# Patient Record
Sex: Male | Born: 1950 | Race: White | Hispanic: No | Marital: Single | Smoking: Former smoker
Health system: Southern US, Community
[De-identification: ages and names within clinical notes are randomized; demographics above are authoritative.]

## PROBLEM LIST (undated history)

## (undated) DIAGNOSIS — I4891 Unspecified atrial fibrillation: Secondary | ICD-10-CM

## (undated) DIAGNOSIS — I745 Embolism and thrombosis of iliac artery: Secondary | ICD-10-CM

## (undated) DIAGNOSIS — IMO0001 Reserved for inherently not codable concepts without codable children: Secondary | ICD-10-CM

## (undated) DIAGNOSIS — I10 Essential (primary) hypertension: Secondary | ICD-10-CM

## (undated) DIAGNOSIS — E785 Hyperlipidemia, unspecified: Secondary | ICD-10-CM

## (undated) DIAGNOSIS — I456 Pre-excitation syndrome: Secondary | ICD-10-CM

## (undated) DIAGNOSIS — Z720 Tobacco use: Secondary | ICD-10-CM

## (undated) DIAGNOSIS — C3490 Malignant neoplasm of unspecified part of unspecified bronchus or lung: Secondary | ICD-10-CM

## (undated) DIAGNOSIS — M3390 Dermatopolymyositis, unspecified, organ involvement unspecified: Secondary | ICD-10-CM

## (undated) DIAGNOSIS — E119 Type 2 diabetes mellitus without complications: Secondary | ICD-10-CM

## (undated) DIAGNOSIS — M3313 Other dermatomyositis without myopathy: Secondary | ICD-10-CM

## (undated) DIAGNOSIS — I209 Angina pectoris, unspecified: Secondary | ICD-10-CM

## (undated) DIAGNOSIS — I2510 Atherosclerotic heart disease of native coronary artery without angina pectoris: Secondary | ICD-10-CM

## (undated) DIAGNOSIS — B159 Hepatitis A without hepatic coma: Secondary | ICD-10-CM

## (undated) HISTORY — DX: Dermatopolymyositis, unspecified, organ involvement unspecified: M33.90

## (undated) HISTORY — DX: Pre-excitation syndrome: I45.6

## (undated) HISTORY — PX: FRACTURE SURGERY: SHX138

## (undated) HISTORY — DX: Hyperlipidemia, unspecified: E78.5

## (undated) HISTORY — PX: CATARACT EXTRACTION W/ INTRAOCULAR LENS  IMPLANT, BILATERAL: SHX1307

## (undated) HISTORY — DX: Malignant neoplasm of unspecified part of unspecified bronchus or lung: C34.90

## (undated) HISTORY — DX: Other dermatomyositis without myopathy: M33.13

## (undated) HISTORY — PX: SMALL INTESTINE SURGERY: SHX150

## (undated) HISTORY — DX: Essential (primary) hypertension: I10

## (undated) HISTORY — PX: PERCUTANEOUS CORONARY ROTOBLATOR INTERVENTION (PCI-R): SHX6015

## (undated) HISTORY — PX: RADIOFREQUENCY ABLATION: SHX2290

## (undated) HISTORY — PX: OTHER SURGICAL HISTORY: SHX169

## (undated) HISTORY — DX: Atherosclerotic heart disease of native coronary artery without angina pectoris: I25.10

## (undated) HISTORY — DX: Tobacco use: Z72.0

## (undated) HISTORY — DX: Embolism and thrombosis of iliac artery: I74.5

## (undated) HISTORY — PX: COLON SURGERY: SHX602

## (undated) HISTORY — PX: HIP FRACTURE SURGERY: SHX118

## (undated) HISTORY — PX: CORONARY ANGIOPLASTY: SHX604

---

## 1970-10-01 DIAGNOSIS — B159 Hepatitis A without hepatic coma: Secondary | ICD-10-CM

## 1970-10-01 HISTORY — DX: Hepatitis a without hepatic coma: B15.9

## 2009-03-30 DIAGNOSIS — E785 Hyperlipidemia, unspecified: Secondary | ICD-10-CM

## 2009-03-30 DIAGNOSIS — I456 Pre-excitation syndrome: Secondary | ICD-10-CM | POA: Insufficient documentation

## 2009-03-30 DIAGNOSIS — E119 Type 2 diabetes mellitus without complications: Secondary | ICD-10-CM

## 2009-03-30 DIAGNOSIS — I2510 Atherosclerotic heart disease of native coronary artery without angina pectoris: Secondary | ICD-10-CM | POA: Insufficient documentation

## 2009-03-31 ENCOUNTER — Ambulatory Visit: Payer: Self-pay | Admitting: Cardiology

## 2009-04-01 ENCOUNTER — Encounter: Payer: Self-pay | Admitting: Cardiology

## 2009-04-08 ENCOUNTER — Telehealth: Payer: Self-pay | Admitting: Cardiology

## 2009-04-12 ENCOUNTER — Telehealth (INDEPENDENT_AMBULATORY_CARE_PROVIDER_SITE_OTHER): Payer: Self-pay | Admitting: Radiology

## 2009-04-13 ENCOUNTER — Encounter: Payer: Self-pay | Admitting: Cardiology

## 2009-04-13 ENCOUNTER — Ambulatory Visit: Payer: Self-pay | Admitting: Cardiology

## 2009-04-13 ENCOUNTER — Ambulatory Visit: Payer: Self-pay

## 2009-04-13 ENCOUNTER — Telehealth: Payer: Self-pay | Admitting: Cardiology

## 2009-04-13 LAB — CONVERTED CEMR LAB
AST: 52 units/L — ABNORMAL HIGH (ref 0–37)
Alkaline Phosphatase: 75 units/L (ref 39–117)
Bilirubin, Direct: 0 mg/dL (ref 0.0–0.3)
CO2: 27 meq/L (ref 19–32)
Calcium: 9.5 mg/dL (ref 8.4–10.5)
Creatinine, Ser: 1 mg/dL (ref 0.4–1.5)
Eosinophils Relative: 1.5 % (ref 0.0–5.0)
Glucose, Bld: 124 mg/dL — ABNORMAL HIGH (ref 70–99)
HCT: 40.5 % (ref 39.0–52.0)
HDL: 33.8 mg/dL — ABNORMAL LOW (ref >39.00)
Hemoglobin: 13.8 g/dL (ref 13.0–17.0)
Hgb A1c MFr Bld: 7.6 % — ABNORMAL HIGH (ref 4.6–6.5)
Lymphs Abs: 3.3 10*3/uL (ref 0.7–4.0)
Monocytes Relative: 6.7 % (ref 3.0–12.0)
Platelets: 241 10*3/uL (ref 150.0–400.0)
Total Bilirubin: 0.8 mg/dL (ref 0.3–1.2)
Total CHOL/HDL Ratio: 4
WBC: 8.7 10*3/uL (ref 4.5–10.5)

## 2009-04-14 ENCOUNTER — Ambulatory Visit: Payer: Self-pay | Admitting: Cardiology

## 2009-04-20 LAB — CONVERTED CEMR LAB
CO2: 28 meq/L (ref 19–32)
Chloride: 96 meq/L (ref 96–112)
Creatinine, Ser: 0.8 mg/dL (ref 0.4–1.5)
Potassium: 5.4 meq/L — ABNORMAL HIGH (ref 3.5–5.1)

## 2009-04-25 ENCOUNTER — Ambulatory Visit: Payer: Self-pay | Admitting: Cardiology

## 2009-04-26 LAB — CONVERTED CEMR LAB
BUN: 14 mg/dL (ref 6–23)
CO2: 28 meq/L (ref 19–32)
Chloride: 100 meq/L (ref 96–112)
GFR calc non Af Amer: 81.37 mL/min (ref >60)
Glucose, Bld: 133 mg/dL — ABNORMAL HIGH (ref 70–99)
Potassium: 4.6 meq/L (ref 3.5–5.1)

## 2009-04-27 ENCOUNTER — Encounter: Payer: Self-pay | Admitting: Internal Medicine

## 2009-06-02 ENCOUNTER — Ambulatory Visit: Payer: Self-pay | Admitting: Internal Medicine

## 2009-06-02 DIAGNOSIS — I10 Essential (primary) hypertension: Secondary | ICD-10-CM

## 2009-06-07 ENCOUNTER — Telehealth: Payer: Self-pay | Admitting: Cardiology

## 2009-07-28 ENCOUNTER — Encounter (INDEPENDENT_AMBULATORY_CARE_PROVIDER_SITE_OTHER): Payer: Self-pay | Admitting: *Deleted

## 2009-08-12 ENCOUNTER — Telehealth: Payer: Self-pay | Admitting: Cardiology

## 2009-10-26 ENCOUNTER — Telehealth: Payer: Self-pay | Admitting: Internal Medicine

## 2009-12-07 ENCOUNTER — Telehealth (INDEPENDENT_AMBULATORY_CARE_PROVIDER_SITE_OTHER): Payer: Self-pay | Admitting: *Deleted

## 2010-05-19 ENCOUNTER — Telehealth: Payer: Self-pay | Admitting: Cardiology

## 2010-10-31 NOTE — Progress Notes (Signed)
  Recieved ROI back through Mail from Pt. Mark Hurst  December 07, 2009 3:55 PM    Appended Document:  forwarded ROI over to [facility] for records to b copied and Mailed

## 2010-10-31 NOTE — Progress Notes (Signed)
Summary: Pt request call   lm to cb  Phone Note Call from Patient Call back at [phone]   Caller: Patient Summary of Call: Pt request call Initial call taken by: Judie Grieve,  May 19, 2010 12:56 PM  Follow-up for Phone Call        lm for pt to call back.  Sander Nephew, RN  Pt returning call Judie Grieve  May 19, 2010 3:39 PM  Additional Follow-up for Phone Call Additional follow up Details #1::        pt had ran out of lisinopril 40mg - needs new RX - send to [facility] on [address]. Additional Follow-up by: Charolotte Capuchin, RN,  May 24, 2010 1:48 PM    Prescriptions: LISINOPRIL 40 MG TABS (LISINOPRIL) daily  #30 Tablet x 11   Entered by:   Charolotte Capuchin, RN   Authorized by:   Rollene Rotunda, MD, [facility]   Signed by:   Charolotte Capuchin, RN on 05/24/2010   Method used:   Electronically to        [facility] [phone]* (retail)       [address]       [place], [place]  [address]       Ph: [phone]       Fax: [phone]   RxID:   [id number]

## 2010-10-31 NOTE — Progress Notes (Signed)
Summary: refill meds  Phone Note Refill Request Call back at Home Phone [phone] Message from:  Patient on October 26, 2009 12:04 PM  Refills Requested: Medication #1:  METFORMIN HCL 500 MG TABS 1 by mouth two times a day [facility] [address] [phone]   Method Requested: Fax to Local Pharmacy Initial call taken by: Lorne Skeens,  October 26, 2009 12:04 PM Caller: Patient Reason for Call: Talk to Nurse  Follow-up for Phone Call        The number listed and only other cantact for pt is [facility]. The pt has not  lived there in over 6 months.  Marrion Coy, CNA  October 27, 2009 9:40 AM  Follow-up by: Marrion Coy, CNA,  October 27, 2009 9:40 AM  Additional Follow-up for Phone Call Additional follow up Details #1::        Pt needs refills on metformin. Can you please refill it?? Marrion Coy, CNA  October 27, 2009 10:08 AM  Additional Follow-up by: Marrion Coy, CNA,  October 27, 2009 10:08 AM    Additional Follow-up for Phone Call Additional follow up Details #2::    ok for [place] (PCP) to fill - thanks Greta Doom MD  October 27, 2009 10:50 AM   Additional Follow-up for Phone Call Additional follow up Details #3:: Details for Additional Follow-up Action Taken: [facility] Additional Follow-up by: Orlan Leavens,  October 27, 2009 11:46 AM  Prescriptions: METFORMIN HCL 500 MG TABS (METFORMIN HCL) 1 by mouth two times a day  #60 x 5   Entered by:   Orlan Leavens   Authorized by:   Newt Lukes MD   Signed by:   Orlan Leavens on 10/27/2009   Method used:   Electronically to        [facility] [phone]* (retail)       [address] [place], [place]  [address]       Ph: [phone] or [phone]       Fax: [phone]   RxID:   [id number]

## 2011-01-23 ENCOUNTER — Other Ambulatory Visit: Payer: Self-pay | Admitting: Cardiology

## 2011-01-23 DIAGNOSIS — I10 Essential (primary) hypertension: Secondary | ICD-10-CM

## 2011-01-23 MED ORDER — ATENOLOL 50 MG PO TABS: 50.0000 mg | ORAL_TABLET | Freq: Every day | ORAL | Status: DC

## 2011-01-23 NOTE — Telephone Encounter (Signed)
Pt. Last saw Dr. Antoine Poche in July 2010 and has not scheduled follow up appts. Pt will need follow up appt prior to refilling meds.  I attempted to call pt at number listed but was unable to reach him.  Number is for [facility] and there was no answer on phone for room listed for him.  Will continue to try and reach pt.

## 2011-01-29 ENCOUNTER — Telehealth: Payer: Self-pay | Admitting: *Deleted

## 2011-01-29 NOTE — Telephone Encounter (Signed)
Left message for pt to call back to schedule for labs

## 2011-01-29 NOTE — Telephone Encounter (Signed)
Pt needs a fasting lipid profile BMP and Ha1C per Dr Antoine Poche

## 2011-02-01 ENCOUNTER — Other Ambulatory Visit (INDEPENDENT_AMBULATORY_CARE_PROVIDER_SITE_OTHER): Payer: Medicare Other | Admitting: *Deleted

## 2011-02-01 DIAGNOSIS — E785 Hyperlipidemia, unspecified: Secondary | ICD-10-CM

## 2011-02-01 DIAGNOSIS — E119 Type 2 diabetes mellitus without complications: Secondary | ICD-10-CM

## 2011-02-01 DIAGNOSIS — I2510 Atherosclerotic heart disease of native coronary artery without angina pectoris: Secondary | ICD-10-CM

## 2011-02-01 LAB — LIPID PANEL
LDL Cholesterol: 70 mg/dL (ref 0–99)
Total CHOL/HDL Ratio: 3
VLDL: 16.4 mg/dL (ref 0.0–40.0)

## 2011-02-01 LAB — BASIC METABOLIC PANEL
CO2: 25 mEq/L (ref 19–32)
Glucose, Bld: 116 mg/dL — ABNORMAL HIGH (ref 70–99)
Potassium: 4.6 mEq/L (ref 3.5–5.1)
Sodium: 132 mEq/L — ABNORMAL LOW (ref 135–145)

## 2011-02-02 ENCOUNTER — Other Ambulatory Visit: Payer: Self-pay | Admitting: *Deleted

## 2011-02-28 ENCOUNTER — Encounter: Payer: Self-pay | Admitting: Cardiology

## 2011-03-15 ENCOUNTER — Ambulatory Visit: Payer: Self-pay | Admitting: Cardiology

## 2011-03-31 ENCOUNTER — Other Ambulatory Visit: Payer: Self-pay | Admitting: Cardiology

## 2011-06-06 ENCOUNTER — Other Ambulatory Visit: Payer: Self-pay | Admitting: Cardiology

## 2011-07-28 ENCOUNTER — Other Ambulatory Visit: Payer: Self-pay | Admitting: Cardiology

## 2014-10-26 ENCOUNTER — Encounter: Payer: Self-pay | Admitting: Cardiology

## 2015-03-03 ENCOUNTER — Emergency Department (HOSPITAL_COMMUNITY): Payer: Medicare Other

## 2015-03-03 ENCOUNTER — Encounter (HOSPITAL_COMMUNITY): Payer: Self-pay | Admitting: *Deleted

## 2015-03-03 ENCOUNTER — Inpatient Hospital Stay (HOSPITAL_COMMUNITY)
Admission: EM | Admit: 2015-03-03 | Discharge: 2015-03-04 | DRG: 313 | Disposition: A | Discharge status: Home / Self Care | Payer: Medicare Other | Attending: Internal Medicine | Admitting: Internal Medicine

## 2015-03-03 DIAGNOSIS — E785 Hyperlipidemia, unspecified: Secondary | ICD-10-CM | POA: Diagnosis present

## 2015-03-03 DIAGNOSIS — R0789 Other chest pain: Secondary | ICD-10-CM | POA: Diagnosis present

## 2015-03-03 DIAGNOSIS — R911 Solitary pulmonary nodule: Secondary | ICD-10-CM | POA: Insufficient documentation

## 2015-03-03 DIAGNOSIS — J438 Other emphysema: Secondary | ICD-10-CM | POA: Diagnosis not present

## 2015-03-03 DIAGNOSIS — Z720 Tobacco use: Secondary | ICD-10-CM | POA: Insufficient documentation

## 2015-03-03 DIAGNOSIS — Z9842 Cataract extraction status, left eye: Secondary | ICD-10-CM | POA: Diagnosis not present

## 2015-03-03 DIAGNOSIS — Z7982 Long term (current) use of aspirin: Secondary | ICD-10-CM

## 2015-03-03 DIAGNOSIS — R918 Other nonspecific abnormal finding of lung field: Secondary | ICD-10-CM | POA: Diagnosis not present

## 2015-03-03 DIAGNOSIS — I2510 Atherosclerotic heart disease of native coronary artery without angina pectoris: Secondary | ICD-10-CM | POA: Diagnosis present

## 2015-03-03 DIAGNOSIS — Z961 Presence of intraocular lens: Secondary | ICD-10-CM | POA: Diagnosis present

## 2015-03-03 DIAGNOSIS — R079 Chest pain, unspecified: Secondary | ICD-10-CM | POA: Diagnosis present

## 2015-03-03 DIAGNOSIS — E119 Type 2 diabetes mellitus without complications: Secondary | ICD-10-CM

## 2015-03-03 DIAGNOSIS — I10 Essential (primary) hypertension: Secondary | ICD-10-CM | POA: Diagnosis not present

## 2015-03-03 DIAGNOSIS — R0902 Hypoxemia: Secondary | ICD-10-CM | POA: Diagnosis not present

## 2015-03-03 DIAGNOSIS — E871 Hypo-osmolality and hyponatremia: Secondary | ICD-10-CM | POA: Diagnosis not present

## 2015-03-03 DIAGNOSIS — I745 Embolism and thrombosis of iliac artery: Secondary | ICD-10-CM | POA: Diagnosis present

## 2015-03-03 DIAGNOSIS — Z9841 Cataract extraction status, right eye: Secondary | ICD-10-CM

## 2015-03-03 DIAGNOSIS — F17210 Nicotine dependence, cigarettes, uncomplicated: Secondary | ICD-10-CM | POA: Diagnosis present

## 2015-03-03 DIAGNOSIS — Z955 Presence of coronary angioplasty implant and graft: Secondary | ICD-10-CM

## 2015-03-03 DIAGNOSIS — E875 Hyperkalemia: Secondary | ICD-10-CM | POA: Diagnosis present

## 2015-03-03 DIAGNOSIS — Z79899 Other long term (current) drug therapy: Secondary | ICD-10-CM

## 2015-03-03 DIAGNOSIS — J449 Chronic obstructive pulmonary disease, unspecified: Secondary | ICD-10-CM | POA: Diagnosis present

## 2015-03-03 DIAGNOSIS — I252 Old myocardial infarction: Secondary | ICD-10-CM

## 2015-03-03 DIAGNOSIS — I25118 Atherosclerotic heart disease of native coronary artery with other forms of angina pectoris: Secondary | ICD-10-CM | POA: Diagnosis not present

## 2015-03-03 DIAGNOSIS — I456 Pre-excitation syndrome: Secondary | ICD-10-CM | POA: Diagnosis present

## 2015-03-03 DIAGNOSIS — I739 Peripheral vascular disease, unspecified: Secondary | ICD-10-CM

## 2015-03-03 HISTORY — DX: Angina pectoris, unspecified: I20.9

## 2015-03-03 HISTORY — DX: Hepatitis a without hepatic coma: B15.9

## 2015-03-03 HISTORY — DX: Type 2 diabetes mellitus without complications: E11.9

## 2015-03-03 LAB — TROPONIN I: Troponin I: <0.03 ng/mL (ref <0.031)

## 2015-03-03 LAB — CBC
HEMATOCRIT: 37.6 % — AB (ref 39.0–52.0)
Hemoglobin: 13.2 g/dL (ref 13.0–17.0)
MCH: 31.1 pg (ref 26.0–34.0)
MCHC: 35.1 g/dL (ref 30.0–36.0)
MCV: 88.7 fL (ref 78.0–100.0)
PLATELETS: 250 10*3/uL (ref 150–400)
RBC: 4.24 MIL/uL (ref 4.22–5.81)
RDW: 12.6 % (ref 11.5–15.5)
WBC: 7.6 10*3/uL (ref 4.0–10.5)

## 2015-03-03 LAB — GLUCOSE, CAPILLARY
Glucose-Capillary: 279 mg/dL — ABNORMAL HIGH (ref 65–99)
Glucose-Capillary: 279 mg/dL — ABNORMAL HIGH (ref 65–99)

## 2015-03-03 LAB — COMPREHENSIVE METABOLIC PANEL
ALK PHOS: 80 U/L (ref 38–126)
ALT: 19 U/L (ref 17–63)
ANION GAP: 8 (ref 5–15)
AST: 21 U/L (ref 15–41)
Albumin: 3.5 g/dL (ref 3.5–5.0)
BILIRUBIN TOTAL: 0.4 mg/dL (ref 0.3–1.2)
BUN: 11 mg/dL (ref 6–20)
CALCIUM: 9.3 mg/dL (ref 8.9–10.3)
CHLORIDE: 92 mmol/L — AB (ref 101–111)
CO2: 26 mmol/L (ref 22–32)
Creatinine, Ser: 0.88 mg/dL (ref 0.61–1.24)
GFR calc Af Amer: >60 mL/min (ref >60)
GLUCOSE: 193 mg/dL — AB (ref 65–99)
Potassium: 5.3 mmol/L — ABNORMAL HIGH (ref 3.5–5.1)
Sodium: 126 mmol/L — ABNORMAL LOW (ref 135–145)
Total Protein: 6.7 g/dL (ref 6.5–8.1)

## 2015-03-03 LAB — PROTIME-INR
INR: 1.03 (ref 0.00–1.49)
PROTHROMBIN TIME: 13.7 s (ref 11.6–15.2)

## 2015-03-03 LAB — CBG MONITORING, ED: GLUCOSE-CAPILLARY: 103 mg/dL — AB (ref 65–99)

## 2015-03-03 LAB — MAGNESIUM: Magnesium: 1.8 mg/dL (ref 1.7–2.4)

## 2015-03-03 MED ORDER — SODIUM CHLORIDE 0.9 % IV BOLUS (SEPSIS)
1000.0000 mL | Freq: Once | INTRAVENOUS | Status: AC
  Administered 2015-03-03: 1000 mL via INTRAVENOUS

## 2015-03-03 MED ORDER — OMEGA-3-ACID ETHYL ESTERS 1 G PO CAPS
1000.0000 mg | ORAL_CAPSULE | Freq: Two times a day (BID) | ORAL | Status: DC
  Administered 2015-03-03 – 2015-03-04 (×2): 1000 mg via ORAL
  Filled 2015-03-03 (×3): qty 1

## 2015-03-03 MED ORDER — SODIUM CHLORIDE 0.9 % IV SOLN: INTRAVENOUS | Status: DC

## 2015-03-03 MED ORDER — ACETAMINOPHEN 650 MG RE SUPP: 650.0000 mg | Freq: Four times a day (QID) | RECTAL | Status: DC | PRN

## 2015-03-03 MED ORDER — SODIUM CHLORIDE 0.9 % IJ SOLN
3.0000 mL | Freq: Two times a day (BID) | INTRAMUSCULAR | Status: DC
  Administered 2015-03-03: 3 mL via INTRAVENOUS

## 2015-03-03 MED ORDER — NYSTATIN 100000 UNIT/GM EX CREA
TOPICAL_CREAM | Freq: Two times a day (BID) | CUTANEOUS | Status: DC
  Administered 2015-03-03 – 2015-03-04 (×2): via TOPICAL
  Filled 2015-03-03: qty 15

## 2015-03-03 MED ORDER — NICOTINE 21 MG/24HR TD PT24
21.0000 mg | MEDICATED_PATCH | Freq: Every day | TRANSDERMAL | Status: DC
  Administered 2015-03-03 – 2015-03-04 (×2): 21 mg via TRANSDERMAL
  Filled 2015-03-03 (×2): qty 1

## 2015-03-03 MED ORDER — INSULIN GLARGINE 100 UNIT/ML ~~LOC~~ SOLN
10.0000 [IU] | Freq: Every day | SUBCUTANEOUS | Status: DC
  Administered 2015-03-03: 10 [IU] via SUBCUTANEOUS
  Filled 2015-03-03 (×2): qty 0.1

## 2015-03-03 MED ORDER — ASPIRIN 81 MG PO CHEW
81.0000 mg | CHEWABLE_TABLET | Freq: Every day | ORAL | Status: DC
  Administered 2015-03-03 – 2015-03-04 (×2): 81 mg via ORAL
  Filled 2015-03-03 (×3): qty 1

## 2015-03-03 MED ORDER — INSULIN ASPART 100 UNIT/ML ~~LOC~~ SOLN
3.0000 [IU] | Freq: Three times a day (TID) | SUBCUTANEOUS | Status: DC
  Administered 2015-03-04 (×3): 3 [IU] via SUBCUTANEOUS

## 2015-03-03 MED ORDER — ACETAMINOPHEN 325 MG PO TABS: 650.0000 mg | ORAL_TABLET | Freq: Four times a day (QID) | ORAL | Status: DC | PRN

## 2015-03-03 MED ORDER — BISACODYL 10 MG RE SUPP: 10.0000 mg | Freq: Every day | RECTAL | Status: DC | PRN

## 2015-03-03 MED ORDER — ASPIRIN 81 MG PO CHEW: 324.0000 mg | CHEWABLE_TABLET | Freq: Once | ORAL | Status: DC

## 2015-03-03 MED ORDER — SENNA 8.6 MG PO TABS
1.0000 | ORAL_TABLET | Freq: Two times a day (BID) | ORAL | Status: DC
  Administered 2015-03-03 – 2015-03-04 (×2): 8.6 mg via ORAL
  Filled 2015-03-03 (×4): qty 1

## 2015-03-03 MED ORDER — OXYCODONE HCL 5 MG PO TABS: 5.0000 mg | ORAL_TABLET | ORAL | Status: DC | PRN

## 2015-03-03 MED ORDER — FLEET ENEMA 7-19 GM/118ML RE ENEM
1.0000 | ENEMA | Freq: Once | RECTAL | Status: AC | PRN
  Filled 2015-03-03: qty 1

## 2015-03-03 MED ORDER — POLYETHYLENE GLYCOL 3350 17 G PO PACK
17.0000 g | PACK | Freq: Every day | ORAL | Status: DC | PRN
  Filled 2015-03-03: qty 1

## 2015-03-03 MED ORDER — PREDNISONE 20 MG PO TABS
60.0000 mg | ORAL_TABLET | ORAL | Status: AC
  Administered 2015-03-03: 60 mg via ORAL
  Filled 2015-03-03: qty 3

## 2015-03-03 MED ORDER — LISINOPRIL 20 MG PO TABS
20.0000 mg | ORAL_TABLET | Freq: Every day | ORAL | Status: DC
  Administered 2015-03-03 – 2015-03-04 (×2): 20 mg via ORAL
  Filled 2015-03-03 (×2): qty 1

## 2015-03-03 MED ORDER — IOHEXOL 300 MG/ML  SOLN
80.0000 mL | Freq: Once | INTRAMUSCULAR | Status: AC | PRN
  Administered 2015-03-03: 80 mL via INTRAVENOUS

## 2015-03-03 MED ORDER — HEPARIN SODIUM (PORCINE) 5000 UNIT/ML IJ SOLN
5000.0000 [IU] | Freq: Three times a day (TID) | INTRAMUSCULAR | Status: DC
  Administered 2015-03-03 – 2015-03-04 (×4): 5000 [IU] via SUBCUTANEOUS
  Filled 2015-03-03 (×6): qty 1

## 2015-03-03 MED ORDER — DOCUSATE SODIUM 100 MG PO CAPS
100.0000 mg | ORAL_CAPSULE | Freq: Two times a day (BID) | ORAL | Status: DC
  Administered 2015-03-03 – 2015-03-04 (×2): 100 mg via ORAL
  Filled 2015-03-03 (×4): qty 1

## 2015-03-03 MED ORDER — ONDANSETRON HCL 4 MG PO TABS: 4.0000 mg | ORAL_TABLET | Freq: Four times a day (QID) | ORAL | Status: DC | PRN

## 2015-03-03 MED ORDER — GI COCKTAIL ~~LOC~~
30.0000 mL | Freq: Two times a day (BID) | ORAL | Status: DC | PRN
  Filled 2015-03-03: qty 30

## 2015-03-03 MED ORDER — PANTOPRAZOLE SODIUM 40 MG PO TBEC
40.0000 mg | DELAYED_RELEASE_TABLET | Freq: Every day | ORAL | Status: DC
  Administered 2015-03-03 – 2015-03-04 (×2): 40 mg via ORAL
  Filled 2015-03-03: qty 1

## 2015-03-03 MED ORDER — LORAZEPAM 0.5 MG PO TABS
0.5000 mg | ORAL_TABLET | Freq: Two times a day (BID) | ORAL | Status: DC | PRN
  Administered 2015-03-03: 0.5 mg via ORAL
  Filled 2015-03-03: qty 1

## 2015-03-03 MED ORDER — METOPROLOL TARTRATE 25 MG PO TABS
25.0000 mg | ORAL_TABLET | Freq: Every day | ORAL | Status: DC
  Administered 2015-03-03 – 2015-03-04 (×2): 25 mg via ORAL
  Filled 2015-03-03 (×2): qty 1

## 2015-03-03 MED ORDER — ONDANSETRON HCL 4 MG/2ML IJ SOLN: 4.0000 mg | Freq: Four times a day (QID) | INTRAMUSCULAR | Status: DC | PRN

## 2015-03-03 MED ORDER — NITROGLYCERIN 0.4 MG SL SUBL: 0.4000 mg | SUBLINGUAL_TABLET | SUBLINGUAL | Status: DC | PRN

## 2015-03-03 MED ORDER — MORPHINE SULFATE 2 MG/ML IJ SOLN: 2.0000 mg | INTRAMUSCULAR | Status: DC | PRN

## 2015-03-03 MED ORDER — ATORVASTATIN CALCIUM 40 MG PO TABS
40.0000 mg | ORAL_TABLET | Freq: Every day | ORAL | Status: DC
  Administered 2015-03-03 – 2015-03-04 (×2): 40 mg via ORAL
  Filled 2015-03-03 (×2): qty 1

## 2015-03-03 MED ORDER — ALUM & MAG HYDROXIDE-SIMETH 200-200-20 MG/5ML PO SUSP: 30.0000 mL | Freq: Four times a day (QID) | ORAL | Status: DC | PRN

## 2015-03-03 MED ORDER — SODIUM CHLORIDE 0.9 % IV SOLN
INTRAVENOUS | Status: AC
  Administered 2015-03-03: 15:00:00 via INTRAVENOUS

## 2015-03-03 MED ORDER — INSULIN ASPART 100 UNIT/ML ~~LOC~~ SOLN
0.0000 [IU] | Freq: Three times a day (TID) | SUBCUTANEOUS | Status: DC
  Administered 2015-03-04 (×3): 2 [IU] via SUBCUTANEOUS

## 2015-03-03 NOTE — H&P (Signed)
[facility] History and Physical  SOLLY DERASMO JXB:[id number] DOB: 1951-07-02 DOA: 03/03/2015  Referring physician: Dr. Vanita Panda PCP: [facility]  Chief Complaint: Chest pain  HPI: Mark Hurst is a 64 y.o. male with a history of HTN, DM2, HLD, CAD, tobacco abuse, WPW with ablation in 1997, and a previous AMI presents today with chest pain. He has had waxing and waning chest pain the last 1.5 weeks. Each episode previous to this morning responded to sublingual nitroglycerin. The current episode of chest pain began at 5am and was described as squeezing and pressure with palpitations. These symptoms occur every morning after he drinks his coffee, two cokes, cereal, and attempts defecation while constipated. The pressure and pain was greater on his left side. He took 8 sublingual nitroglycerin tablets this am without relief. The pain subsided once in the ambulance. Patients states he has bad circulation, blocked iliac artery, and occasional productive cough with brown sputum. He denies dizziness, DOE, dysphagia, fevers, abdominal pain, dysuria, and PND.   In the ER CT chest showed multiple masses up to 2 cm in size.  Mild hyponatremia (126), Mild hyperkalemia (5.3), and an elevated glucose.  TRH will admit to a telemetry bed for chest pain rule out and pulmonary consultation for lung masses.   Review of Systems:  Constitutional: No Fevers  HEENT: No headaches, Difficulty swallowing,Tooth/dental problems,Sore throat,  No sneezing, itching, ear ache, nasal congestion, post nasal drip,  Cardio-vascular: ++chest pain and Palpitations.  He denies DOE and PND GI: No heartburn, indigestion, abdominal pain, nausea, vomiting, diarrhea, change in bowel habits, loss of appetite.  ++Constipation Resp:  No shortness of breath with exertion or at rest. No excess mucus, ++ productive cough with brown sputum,  No coughing up of blood.No change in color of mucus.No wheezing.No chest wall deformity    Skin: no rash or lesions.  GU: no dysuria, change in color of urine, no urgency or frequency. No flank pain.  Musculoskeletal: No joint pain or swelling. No decreased range of motion. No back pain.  Psych: No change in mood or affect. No depression or anxiety. No memory loss.   Past Medical History  Diagnosis Date  . WPW (Wolff-Parkinson-White syndrome)     ablated  . DM (dermatomyositis)     x 10 years  . Hyperlipidemia     x 13 years  . Tobacco abuse   . CAD (coronary artery disease)     1997 LAD 95% stenosis. He had Rotablator of small  couple lesions in this astery. His last stress perfusion study was in 2001 with no evidence of ischemia.  . Iliac artery occlusion, right   . Hypertension     x 13 years  . Anginal pain   . Type II diabetes mellitus   . Hepatitis A 1972    "in [employer]"   Past Surgical History  Procedure Laterality Date  . Small intestine surgery  ~ 1976    following ingestion of a fish bone  . Fracture surgery    . Colon surgery    . Hip fracture surgery Right 1976?    "have steel plate put in"  . Percutaneous coronary rotoblator intervention (pci-r)  ~ 1998  . Coronary angioplasty  ~ 1998  . Cataract extraction w/ intraocular lens  implant, bilateral Bilateral ~ 2002  . Atrial ablation surgery      for WPW   Social History:  reports that he has been smoking Cigarettes.  He has a 94 pack-year  smoking history. He has never used smokeless tobacco. He reports that he drinks about 2.4 oz of alcohol per week. He reports that he does not use illicit drugs. Has been smoking since 64 y/o and smokes 2ppd. He states he only consumes 2 alcoholic beverages per week. He lives with a male friend who shops for him and helps him.   Allergies  Allergen Reactions  . Codeine Itching    Family History  Problem Relation Age of Onset  . Diabetes Mother   . Depression Mother   . Coronary artery disease Father 58  . Lung cancer Father   . Cancer Father   Father had lung  CA, surgically ressected, and is still living at 65 y/o.  Prior to Admission medications   Medication Sig Start Date End Date Taking? Authorizing Provider  aspirin 81 MG tablet Take 81 mg by mouth daily.      Historical Provider, MD  atenolol (TENORMIN) 50 MG tablet Take 50 mg by mouth daily.      Historical Provider, MD  atenolol (TENORMIN) 50 MG tablet take 1 tablet by mouth once daily 03/31/11   Minus Breeding, MD  B Complex-C (B-COMPLEX WITH VITAMIN C) tablet Take 1 tablet by mouth daily.      Historical Provider, MD  Fish Oil OIL as directed.      Historical Provider, MD  lisinopril (PRINIVIL,ZESTRIL) 40 MG tablet take 1 tablet by mouth once daily 07/28/11   Minus Breeding, MD  loratadine (CLARITIN) 10 MG tablet Take 10 mg by mouth daily.      Historical Provider, MD  metFORMIN (GLUCOPHAGE) 500 MG tablet Take 500 mg by mouth 2 (two) times daily with a meal.      Historical Provider, MD  simvastatin (ZOCOR) 80 MG tablet Take 80 mg by mouth at bedtime.      Historical Provider, MD   Physical Exam: Filed Vitals:   03/03/15 1300 03/03/15 1330 03/03/15 1345 03/03/15 1503  BP: 173/78 171/66 156/83 116/94  Pulse: 94 96  95  Temp:    97.3 F (36.3 C)  TempSrc:    Oral  Resp:    18  SpO2: 97% 97%  96%    Wt Readings from Last 3 Encounters:  06/02/09 62.324 kg (137 lb 6.4 oz)  04/13/09 62.596 kg (138 lb)  03/31/09 62.596 kg (138 lb)    General:  Appears calm and comfortable Eyes: normal lids, irises & conjunctiva ENT: grossly normal hearing, lips & tongue. Possible slight cyanosis on lips. Moist mucous membranes. 15 teeth left. Neck: no LAD, masses or thyromegaly Cardiovascular: RRR, no m/r/g. No LE edema. Telemetry: SR, no arrhythmias  Respiratory: CTA bilaterally, no w/r/r. Normal respiratory effort. Abdomen: distended, non tender to palpation, bruise on left lower abdomen from insulin injection. Skin: no rash or induration seen on limited exam Musculoskeletal: grossly normal  tone BUE/BLE. Full sensation b/l in feet. Psychiatric: grossly normal mood and affect, speech fluent and appropriate Neurologic: grossly non-focal. Extremeties: thin legs and arms           Labs on Admission:  Basic Metabolic Panel:  Recent Labs Lab 03/03/15 0952  NA 126*  K 5.3*  CL 92*  CO2 26  GLUCOSE 193*  BUN 11  CREATININE 0.88  CALCIUM 9.3  MG 1.8   Liver Function Tests:  Recent Labs Lab 03/03/15 0952  AST 21  ALT 19  ALKPHOS 80  BILITOT 0.4  PROT 6.7  ALBUMIN 3.5   CBC:  Recent  Labs Lab 03/03/15 0952  WBC 7.6  HGB 13.2  HCT 37.6*  MCV 88.7  PLT 250   Cardiac Enzymes:  Recent Labs Lab 03/03/15 0952  TROPONINI <0.03    CBG:  Recent Labs Lab 03/03/15 1236  GLUCAP 103*    Radiological Exams on Admission: Dg Chest 2 View  03/03/2015   CLINICAL DATA:  Acute chest pain.  EXAM: CHEST  2 VIEW  COMPARISON:  None.  FINDINGS: The heart size and mediastinal contours are within normal limits. No pneumothorax or pleural effusion is noted. Mild interstitial densities are noted throughout both lungs concerning for possible pulmonary edema or scarring. Possible nodular density is seen in right upper lobe. The visualized skeletal structures are unremarkable.  IMPRESSION: Mild interstitial densities are noted throughout both lungs which may represent pulmonary edema or possibly scarring. Small nodular density is noted in right upper lobe ; CT scan of the chest is recommended to evaluate for possible pulmonary nodule.   Electronically Signed   By: Marijo Conception, M.D.   On: 03/03/2015 09:34   Ct Chest W Contrast  03/03/2015   CLINICAL DATA:  Mid chest pain earlier today, now resolved. Abnormal chest radiograph with question of small right upper lobe pulmonary nodule. History of smoking.  EXAM: CT CHEST WITH CONTRAST  TECHNIQUE: Multidetector CT imaging of the chest was performed during intravenous contrast administration.  CONTRAST:  47m OMNIPAQUE IOHEXOL 300  MG/ML  SOLN  COMPARISON:  Chest radiograph-earlier same day  FINDINGS: There is a spiculated approximately 2.6 x 2.2 x 2.6 cm nodule within the medial aspect of the right lung apex (representative axial image 6, series 203, coronal image 69, series 204) which is worrisome for a primary bronchogenic carcinoma.  There are multiple additional scattered bilateral punctate pulmonary nodules, the largest of which within the subpleural aspect the right lower lobe measures 1.6 x 1.2 cm (is 43, series 203) with additional dominant nodule within the right lower lobe measuring 0.7 cm in diameter (is 41), dominant nodule within the subpleural aspect of the right upper lobe measuring 0.6 cm in diameter and dominant nodule within the left lower lobe measuring approximately 0.8 cm (image 37, series 203).  Borderline enlarged right suprahilar lymph node measures approximately 1.1 cm in greatest short axis diameter (image 29, series 201) and borderline enlarged high right pretracheal lymph node measures approximately 1.1 cm in diameter (image 15, series 201). Additional scattered shotty mediastinal lymph nodes are individually not enlarged by size criteria with index pretracheal lymph node measuring 0.6 cm (image 18) an index precarinal lymph node measuring 0.9 cm). No axillary lymphadenopathy.  Advanced predominantly centrilobular emphysematous change. Minimal dependent subpleural ground-glass opacities, right greater than left, likely atelectasis or scar. No discrete focal airspace opacities. No air bronchograms. No pleural effusion or pneumothorax. The central pulmonary airways appear widely patent.  Normal heart size.  Coronary artery calcifications.  Although this examination was not tailored for the evaluation of the pulmonary arteries, there are no discrete filling defects within the central pulmonary arterial tree to suggest central pulmonary embolism.  Moderate to large amount of mixed calcified and noncalcified  atherosclerotic plaque within a mildly tortuous but normal caliber thoracic aorta. No definite thoracic aortic dissection on this nongated examination. Conventional configuration of the aortic arch. There is a suspected hemodynamically significant narrowing involving the origin of the left common carotid artery (image 18, series 201).  Limited early arterial phase evaluation of the upper abdomen demonstrates a moderate to large amount  of eccentric mixed calcified and noncalcified atherosclerotic plaque within in aneurysmal infrarenal abdominal aorta measuring at least 3.3 cm in maximal diameter (image 72, series 201).  No acute or aggressive osseous abnormalities.  Regional soft tissues appear normal. Normal appearance of the thyroid gland.  IMPRESSION: 1. Advanced centrilobular emphysematous change with an approximately 2.6 cm spiculated nodule within the medial aspect of the right lung apex worrisome for bronchogenic carcinoma. Borderline enlarged right suprahilar and high pretracheal lymph nodes, nonspecific though worrisome for metastatic disease. Further evaluation with PET imaging is recommended. 2. Additional scattered bilateral indeterminate punctate pulmonary nodules, the largest of which within the right lower lobe measures 1.6 cm in diameter, nonspecific though several of which may be further evaluated at the time of PET imaging. 3. Coronary artery calcifications. 4. Moderate to large amount of mixed calcified and noncalcified atherosclerotic plaque within a normal caliber thoracic aorta. Suspected hemodynamically significant narrowing involving the origin of the left common carotid artery. 5. Moderate to large amount of mixed calcified and noncalcified atherosclerotic plaque within a mildly aneurysmal abdominal aorta measuring approximately 3.3 cm in diameter, incompletely imaged. Further evaluation with nonemergent CTA of the abdomen and pelvis is recommended.   Electronically Signed   By: Sandi Mariscal  M.D.   On: 03/03/2015 12:13    EKG: Independently reviewed. Discussed EKG with cardiology PA who agreed that there were no delta waves and a cardiology consult was not necessary based on this ECG.  Assessment/Plan Principal Problem:   Chest pain Active Problems:   Diabetes   TOBACCO ABUSE   Essential hypertension   WOLFF (WOLFE)-PARKINSON-WHITE (WPW) SYNDROME   Hyponatremia   CAD (coronary artery disease)   Lung mass   Atypical Chest pain with palpitations Uncertain etiology. May be related to anxiety, caffeine, constipation, possibly heart disease. Will cycle troponin to rule out ACS. First troponin negative. EKG does not show ST elevation or depression. Chest pain currently resolved.  Will order when necessary sublingual nitroglycerin, GI cocktail. Start on PPI.  Continue metoprolol, ACE-I, and ASA 81 mg.  Lung Masses Estimated 75-pack-year smoking history   pulmonary consulted For evaluation and potential biopsy  DM On metformin at home.    Carb mod diet.  Placed SSI novolog with meal coverage in house. Check Hgb A1C  Hyponatremia Potentially from lung masses.  Will give gentle IVF for 12 hours to see if it improves.  Tobacco abuse Nicotine patch.  Cessation counseling.  Consultants:  Pulmonary Critical Care.   Code Status: full code  DVT Prophylaxis: Heparin Family Communication: no family spoken to patient is alert and orientated and understands his plan of care. Disposition Plan:  To home in the next 48 - 72 hours.  Time spent: 60 minutes.  Eula Flax, PA-S Imogene Burn, [place] [facility] Pager [phone]

## 2015-03-03 NOTE — Consult Note (Signed)
Name: Mark Hurst MRN: [id number] DOB: 1951-01-10    ADMISSION DATE:  03/03/2015 CONSULTATION DATE:  03/03/2015  REFERRING MD :  Dyann Kief  CHIEF COMPLAINT:  Lung lesions  BRIEF PATIENT DESCRIPTION: 64 year old male who presented to [facility] 6/2 complaining of chest pain without relief from nitroglycerine. CT chest was ordered and showed several pulmonary nodules. PCCM consulted for tissue sampling.   SIGNIFICANT EVENTS    STUDIES:  CT chest 6/2 > Advanced centrilobular emphysematous change with an approximately 2.6 cm spiculated nodule within the medial aspect of the right lung apex worrisome for bronchogenic carcinoma. Borderline enlarged right suprahilar and high pretracheal lymph nodes.Additional scattered bilateral indeterminate punctate pulmonary nodules, the largest of which within the right lower lobe measures 1.6 cm in diameter.  Moderate to large amount of mixed calcified and noncalcified atherosclerotic plaque within a normal caliber thoracic aorta. Suspected hemodynamically significant narrowing involving the origin of the left common carotid artery. Moderate to large amount of mixed calcified and noncalcified atherosclerotic plaque within a mildly aneurysmal abdominal aorta measuring approximately 3.3 cm in diameter, incompletely imaged.   HISTORY OF PRESENT ILLNESS:  64 year old male, smoker, with PMH of WPW syndrome with ablation in 1997, dermatomyositis, hyperlipidemia, CAD, Iliac areter occlusion, HTN, and DM. He has had intermittent chest pain for the past 1-2 weeks. Over that time period, CP has been resolved with sublingual nitroglycerine. Pain was described as pressure with palpitations which occurr routinely during morning routine and straining with BM. 6/2 pain happened again during same routine, however, it was not resolved with nitroglycerine. He presented to ED with these complaints. CT scan was obtained in ED which showed several lesions, pulmonary nodules. Admitted to [facility], PCCM  to consult for further eval. 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 It was not uncommon to find insects, rodents, and bird/chicken droppings in these areas. He also used pesticides while down there and did not always wear mask.   PAST MEDICAL HISTORY :   has a past medical history of WPW (Wolff-Parkinson-White syndrome); DM (dermatomyositis); Hyperlipidemia; Tobacco abuse; CAD (coronary artery disease); Iliac artery occlusion, right; Hypertension; and Diabetes mellitus without complication.  has past surgical history that includes Small intestine surgery; Fracture surgery; Colon surgery; and Hip fracture surgery (Right, 1976?). Prior to Admission medications   Medication Sig Start Date End Date Taking? Authorizing Provider  aspirin 81 MG tablet Take 81 mg by mouth daily.     Yes Historical Provider, MD  atorvastatin (LIPITOR) 80 MG tablet Take 40 mg by mouth daily at 6 PM.   Yes Historical Provider, MD  bisacodyl (DULCOLAX) 5 MG EC tablet Take 5 mg by mouth daily as needed for mild constipation or moderate constipation.   Yes Historical Provider, MD  Cholecalciferol 2000 UNITS TABS Take 2,000 Units by mouth daily.   Yes Historical Provider, MD  Fish Oil OIL Take 1,000 mg by mouth 2 (two) times daily.    Yes Historical Provider, MD  lisinopril (PRINIVIL,ZESTRIL) 40 MG tablet take 1 tablet by mouth once daily Patient taking differently: Take 20 mg daily. 07/28/11  Yes Minus Breeding, MD  loratadine (CLARITIN) 10 MG tablet Take 10 mg by mouth daily.     Yes Historical Provider, MD  metFORMIN (GLUCOPHAGE) 500 MG tablet Take 1,000 mg by mouth 2 (two) times daily with a meal.  Yes Historical Provider, MD  metoprolol (LOPRESSOR) 50 MG tablet Take 25 mg by mouth daily.   Yes Historical Provider, MD  Multiple Vitamin (MULTIVITAMIN) tablet Take 1 tablet by mouth daily.   Yes  Historical Provider, MD  atenolol (TENORMIN) 50 MG tablet take 1 tablet by mouth once daily Patient not taking: Reported on 03/03/2015 03/31/11   Minus Breeding, MD   Allergies  Allergen Reactions  . Codeine Itching    FAMILY HISTORY:  family history includes Cancer in his father; Coronary artery disease (age of onset: 76) in his father; Depression in his mother; Diabetes in his mother; Lung cancer in his father. SOCIAL HISTORY:  reports that he has been smoking Cigarettes.  He has been smoking about 2.00 packs per day. He does not have any smokeless tobacco history on file. He reports that he drinks alcohol.  REVIEW OF SYSTEMS:   Bolds are positive  Constitutional: weight loss, gain, night sweats, Fevers, chills, fatigue .  HEENT: headaches, Sore throat, sneezing, nasal congestion, post nasal drip, Difficulty swallowing, Tooth/dental problems, visual complaints visual changes, ear ache CV:  chest pain, radiates: ,Orthopnea, PND, swelling in lower extremities, dizziness, palpitations, syncope.  GI  heartburn, indigestion, abdominal pain, nausea, vomiting, diarrhea, change in bowel habits, loss of appetite, bloody stools constipation Resp: cough, productive: , hemoptysis, dyspnea, chest pain, pleuritic.  Skin: rash or itching or icterus GU: dysuria, change in color of urine, urgency or frequency. flank pain, hematuria  MS: joint pain or swelling. decreased range of motion  Psych: change in mood or affect. depression or anxiety.  Neuro: difficulty with speech, weakness, numbness, ataxia   SUBJECTIVE:   VITAL SIGNS: Temp:  [97.3 F (36.3 C)-98.3 F (36.8 C)] 97.3 F (36.3 C) (06/02 1503) Pulse Rate:  [53-105] 95 (06/02 1503) Resp:  [13-26] 18 (06/02 1503) BP: (116-173)/(48-100) 116/94 mmHg (06/02 1503) SpO2:  [92 %-98 %] 96 % (06/02 1503)  PHYSICAL EXAMINATION: General:  Male of normal body habitus in NAD Neuro:  Alert,oriented, non-focal HEENT:  [place]/AT, no JVD noted,  PERRL Cardiovascular:  RRR, no MRG Lungs:  Clear bilateral breath sounds, no SOB Abdomen:  Soft, non-tender, non-distended Musculoskeletal:  No acute deformity or ROM limitation Skin:  Grossly intact   Recent Labs Lab 03/03/15 0952  NA 126*  K 5.3*  CL 92*  CO2 26  BUN 11  CREATININE 0.88  GLUCOSE 193*    Recent Labs Lab 03/03/15 0952  HGB 13.2  HCT 37.6*  WBC 7.6  PLT 250   Dg Chest 2 View  03/03/2015   CLINICAL DATA:  Acute chest pain.  EXAM: CHEST  2 VIEW  COMPARISON:  None.  FINDINGS: The heart size and mediastinal contours are within normal limits. No pneumothorax or pleural effusion is noted. Mild interstitial densities are noted throughout both lungs concerning for possible pulmonary edema or scarring. Possible nodular density is seen in right upper lobe. The visualized skeletal structures are unremarkable.  IMPRESSION: Mild interstitial densities are noted throughout both lungs which may represent pulmonary edema or possibly scarring. Small nodular density is noted in right upper lobe ; CT scan of the chest is recommended to evaluate for possible pulmonary nodule.   Electronically Signed   By: Marijo Conception, M.D.   On: 03/03/2015 09:34   Ct Chest W Contrast  03/03/2015   CLINICAL DATA:  Mid chest pain earlier today, now resolved. Abnormal chest radiograph with question of small right upper lobe pulmonary nodule. History of smoking.  EXAM: CT CHEST WITH CONTRAST  TECHNIQUE: Multidetector CT imaging of the chest was performed during intravenous contrast administration.  CONTRAST:  35m OMNIPAQUE IOHEXOL 300 MG/ML  SOLN  COMPARISON:  Chest radiograph-earlier same day  FINDINGS: There is a spiculated approximately 2.6 x 2.2 x 2.6 cm nodule within the medial aspect of the right lung apex (representative axial image 6, series 203, coronal image 69, series 204) which is worrisome for a primary bronchogenic carcinoma.  There are multiple additional scattered bilateral punctate  pulmonary nodules, the largest of which within the subpleural aspect the right lower lobe measures 1.6 x 1.2 cm (is 43, series 203) with additional dominant nodule within the right lower lobe measuring 0.7 cm in diameter (is 41), dominant nodule within the subpleural aspect of the right upper lobe measuring 0.6 cm in diameter and dominant nodule within the left lower lobe measuring approximately 0.8 cm (image 37, series 203).  Borderline enlarged right suprahilar lymph node measures approximately 1.1 cm in greatest short axis diameter (image 29, series 201) and borderline enlarged high right pretracheal lymph node measures approximately 1.1 cm in diameter (image 15, series 201). Additional scattered shotty mediastinal lymph nodes are individually not enlarged by size criteria with index pretracheal lymph node measuring 0.6 cm (image 18) an index precarinal lymph node measuring 0.9 cm). No axillary lymphadenopathy.  Advanced predominantly centrilobular emphysematous change. Minimal dependent subpleural ground-glass opacities, right greater than left, likely atelectasis or scar. No discrete focal airspace opacities. No air bronchograms. No pleural effusion or pneumothorax. The central pulmonary airways appear widely patent.  Normal heart size.  Coronary artery calcifications.  Although this examination was not tailored for the evaluation of the pulmonary arteries, there are no discrete filling defects within the central pulmonary arterial tree to suggest central pulmonary embolism.  Moderate to large amount of mixed calcified and noncalcified atherosclerotic plaque within a mildly tortuous but normal caliber thoracic aorta. No definite thoracic aortic dissection on this nongated examination. Conventional configuration of the aortic arch. There is a suspected hemodynamically significant narrowing involving the origin of the left common carotid artery (image 18, series 201).  Limited early arterial phase evaluation of  the upper abdomen demonstrates a moderate to large amount of eccentric mixed calcified and noncalcified atherosclerotic plaque within in aneurysmal infrarenal abdominal aorta measuring at least 3.3 cm in maximal diameter (image 72, series 201).  No acute or aggressive osseous abnormalities.  Regional soft tissues appear normal. Normal appearance of the thyroid gland.  IMPRESSION: 1. Advanced centrilobular emphysematous change with an approximately 2.6 cm spiculated nodule within the medial aspect of the right lung apex worrisome for bronchogenic carcinoma. Borderline enlarged right suprahilar and high pretracheal lymph nodes, nonspecific though worrisome for metastatic disease. Further evaluation with PET imaging is recommended. 2. Additional scattered bilateral indeterminate punctate pulmonary nodules, the largest of which within the right lower lobe measures 1.6 cm in diameter, nonspecific though several of which may be further evaluated at the time of PET imaging. 3. Coronary artery calcifications. 4. Moderate to large amount of mixed calcified and noncalcified atherosclerotic plaque within a normal caliber thoracic aorta. Suspected hemodynamically significant narrowing involving the origin of the left common carotid artery. 5. Moderate to large amount of mixed calcified and noncalcified atherosclerotic plaque within a mildly aneurysmal abdominal aorta measuring approximately 3.3 cm in diameter, incompletely imaged. Further evaluation with nonemergent CTA of the abdomen and pelvis is recommended.   Electronically Signed   By: JSandi MariscalM.D.   On: 03/03/2015  12:13    ASSESSMENT / PLAN:  RUL mass Multiple pulmonary nodules Tobacco abuse disorder ? New diagnosis COPD without evidence for acute exacerbation - No identifiable lesions/nodules/LAN that are easily amenable to bronchoscopic biopsy.  - Consider IR evaluation for lesion in posterior R lung.  - Will need pulmonary follow up with Dr. Lamonte Sakai as  outpatient after chest pain workup completed.  - Will need PFT as outpatient  Georgann Housekeeper, AGACNP-BC [place] [facility] Pager [phone] or [phone]  Attending Note:   64 year old male with 75 pack year history of smoking presenting with chest pain.  CTA done to r/o PE and multiple pulmonary nodules noted.  I reviewed the chest CT myself the only large one is at the apex of the right lung then a few smaller ones R>L and 3 small mediastinal lymph nodes.  Discussed with PCCM-NP and TRH.  Pulmonary nodule: Concern for metastatic lung cancer.  - No bronch with active chest pain.  - Cardiology to clear.  - Arrange for PET scan as outpatient.  - Has no pulmonologist so arrange for f/u as outpatient [place] [facility].  Tobacco abuse:  - Smoking cessation counseling.  - Nicotine patch.  Emphysema: due to smoking.  - Smoking cessation counseling.  - On no inhalers at home, will not start.  Hypoxemia: Suspect a COPD component.  - Supplemental O2 as needed.  - Titrate O2 for sat of 88-92%.  - Will need an ambulatory desaturation study prior to discharge to see if home O2 is needed.  Patient seen and examined, agree with above note.  I dictated the care and orders written for this patient under my direction.  Rush Farmer, MD [phone]  03/03/2015 4:36 PM

## 2015-03-03 NOTE — ED Provider Notes (Signed)
CSN: [id number]     Arrival date & time 03/03/15  0815 History   First MD Initiated Contact with Patient 03/03/15 [phone]     Chief Complaint  Patient presents with  . Chest Pain     (Consider location/radiation/quality/duration/timing/severity/associated sxs/prior Treatment) HPI Patient presents with concern of chest pain. He states that over the past week or so, he has had episodes, with regular frequency. Today's episode is both more severe, and required additional nitroglycerin for resolution. Each morning, typically at the end of defecation the patient has developed chest tightness, anteriorly, nonradiating, with associated dyspnea. Symptoms improve with nitroglycerin, and do not occur during the day. Today the patient had similar onset, but the pain persisted for almost 1 hour, and only improved after rest, and 8 tablets of nitroglycerin, which is unusual for him. Currently the patient has no chest pain, no dyspnea, no lightheadedness, no sig, no fever, no chills. Patient states that he takes on occasion as directed. He acknowledges a history of multiple cardiac events.  He smokes  Smoking cessation provided, particularly in light of this patient's evaluation in the ED.  Past Medical History  Diagnosis Date  . WPW (Wolff-Parkinson-White syndrome)     ablated  . DM (dermatomyositis)     x 10 years  . Hyperlipidemia     x 13 years  . Tobacco abuse   . CAD (coronary artery disease)     1997 LAD 95% stenosis. He had Rotablator of small  couple lesions in this astery. His last stress perfusion study was in 2001 with no evidence of ischemia.  . Iliac artery occlusion, right   . Hypertension     x 13 years  . Diabetes mellitus without complication    Past Surgical History  Procedure Laterality Date  . Small intestine surgery      following ingestion of a fish bone  . Repair right leg fracture     Family History  Problem Relation Age of Onset  . Diabetes Mother   .  Depression Mother   . Coronary artery disease Father 61  . Other      He does not have a family hx. of frist-degree relatives with early onset of heart disease, though his father has heart disease at a later age.   History  Substance Use Topics  . Smoking status: Current Every Day Smoker  . Smokeless tobacco: Not on file     Comment: He has been smoking 1 1/2 to 2 packs per day for 37+ yrs.  . Alcohol Use: Yes     Comment: occ.beer use.    Review of Systems  Constitutional:       Per HPI, otherwise negative  HENT:       Per HPI, otherwise negative  Respiratory:       Per HPI, otherwise negative  Cardiovascular:       Per HPI, otherwise negative  Gastrointestinal: Negative for vomiting.  Endocrine:       Negative aside from HPI  Genitourinary:       Neg aside from HPI   Musculoskeletal:       Per HPI, otherwise negative  Skin: Negative.   Neurological: Negative for syncope.      Allergies  Codeine  Home Medications   Prior to Admission medications   Medication Sig Start Date End Date Taking? Authorizing Provider  aspirin 81 MG tablet Take 81 mg by mouth daily.      Historical Provider, MD  atenolol (TENORMIN)  50 MG tablet Take 50 mg by mouth daily.      Historical Provider, MD  atenolol (TENORMIN) 50 MG tablet take 1 tablet by mouth once daily 03/31/11   Minus Breeding, MD  B Complex-C (B-COMPLEX WITH VITAMIN C) tablet Take 1 tablet by mouth daily.      Historical Provider, MD  Fish Oil OIL as directed.      Historical Provider, MD  lisinopril (PRINIVIL,ZESTRIL) 40 MG tablet take 1 tablet by mouth once daily 07/28/11   Minus Breeding, MD  loratadine (CLARITIN) 10 MG tablet Take 10 mg by mouth daily.      Historical Provider, MD  metFORMIN (GLUCOPHAGE) 500 MG tablet Take 500 mg by mouth 2 (two) times daily with a meal.      Historical Provider, MD  simvastatin (ZOCOR) 80 MG tablet Take 80 mg by mouth at bedtime.      Historical Provider, MD   BP 140/71 mmHg  Pulse  81  Temp(Src) 98.3 F (36.8 C) (Oral)  Resp 21  SpO2 94% Physical Exam  Constitutional: He is oriented to person, place, and time. He appears well-developed. No distress.  HENT:  Head: Normocephalic and atraumatic.  Eyes: Conjunctivae and EOM are normal.  Cardiovascular: Normal rate and regular rhythm.   Pulmonary/Chest: Effort normal. No stridor. No respiratory distress.  Abdominal: He exhibits no distension.  Musculoskeletal: He exhibits no edema.  Neurological: He is alert and oriented to person, place, and time.  Skin: Skin is warm and dry.  Psychiatric: His mood appears anxious.  Nursing note and vitals reviewed.   ED Course  Procedures (including critical care time) Labs Review Labs Reviewed  CBC - Abnormal; Notable for the following:    HCT 37.6 (*)    All other components within normal limits  PROTIME-INR  COMPREHENSIVE METABOLIC PANEL  MAGNESIUM  TROPONIN I    Imaging Review Dg Chest 2 View  03/03/2015   CLINICAL DATA:  Acute chest pain.  EXAM: CHEST  2 VIEW  COMPARISON:  None.  FINDINGS: The heart size and mediastinal contours are within normal limits. No pneumothorax or pleural effusion is noted. Mild interstitial densities are noted throughout both lungs concerning for possible pulmonary edema or scarring. Possible nodular density is seen in right upper lobe. The visualized skeletal structures are unremarkable.  IMPRESSION: Mild interstitial densities are noted throughout both lungs which may represent pulmonary edema or possibly scarring. Small nodular density is noted in right upper lobe ; CT scan of the chest is recommended to evaluate for possible pulmonary nodule.   Electronically Signed   By: Marijo Conception, M.D.   On: 03/03/2015 09:34   given the abnormal x-ray, the patient's description of new, unusual chest pain, CT scan will be performed.   EKG Interpretation   Date/Time:  Thursday March 03 2015 08:19:40 EDT Ventricular Rate:  106 PR Interval:  158 QRS  Duration: 88 QT Interval:  337 QTC Calculation: 447 R Axis:   64 Text Interpretation:  Sinus tachycardia Abnormal R-wave progression, late  transition Borderline repolarization abnormality Sinus tachycardia  Artifact hypertrophic changes Early repolarization pattern Abnormal ekg  Confirmed by Carmin Muskrat  MD ([id number]) on 03/03/2015 8:32:03 AM     Cardiac monitor 95, sinus, normal Pulse ox 99% room air normal  I reviewed the results (including imaging as performed), agree with the interpretation  On repeat exam the patient appears better.  We reviewed all findings.  Specifically we discussed the new lesions on CT scan  concerning for malignancy. We also discussed the likelihood of emphysema given the patient's smoking history, CT and x-ray findings.    MDM   Final diagnoses:  Atypical chest pain   Patient presents with new episodic atypical chest pain. Patient's description of pain that occurs after defecation is unusual, but the increased amounts of nitroglycerin required to suppress symptoms each day is concerning. Patient's initial cardiac evaluation is reassuring, but patient's history of long-term cigarette use, concern for undiagnosed pulmonary disease, lack of recent cardiac evaluation are all revealed additional evaluation. In addition, the patient has new lung masses, requiring additional evaluation. Patient was admitted for further evaluation and management.  Carmin Muskrat, MD 03/03/15 1616

## 2015-03-03 NOTE — Progress Notes (Signed)
Chaplain responded to request from pt's stepfather for a "protestant chaplain" visit pt in ED.  Pt awake and alert, reports "feeling better," and that the "chest pains have stopped."  Pt still has concern for what has caused these "chest pains."  Pt also shared that he might need help to stop smoking that he suspects his smoking might have something to do with current "chest pains."  Chaplain provided emotional and spiritual support and is available to follow up as needed.    03/03/15 0900  Clinical Encounter Type  Visited With Patient;Health care provider  Visit Type Initial;Spiritual support;ED  Referral From [facility]  Spiritual Encounters  Spiritual Needs Emotional  Stress Factors  Patient Stress Factors Health changes   Geralyn Flash 03/03/2015 9:20 AM

## 2015-03-03 NOTE — ED Notes (Signed)
Mother: Mark Hurst  [phone] [facility])  [place] ([facility]) [phone]

## 2015-03-03 NOTE — ED Notes (Signed)
Pt arrived by [facility] for chest pain x 1 hour. Took nitro and asa pta, pain relieved pta.

## 2015-03-04 ENCOUNTER — Inpatient Hospital Stay (HOSPITAL_COMMUNITY): Payer: Medicare Other

## 2015-03-04 DIAGNOSIS — I2510 Atherosclerotic heart disease of native coronary artery without angina pectoris: Secondary | ICD-10-CM

## 2015-03-04 DIAGNOSIS — I739 Peripheral vascular disease, unspecified: Secondary | ICD-10-CM

## 2015-03-04 DIAGNOSIS — R079 Chest pain, unspecified: Secondary | ICD-10-CM

## 2015-03-04 DIAGNOSIS — I25118 Atherosclerotic heart disease of native coronary artery with other forms of angina pectoris: Secondary | ICD-10-CM

## 2015-03-04 DIAGNOSIS — R918 Other nonspecific abnormal finding of lung field: Secondary | ICD-10-CM

## 2015-03-04 LAB — BASIC METABOLIC PANEL
ANION GAP: 10 (ref 5–15)
BUN: 10 mg/dL (ref 6–20)
CALCIUM: 8.6 mg/dL — AB (ref 8.9–10.3)
CHLORIDE: 96 mmol/L — AB (ref 101–111)
CO2: 21 mmol/L — AB (ref 22–32)
Creatinine, Ser: 0.82 mg/dL (ref 0.61–1.24)
GLUCOSE: 191 mg/dL — AB (ref 65–99)
Potassium: 4.7 mmol/L (ref 3.5–5.1)
Sodium: 127 mmol/L — ABNORMAL LOW (ref 135–145)

## 2015-03-04 LAB — CBC
HCT: 34.6 % — ABNORMAL LOW (ref 39.0–52.0)
Hemoglobin: 12.1 g/dL — ABNORMAL LOW (ref 13.0–17.0)
MCH: 30.9 pg (ref 26.0–34.0)
MCHC: 35 g/dL (ref 30.0–36.0)
MCV: 88.5 fL (ref 78.0–100.0)
Platelets: 248 10*3/uL (ref 150–400)
RBC: 3.91 MIL/uL — AB (ref 4.22–5.81)
RDW: 12.6 % (ref 11.5–15.5)
WBC: 7.6 10*3/uL (ref 4.0–10.5)

## 2015-03-04 LAB — NM MYOCAR MULTI W/SPECT W/WALL MOTION / EF
CHL CUP MPHR: 0 {beats}/min
CHL CUP RESTING HR STRESS: 69 {beats}/min
CSEPEW: 1 METS
Exercise duration (min): 0 min
Exercise duration (sec): 0 s
LV dias vol: 108 mL
LVSYSVOL: 46 mL
NUC STRESS EF: 57 %
Peak HR: 101 {beats}/min
Percent HR: 0 %
RPE: 0
SDS: 4

## 2015-03-04 LAB — GLUCOSE, CAPILLARY
Glucose-Capillary: 159 mg/dL — ABNORMAL HIGH (ref 65–99)
Glucose-Capillary: 177 mg/dL — ABNORMAL HIGH (ref 65–99)
Glucose-Capillary: 192 mg/dL — ABNORMAL HIGH (ref 65–99)

## 2015-03-04 LAB — HEMOGLOBIN A1C
HEMOGLOBIN A1C: 7.4 % — AB (ref 4.8–5.6)
Mean Plasma Glucose: 166 mg/dL

## 2015-03-04 LAB — TROPONIN I: Troponin I: <0.03 ng/mL (ref <0.031)

## 2015-03-04 MED ORDER — REGADENOSON 0.4 MG/5ML IV SOLN
0.4000 mg | Freq: Once | INTRAVENOUS | Status: AC
  Administered 2015-03-04: 0.4 mg via INTRAVENOUS
  Filled 2015-03-04: qty 5

## 2015-03-04 MED ORDER — TECHNETIUM TC 99M SESTAMIBI GENERIC - CARDIOLITE
10.0000 | Freq: Once | INTRAVENOUS | Status: AC | PRN
  Administered 2015-03-04: 10 via INTRAVENOUS

## 2015-03-04 MED ORDER — TECHNETIUM TC 99M SESTAMIBI - CARDIOLITE
30.0000 | Freq: Once | INTRAVENOUS | Status: AC | PRN
  Administered 2015-03-04: 30 via INTRAVENOUS

## 2015-03-04 MED ORDER — REGADENOSON 0.4 MG/5ML IV SOLN
INTRAVENOUS | Status: DC
Start: 2015-03-04 — End: 2015-03-04
  Filled 2015-03-04: qty 5

## 2015-03-04 NOTE — Discharge Instructions (Signed)
Follow with [name], MD in 5-7 days  Please get a complete blood count and chemistry panel checked by your Primary MD at your next visit, and again as instructed by your Primary MD. Please get your medications reviewed and adjusted by your Primary MD.  Please request your Primary MD to go over all Hospital Tests and Procedure/Radiological results at the follow up, please get all Hospital records sent to your Prim MD by signing hospital release before you go home.  If you had Pneumonia of Lung problems at the Hospital: Please get a 2 view Chest X ray done in 6-8 weeks after hospital discharge or sooner if instructed by your Primary MD.  If you have Congestive Heart Failure: Please call your Cardiologist or Primary MD anytime you have any of the following symptoms:  1) 3 pound weight gain in 24 hours or 5 pounds in 1 week  2) shortness of breath, with or without a dry hacking cough  3) swelling in the hands, feet or stomach  4) if you have to sleep on extra pillows at night in order to breathe  Follow cardiac low salt diet and 1.5 lit/day fluid restriction.  If you have diabetes Accuchecks 4 times/day, Once in AM empty stomach and then before each meal. Log in all results and show them to your primary doctor at your next visit. If any glucose reading is under 80 or above 300 call your primary MD immediately.  If you have Seizure/Convulsions/Epilepsy: Please do not drive, operate heavy machinery, participate in activities at heights or participate in high speed sports until you have seen by Primary MD or a Neurologist and advised to do so again.  If you had Gastrointestinal Bleeding: Please ask your Primary MD to check a complete blood count within one week of discharge or at your next visit. Your endoscopic/colonoscopic biopsies that are pending at the time of discharge, will also need to followed by your Primary MD.  Get Medicines reviewed and adjusted. Please take all your  medications with you for your next visit with your Primary MD  Please request your Primary MD to go over all hospital tests and procedure/radiological results at the follow up, please ask your Primary MD to get all Hospital records sent to his/her office.  If you experience worsening of your admission symptoms, develop shortness of breath, life threatening emergency, suicidal or homicidal thoughts you must seek medical attention immediately by calling 911 or calling your MD immediately  if symptoms less severe.  You must read complete instructions/literature along with all the possible adverse reactions/side effects for all the Medicines you take and that have been prescribed to you. Take any new Medicines after you have completely understood and accpet all the possible adverse reactions/side effects.   Do not drive or operate heavy machinery when taking Pain medications.   Do not take more than prescribed Pain, Sleep and Anxiety Medications  Special Instructions: If you have smoked or chewed Tobacco  in the last 2 yrs please stop smoking, stop any regular Alcohol  and or any Recreational drug use.  Wear Seat belts while driving.  Please note You were cared for by a hospitalist during your hospital stay. If you have any questions about your discharge medications or the care you received while you were in the hospital after you are discharged, you can call the unit and asked to speak with the hospitalist on call if the hospitalist that took care of you is not available. Once you  are discharged, your primary care physician will handle any further medical issues. Please note that NO REFILLS for any discharge medications will be authorized once you are discharged, as it is imperative that you return to your primary care physician (or establish a relationship with a primary care physician if you do not have one) for your aftercare needs so that they can reassess your need for medications and monitor your  lab values.  You can reach the hospitalist office at phone [phone] or fax [phone]   If you do not have a primary care physician, you can call [phone] for a physician referral.  Activity: As tolerated with Full fall precautions use walker/cane & assistance as needed  Diet: heart healthy  Disposition Home

## 2015-03-04 NOTE — Care Management Note (Addendum)
Case Management Note  Patient Details  Name: Mark Hurst MRN: [id number] Date of Birth: 1951/06/03  Subjective/Objective:     Pt was admitted with chest pain  Action/Plan:   Pt is independent from home.  CM will continue to monitor for disposition needs   Expected Discharge Date:                  Expected Discharge Plan:  Home/Self Care  In-House Referral:     Discharge planning Services  CM Consult  Post Acute Care Choice:    Choice offered to:     DME Arranged:    DME Agency:     HH Arranged:    HH Agency:     Status of Service:  In process, will continue to follow  Medicare Important Message Given:  Yes Date Medicare IM Given:  03/04/15 Medicare IM give by:  Elenor Quinones Date Additional Medicare IM Given:    Additional Medicare Important Message give by:     If discussed at [place] of Stay Meetings, dates discussed:    Additional Comments:  Maryclare Labrador, RN 03/04/2015, 11:16 AM

## 2015-03-04 NOTE — Progress Notes (Deleted)
PROGRESS NOTE  HAGAN VANAUKEN ZCH:[id number] DOB: [birth year] DOA: 03/03/2015 PCP: [name], MD  HPI: Mark Hurst is a 64 y.o. male with a history of HTN, DM2, HLD, CAD, tobacco abuse, WPW with ablation in 1997, and a previous AMI presented with chest pain  Subjective / 24 H Interval events - no chest pain this morning, no complaints, asks about when he could go home   Assessment/Plan: Principal Problem:   Chest pain Active Problems:   Diabetes   TOBACCO ABUSE   Essential hypertension   WOLFF (WOLFE)-PARKINSON-WHITE (WPW) SYNDROME   Hyponatremia   CAD (coronary artery disease)   Lung mass   Solitary pulmonary nodule   Hypoxemia   Tobacco abuse   Other emphysema   PAD (peripheral artery disease)   Chest pain - patient with known CAD, most workup at [facility] - chest pain intermittent at home relieved by NTG - cardiology consulted, stress test today   Lung Masses - Estimated 75-pack-year smoking history  -pulmonary consulted For evaluation and potential biopsy, difficult to do via bronch, may be better via IR  DM - On metformin at home.  - Carb mod diet. Placed SSI novolog with meal coverage in house. - Check Hgb A1C  Hyponatremia - Potentially from lung masses.  - slight improvement with fluids  Tobacco abuse - Nicotine patch. Cessation counseling.  Diet: Diet NPO time specified Fluids: NS DVT Prophylaxis: heparin  Code Status: Full Code Family Communication: no family bedside  Disposition Plan: home when ready   Consultants:  Cardiology   PCCM  Procedures:  Stress test pending   Antibiotics  Anti-infectives    None       Studies  Dg Chest 2 View  03/03/2015   CLINICAL DATA:  Acute chest pain.  EXAM: CHEST  2 VIEW  COMPARISON:  None.  FINDINGS: The heart size and mediastinal contours are within normal limits. No pneumothorax or pleural effusion is noted. Mild interstitial densities are noted throughout both lungs concerning for  possible pulmonary edema or scarring. Possible nodular density is seen in right upper lobe. The visualized skeletal structures are unremarkable.  IMPRESSION: Mild interstitial densities are noted throughout both lungs which may represent pulmonary edema or possibly scarring. Small nodular density is noted in right upper lobe ; CT scan of the chest is recommended to evaluate for possible pulmonary nodule.   Electronically Signed   By: Marijo Conception, M.D.   On: 03/03/2015 09:34   Ct Chest W Contrast  03/03/2015   CLINICAL DATA:  Mid chest pain earlier today, now resolved. Abnormal chest radiograph with question of small right upper lobe pulmonary nodule. History of smoking.  EXAM: CT CHEST WITH CONTRAST  TECHNIQUE: Multidetector CT imaging of the chest was performed during intravenous contrast administration.  CONTRAST:  59m OMNIPAQUE IOHEXOL 300 MG/ML  SOLN  COMPARISON:  Chest radiograph-earlier same day  FINDINGS: There is a spiculated approximately 2.6 x 2.2 x 2.6 cm nodule within the medial aspect of the right lung apex (representative axial image 6, series 203, coronal image 69, series 204) which is worrisome for a primary bronchogenic carcinoma.  There are multiple additional scattered bilateral punctate pulmonary nodules, the largest of which within the subpleural aspect the right lower lobe measures 1.6 x 1.2 cm (is 43, series 203) with additional dominant nodule within the right lower lobe measuring 0.7 cm in diameter (is 41), dominant nodule within the subpleural aspect of the right upper lobe measuring 0.6 cm in diameter  and dominant nodule within the left lower lobe measuring approximately 0.8 cm (image 37, series 203).  Borderline enlarged right suprahilar lymph node measures approximately 1.1 cm in greatest short axis diameter (image 29, series 201) and borderline enlarged high right pretracheal lymph node measures approximately 1.1 cm in diameter (image 15, series 201). Additional scattered shotty  mediastinal lymph nodes are individually not enlarged by size criteria with index pretracheal lymph node measuring 0.6 cm (image 18) an index precarinal lymph node measuring 0.9 cm). No axillary lymphadenopathy.  Advanced predominantly centrilobular emphysematous change. Minimal dependent subpleural ground-glass opacities, right greater than left, likely atelectasis or scar. No discrete focal airspace opacities. No air bronchograms. No pleural effusion or pneumothorax. The central pulmonary airways appear widely patent.  Normal heart size.  Coronary artery calcifications.  Although this examination was not tailored for the evaluation of the pulmonary arteries, there are no discrete filling defects within the central pulmonary arterial tree to suggest central pulmonary embolism.  Moderate to large amount of mixed calcified and noncalcified atherosclerotic plaque within a mildly tortuous but normal caliber thoracic aorta. No definite thoracic aortic dissection on this nongated examination. Conventional configuration of the aortic arch. There is a suspected hemodynamically significant narrowing involving the origin of the left common carotid artery (image 18, series 201).  Limited early arterial phase evaluation of the upper abdomen demonstrates a moderate to large amount of eccentric mixed calcified and noncalcified atherosclerotic plaque within in aneurysmal infrarenal abdominal aorta measuring at least 3.3 cm in maximal diameter (image 72, series 201).  No acute or aggressive osseous abnormalities.  Regional soft tissues appear normal. Normal appearance of the thyroid gland.  IMPRESSION: 1. Advanced centrilobular emphysematous change with an approximately 2.6 cm spiculated nodule within the medial aspect of the right lung apex worrisome for bronchogenic carcinoma. Borderline enlarged right suprahilar and high pretracheal lymph nodes, nonspecific though worrisome for metastatic disease. Further evaluation with PET  imaging is recommended. 2. Additional scattered bilateral indeterminate punctate pulmonary nodules, the largest of which within the right lower lobe measures 1.6 cm in diameter, nonspecific though several of which may be further evaluated at the time of PET imaging. 3. Coronary artery calcifications. 4. Moderate to large amount of mixed calcified and noncalcified atherosclerotic plaque within a normal caliber thoracic aorta. Suspected hemodynamically significant narrowing involving the origin of the left common carotid artery. 5. Moderate to large amount of mixed calcified and noncalcified atherosclerotic plaque within a mildly aneurysmal abdominal aorta measuring approximately 3.3 cm in diameter, incompletely imaged. Further evaluation with nonemergent CTA of the abdomen and pelvis is recommended.   Electronically Signed   By: Sandi Mariscal M.D.   On: 03/03/2015 12:13    Objective  Filed Vitals:   03/03/15 1503 03/03/15 1754 03/03/15 2040 03/04/15 0349  BP: 116/94  146/57 155/59  Pulse: 95  79 97  Temp: 97.3 F (36.3 C)  98 F (36.7 C) 97.9 F (36.6 C)  TempSrc: Oral  Oral Oral  Resp: '18  18 18  '$ Height:  '5\' 7"'$  (1.702 m)    Weight:  62.234 kg (137 lb 3.2 oz)  62.3 kg (137 lb 5.6 oz)  SpO2: 96%  96% 95%    Intake/Output Summary (Last 24 hours) at 03/04/15 1332 Last data filed at 03/04/15 1214  Gross per 24 hour  Intake   1080 ml  Output   1650 ml  Net   -570 ml   Filed Weights   03/03/15 1754 03/04/15 0349  Weight: 62.234  kg (137 lb 3.2 oz) 62.3 kg (137 lb 5.6 oz)    Exam:  General:  NAD  HEENT: no scleral icterus, PERRL  Cardiovascular: RRR without MRG, decreased peripheral pulses, no edema  Respiratory: CTA biL, good air movement, no wheezing, no crackles, no rales  Abdomen: soft, non tender, BS +, no guarding  MSK/Extremities: no clubbing/cyanosis, no joint swelling  Skin: no rashes  Neuro: non focal, strength 5/5 in all 4   Data Reviewed: Basic Metabolic  Panel:  Recent Labs Lab 03/03/15 0952 03/04/15 0452  NA 126* 127*  K 5.3* 4.7  CL 92* 96*  CO2 26 21*  GLUCOSE 193* 191*  BUN 11 10  CREATININE 0.88 0.82  CALCIUM 9.3 8.6*  MG 1.8  --    Liver Function Tests:  Recent Labs Lab 03/03/15 0952  AST 21  ALT 19  ALKPHOS 80  BILITOT 0.4  PROT 6.7  ALBUMIN 3.5   CBC:  Recent Labs Lab 03/03/15 0952 03/04/15 0452  WBC 7.6 7.6  HGB 13.2 12.1*  HCT 37.6* 34.6*  MCV 88.7 88.5  PLT 250 248   Cardiac Enzymes:  Recent Labs Lab 03/03/15 0952 03/03/15 1956 03/03/15 2225 03/04/15 0452  TROPONINI <0.03 <0.03 <0.03 <0.03   CBG:  Recent Labs Lab 03/03/15 1236 03/03/15 1656 03/03/15 2023 03/04/15 0616 03/04/15 1154  GLUCAP 103* 279* 279* 159* 177*    Scheduled Meds: . aspirin  81 mg Oral Daily  . atorvastatin  40 mg Oral q1800  . docusate sodium  100 mg Oral BID  . heparin  5,000 Units Subcutaneous 3 times per day  . insulin aspart  0-9 Units Subcutaneous TID WC  . insulin aspart  3 Units Subcutaneous TID WC  . insulin glargine  10 Units Subcutaneous QHS  . lisinopril  20 mg Oral Daily  . metoprolol  25 mg Oral Daily  . nicotine  21 mg Transdermal Daily  . nystatin cream   Topical BID  . omega-3 acid ethyl esters  1,000 mg Oral BID  . pantoprazole  40 mg Oral Daily  . senna  1 tablet Oral BID  . sodium chloride  3 mL Intravenous Q12H   Continuous Infusions:   Time spent: 25 minutes  Marzetta Board, MD [facility] Pager [phone]. If 7 PM - 7 AM, please contact night-coverage at [email or web address], password [facility] 03/04/2015, 1:32 PM  LOS: 1 day

## 2015-03-04 NOTE — Progress Notes (Signed)
nuc study neg for ischemia, pt has own cardiologist at the [facility] he will follow up with, but he is welcome to call our group if he would like to follow with [place].

## 2015-03-04 NOTE — Progress Notes (Signed)
Patient discharged to home with instructions. 

## 2015-03-04 NOTE — Consult Note (Signed)
CARDIOLOGY CONSULT NOTE   Patient ID: Mark Hurst MRN: [id number] DOB/AGE: 01-Dec-1950 64 y.o.  Admit Date: 03/03/2015  Primary Physician: Mark Gong, MD Primary Cardiologist     [place], [place]   ([facility] in [place] in the past)   Clinical Summary Mark Hurst is a 64 y.o.male. He was admitted with chest discomfort. Troponins are normal. He has pulmonary nodules that are concerning that will need more aggressive workup. Cardiology is consulting to help decide if he can be cleared for all pulmonary evaluation.  The patient has known coronary disease. He had PTCA, rotational atherectomy elsewhere in the past. This was done in 1997. Recently his primary physician in [place] arrange for him to have carotid Dopplers, 2-D echo, and nuclear stress test. He's had the carotid Dopplers. We do not know the result. We know that he has been referred to vascular surgery for overall assessment of his carotids and his claudication. Echo in nuclear studies have not yet been done. The patient has been having some chest discomfort at home. Many aspects of the symptom are atypical. However it is concerning with his history of coronary disease. He had significant discomfort on the day of admission. Troponins are normal so far. He had a nuclear study in 2010 revealing no significant ischemia.  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 He has had some mild recent palpitations, but there is been no documentation of any arrhythmias.   Allergies  Allergen Reactions  . Codeine Itching    Medications Scheduled Medications: . aspirin  81 mg Oral Daily  . atorvastatin  40 mg Oral q1800  . docusate sodium  100 mg Oral BID  . heparin  5,000 Units Subcutaneous 3 times per day  . insulin aspart  0-9 Units Subcutaneous TID WC  . insulin aspart  3 Units Subcutaneous TID WC  . insulin glargine  10 Units Subcutaneous QHS  . lisinopril  20 mg Oral Daily  . metoprolol  25 mg Oral Daily   . nicotine  21 mg Transdermal Daily  . nystatin cream   Topical BID  . omega-3 acid ethyl esters  1,000 mg Oral BID  . pantoprazole  40 mg Oral Daily  . senna  1 tablet Oral BID  . sodium chloride  3 mL Intravenous Q12H     Infusions: . sodium chloride 100 mL/hr at 03/03/15 1700     PRN Medications:  acetaminophen **OR** acetaminophen, alum & mag hydroxide-simeth, bisacodyl, gi cocktail, LORazepam, morphine injection, nitroGLYCERIN, ondansetron **OR** ondansetron (ZOFRAN) IV, oxyCODONE, polyethylene glycol   Past Medical History  Diagnosis Date  . WPW (Wolff-Parkinson-White syndrome)     ablated  . DM (dermatomyositis)     x 10 years  . Hyperlipidemia     x 13 years  . Tobacco abuse   . CAD (coronary artery disease)     1997 LAD 95% stenosis. He had Rotablator of small  couple lesions in this astery. His last stress perfusion study was in 2001 with no evidence of ischemia.  . Iliac artery occlusion, right   . Hypertension     x 13 years  . Anginal pain   . Type II diabetes mellitus   . Hepatitis A 1972    "in [employer]"    Past Surgical History  Procedure Laterality Date  . Small intestine surgery  ~ 1976    following ingestion of a fish bone  . Fracture surgery    . Colon surgery    .  Hip fracture surgery Right 1976?    "have steel plate put in"  . Percutaneous coronary rotoblator intervention (pci-r)  ~ 1998  . Coronary angioplasty  ~ 1998  . Cataract extraction w/ intraocular lens  implant, bilateral Bilateral ~ 2002  . Radiofrequency ablation      for WPW    Family History  Problem Relation Age of Onset  . Diabetes Mother   . Depression Mother   . Coronary artery disease Father 44  . Lung cancer Father   . Cancer Father     Social History Mark Hurst reports that he has been smoking Cigarettes.  He has a 94 pack-year smoking history. He has never used smokeless tobacco. Mark Hurst reports that he drinks about 2.4 oz of alcohol per week.  Review of  Systems Patient denies fever, chills, headache, sweats, rash, change in vision, change in hearing, cough, nausea or vomiting, urinary symptoms. All other systems are reviewed and are negative.  Physical Examination Blood pressure 155/59, pulse 97, temperature 97.9 F (36.6 C), temperature source Oral, resp. rate 18, height '5\' 7"'$  (1.702 m), weight 137 lb 5.6 oz (62.3 kg), SpO2 95 %.  Intake/Output Summary (Last 24 hours) at 03/04/15 1007 Last data filed at 03/04/15 0900  Gross per 24 hour  Intake   1080 ml  Output   2000 ml  Net   -920 ml   Patient is oriented to person time and place. Affect is normal. Head is atraumatic. Sclera and conjunctiva are normal. He has poor dentition. There is no jugulovenous distention. Lungs are clear. Respiratory effort is not labored. Cardiac exam is S1 and S2. The abdomen is soft. There is no peripheral edema. Patient has bilateral carotid bruits. He has evidence of an old injury to his left lower leg. His chronic skin changes on the lower extremities. Neurologic is grossly intact.  Prior Cardiac Testing/Procedures  Lab Results  Basic Metabolic Panel:  Recent Labs Lab 03/03/15 0952 03/04/15 0452  NA 126* 127*  K 5.3* 4.7  CL 92* 96*  CO2 26 21*  GLUCOSE 193* 191*  BUN 11 10  CREATININE 0.88 0.82  CALCIUM 9.3 8.6*  MG 1.8  --     Liver Function Tests:  Recent Labs Lab 03/03/15 0952  AST 21  ALT 19  ALKPHOS 80  BILITOT 0.4  PROT 6.7  ALBUMIN 3.5    CBC:  Recent Labs Lab 03/03/15 0952 03/04/15 0452  WBC 7.6 7.6  HGB 13.2 12.1*  HCT 37.6* 34.6*  MCV 88.7 88.5  PLT 250 248    Cardiac Enzymes:  Recent Labs Lab 03/03/15 0952 03/03/15 1956 03/03/15 2225 03/04/15 0452  TROPONINI <0.03 <0.03 <0.03 <0.03    BNP: Invalid input(s): POCBNP   Radiology: Dg Chest 2 View  03/03/2015   CLINICAL DATA:  Acute chest pain.  EXAM: CHEST  2 VIEW  COMPARISON:  None.  FINDINGS: The heart size and mediastinal contours are within  normal limits. No pneumothorax or pleural effusion is noted. Mild interstitial densities are noted throughout both lungs concerning for possible pulmonary edema or scarring. Possible nodular density is seen in right upper lobe. The visualized skeletal structures are unremarkable.  IMPRESSION: Mild interstitial densities are noted throughout both lungs which may represent pulmonary edema or possibly scarring. Small nodular density is noted in right upper lobe ; CT scan of the chest is recommended to evaluate for possible pulmonary nodule.   Electronically Signed   By: Marijo Conception, M.D.  On: 03/03/2015 09:34   Ct Chest W Contrast  03/03/2015   CLINICAL DATA:  Mid chest pain earlier today, now resolved. Abnormal chest radiograph with question of small right upper lobe pulmonary nodule. History of smoking.  EXAM: CT CHEST WITH CONTRAST  TECHNIQUE: Multidetector CT imaging of the chest was performed during intravenous contrast administration.  CONTRAST:  62m OMNIPAQUE IOHEXOL 300 MG/ML  SOLN  COMPARISON:  Chest radiograph-earlier same day  FINDINGS: There is a spiculated approximately 2.6 x 2.2 x 2.6 cm nodule within the medial aspect of the right lung apex (representative axial image 6, series 203, coronal image 69, series 204) which is worrisome for a primary bronchogenic carcinoma.  There are multiple additional scattered bilateral punctate pulmonary nodules, the largest of which within the subpleural aspect the right lower lobe measures 1.6 x 1.2 cm (is 43, series 203) with additional dominant nodule within the right lower lobe measuring 0.7 cm in diameter (is 41), dominant nodule within the subpleural aspect of the right upper lobe measuring 0.6 cm in diameter and dominant nodule within the left lower lobe measuring approximately 0.8 cm (image 37, series 203).  Borderline enlarged right suprahilar lymph node measures approximately 1.1 cm in greatest short axis diameter (image 29, series 201) and borderline  enlarged high right pretracheal lymph node measures approximately 1.1 cm in diameter (image 15, series 201). Additional scattered shotty mediastinal lymph nodes are individually not enlarged by size criteria with index pretracheal lymph node measuring 0.6 cm (image 18) an index precarinal lymph node measuring 0.9 cm). No axillary lymphadenopathy.  Advanced predominantly centrilobular emphysematous change. Minimal dependent subpleural ground-glass opacities, right greater than left, likely atelectasis or scar. No discrete focal airspace opacities. No air bronchograms. No pleural effusion or pneumothorax. The central pulmonary airways appear widely patent.  Normal heart size.  Coronary artery calcifications.  Although this examination was not tailored for the evaluation of the pulmonary arteries, there are no discrete filling defects within the central pulmonary arterial tree to suggest central pulmonary embolism.  Moderate to large amount of mixed calcified and noncalcified atherosclerotic plaque within a mildly tortuous but normal caliber thoracic aorta. No definite thoracic aortic dissection on this nongated examination. Conventional configuration of the aortic arch. There is a suspected hemodynamically significant narrowing involving the origin of the left common carotid artery (image 18, series 201).  Limited early arterial phase evaluation of the upper abdomen demonstrates a moderate to large amount of eccentric mixed calcified and noncalcified atherosclerotic plaque within in aneurysmal infrarenal abdominal aorta measuring at least 3.3 cm in maximal diameter (image 72, series 201).  No acute or aggressive osseous abnormalities.  Regional soft tissues appear normal. Normal appearance of the thyroid gland.  IMPRESSION: 1. Advanced centrilobular emphysematous change with an approximately 2.6 cm spiculated nodule within the medial aspect of the right lung apex worrisome for bronchogenic carcinoma. Borderline  enlarged right suprahilar and high pretracheal lymph nodes, nonspecific though worrisome for metastatic disease. Further evaluation with PET imaging is recommended. 2. Additional scattered bilateral indeterminate punctate pulmonary nodules, the largest of which within the right lower lobe measures 1.6 cm in diameter, nonspecific though several of which may be further evaluated at the time of PET imaging. 3. Coronary artery calcifications. 4. Moderate to large amount of mixed calcified and noncalcified atherosclerotic plaque within a normal caliber thoracic aorta. Suspected hemodynamically significant narrowing involving the origin of the left common carotid artery. 5. Moderate to large amount of mixed calcified and noncalcified atherosclerotic plaque within a  mildly aneurysmal abdominal aorta measuring approximately 3.3 cm in diameter, incompletely imaged. Further evaluation with nonemergent CTA of the abdomen and pelvis is recommended.   Electronically Signed   By: Sandi Mariscal M.D.   On: 03/03/2015 12:13     ECG: I have reviewed current and old EKGs. In the past his EKG was normal. Currently there is decreased R-wave in V2. I cannot be sure if this is lead positioning or not.  Telemetry:   I have reviewed telemetry today March 04, 2015. There is normal sinus rhythm.   Impression and Recommendations    Chest pain    CAD       The patient had a coronary intervention in 1997. In 2010 had a nuclear study with no ischemia. Most recently in [place] his primary physician was arranging for him to have an echo and a nuclear study. These were to be scheduled in [place]. The patient has significant transportation problems. He presented with chest discomfort. There is no diagnostic EKG changes and troponins are normal. There is decreased R-wave in V2 that is new. The patient has significant pulmonary nodules that will need further evaluation. It will be most appropriate to proceed with in-hospital evaluation  of his chest discomfort at this time. I have for an echo and a nuclear study. I'm calling echo-sensitive to study this morning. I'm hopeful that we can proceed with a nuclear study today even though the patient as he did breakfast. Hopefully can be done later.    Diabetes   TOBACCO ABUSE   Essential hypertension    WOLFF (WOLFE)-PARKINSON-WHITE (WPW) SYNDROME     The patient had WPW ablation in the past. We do not have any evidence of recurrent significant arrhythmias. Continue to monitor him in the hospital.      Solitary pulmonary nodule     Patient has significant nodule(S). That will need aggressive evaluation. We need to clear his cardiac status first since he presented with chest pain.    PAD (peripheral artery disease)    Patient has significant peripheral arterial disease. He has bilateral carotid bruits. He tells me that he had a Doppler study done at the [facility] recently. I would suggest trying to obtain this data. He did tell me that he was being referred to vascular surgery for overall assessment. He did not say that he needed carotid surgery, but he really does not know.   Daryel November, MD 03/04/2015, 10:07 AM

## 2015-03-04 NOTE — Progress Notes (Signed)
lexiscan myoview completed, nuc results pending

## 2015-03-04 NOTE — Progress Notes (Signed)
Name: Mark Hurst MRN: [id number] DOB: 1951-06-06    ADMISSION DATE:  03/03/2015 CONSULTATION DATE:  03/03/2015  REFERRING MD :  Dyann Kief  CHIEF COMPLAINT:  Lung lesions  BRIEF PATIENT DESCRIPTION: 64 year old male who presented to [facility] 6/2 complaining of chest pain without relief from nitroglycerine. CT chest was ordered and showed several pulmonary nodules. PCCM consulted for tissue sampling.   SIGNIFICANT EVENTS    STUDIES:  CT chest 6/2 > Advanced centrilobular emphysematous change with an approximately 2.6 cm spiculated nodule within the medial aspect of the right lung apex worrisome for bronchogenic carcinoma. Borderline enlarged right suprahilar and high pretracheal lymph nodes.Additional scattered bilateral indeterminate punctate pulmonary nodules, the largest of which within the right lower lobe measures 1.6 cm in diameter.  Moderate to large amount of mixed calcified and noncalcified atherosclerotic plaque within a normal caliber thoracic aorta. Suspected hemodynamically significant narrowing involving the origin of the left common carotid artery. Moderate to large amount of mixed calcified and noncalcified atherosclerotic plaque within a mildly aneurysmal abdominal aorta measuring approximately 3.3 cm in diameter, incompletely imaged.   HISTORY OF PRESENT ILLNESS:  64 year old male, smoker, with PMH of WPW syndrome with ablation in 1997, dermatomyositis, hyperlipidemia, CAD, Iliac areter occlusion, HTN, and DM. He has had intermittent chest pain for the past 1-2 weeks. Over that time period, CP has been resolved with sublingual nitroglycerine. Pain was described as pressure with palpitations which occurr routinely during morning routine and straining with BM. 6/2 pain happened again during same routine, however, it was not resolved with nitroglycerine. He presented to ED with these complaints. CT scan was obtained in ED which showed several lesions, pulmonary nodules. Admitted to [facility], PCCM  to consult for further eval. 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 It was not uncommon to find insects, rodents, and bird/chicken droppings in these areas. He also used pesticides while down there and did not always wear mask.    SUBJECTIVE:   VITAL SIGNS: Temp:  [97.3 F (36.3 C)-98 F (36.7 C)] 97.9 F (36.6 C) (06/03 0349) Pulse Rate:  [53-97] 97 (06/03 0349) Resp:  [13-26] 18 (06/03 0349) BP: (116-173)/(48-100) 155/59 mmHg (06/03 0349) SpO2:  [92 %-98 %] 95 % (06/03 0349) Weight:  [137 lb 3.2 oz (62.234 kg)-137 lb 5.6 oz (62.3 kg)] 137 lb 5.6 oz (62.3 kg) (06/03 0349)  PHYSICAL EXAMINATION: General:  Male of normal body habitus in NAD. When are you going to do bronch? Neuro:  Alert,oriented, non-focal HEENT:  [place]/AT, no JVD noted, PERRL Cardiovascular:  RRR, no MRG Lungs:  Clear bilateral breath sounds, no SOB Abdomen:  Soft, non-tender, non-distended Musculoskeletal:  No acute deformity or ROM limitation Skin:  Grossly intact   Recent Labs Lab 03/03/15 0952 03/04/15 0452  NA 126* 127*  K 5.3* 4.7  CL 92* 96*  CO2 26 21*  BUN 11 10  CREATININE 0.88 0.82  GLUCOSE 193* 191*    Recent Labs Lab 03/03/15 0952 03/04/15 0452  HGB 13.2 12.1*  HCT 37.6* 34.6*  WBC 7.6 7.6  PLT 250 248   Dg Chest 2 View  03/03/2015   CLINICAL DATA:  Acute chest pain.  EXAM: CHEST  2 VIEW  COMPARISON:  None.  FINDINGS: The heart size and mediastinal contours are within normal limits. No pneumothorax or pleural effusion is noted.  Mild interstitial densities are noted throughout both lungs concerning for possible pulmonary edema or scarring. Possible nodular density is seen in right upper lobe. The visualized skeletal structures are unremarkable.  IMPRESSION: Mild interstitial densities are noted throughout both lungs which may represent pulmonary edema or  possibly scarring. Small nodular density is noted in right upper lobe ; CT scan of the chest is recommended to evaluate for possible pulmonary nodule.   Electronically Signed   By: Marijo Conception, M.D.   On: 03/03/2015 09:34   Ct Chest W Contrast  03/03/2015   CLINICAL DATA:  Mid chest pain earlier today, now resolved. Abnormal chest radiograph with question of small right upper lobe pulmonary nodule. History of smoking.  EXAM: CT CHEST WITH CONTRAST  TECHNIQUE: Multidetector CT imaging of the chest was performed during intravenous contrast administration.  CONTRAST:  80m OMNIPAQUE IOHEXOL 300 MG/ML  SOLN  COMPARISON:  Chest radiograph-earlier same day  FINDINGS: There is a spiculated approximately 2.6 x 2.2 x 2.6 cm nodule within the medial aspect of the right lung apex (representative axial image 6, series 203, coronal image 69, series 204) which is worrisome for a primary bronchogenic carcinoma.  There are multiple additional scattered bilateral punctate pulmonary nodules, the largest of which within the subpleural aspect the right lower lobe measures 1.6 x 1.2 cm (is 43, series 203) with additional dominant nodule within the right lower lobe measuring 0.7 cm in diameter (is 41), dominant nodule within the subpleural aspect of the right upper lobe measuring 0.6 cm in diameter and dominant nodule within the left lower lobe measuring approximately 0.8 cm (image 37, series 203).  Borderline enlarged right suprahilar lymph node measures approximately 1.1 cm in greatest short axis diameter (image 29, series 201) and borderline enlarged high right pretracheal lymph node measures approximately 1.1 cm in diameter (image 15, series 201). Additional scattered shotty mediastinal lymph nodes are individually not enlarged by size criteria with index pretracheal lymph node measuring 0.6 cm (image 18) an index precarinal lymph node measuring 0.9 cm). No axillary lymphadenopathy.  Advanced predominantly centrilobular  emphysematous change. Minimal dependent subpleural ground-glass opacities, right greater than left, likely atelectasis or scar. No discrete focal airspace opacities. No air bronchograms. No pleural effusion or pneumothorax. The central pulmonary airways appear widely patent.  Normal heart size.  Coronary artery calcifications.  Although this examination was not tailored for the evaluation of the pulmonary arteries, there are no discrete filling defects within the central pulmonary arterial tree to suggest central pulmonary embolism.  Moderate to large amount of mixed calcified and noncalcified atherosclerotic plaque within a mildly tortuous but normal caliber thoracic aorta. No definite thoracic aortic dissection on this nongated examination. Conventional configuration of the aortic arch. There is a suspected hemodynamically significant narrowing involving the origin of the left common carotid artery (image 18, series 201).  Limited early arterial phase evaluation of the upper abdomen demonstrates a moderate to large amount of eccentric mixed calcified and noncalcified atherosclerotic plaque within in aneurysmal infrarenal abdominal aorta measuring at least 3.3 cm in maximal diameter (image 72, series 201).  No acute or aggressive osseous abnormalities.  Regional soft tissues appear normal. Normal appearance of the thyroid gland.  IMPRESSION: 1. Advanced centrilobular emphysematous change with an approximately 2.6 cm spiculated nodule within the medial aspect of the right lung apex worrisome for bronchogenic carcinoma. Borderline enlarged right suprahilar and high pretracheal lymph nodes, nonspecific though worrisome for metastatic disease. Further evaluation with PET imaging is recommended. 2.  Additional scattered bilateral indeterminate punctate pulmonary nodules, the largest of which within the right lower lobe measures 1.6 cm in diameter, nonspecific though several of which may be further evaluated at the time  of PET imaging. 3. Coronary artery calcifications. 4. Moderate to large amount of mixed calcified and noncalcified atherosclerotic plaque within a normal caliber thoracic aorta. Suspected hemodynamically significant narrowing involving the origin of the left common carotid artery. 5. Moderate to large amount of mixed calcified and noncalcified atherosclerotic plaque within a mildly aneurysmal abdominal aorta measuring approximately 3.3 cm in diameter, incompletely imaged. Further evaluation with nonemergent CTA of the abdomen and pelvis is recommended.   Electronically Signed   By: Sandi Mariscal M.D.   On: 03/03/2015 12:13    ASSESSMENT / PLAN:  RUL mass Multiple other scattered pulmonary nodules Tobacco abuse disorder ? New diagnosis COPD without evidence for acute exacerbation - CT chest reviewed by Dr Lamonte Sakai today > the R apical / medial mass is potentially reachable via navigational bronchoscopy. I have asked radiology to create a high res CT disc to use for the procedure. This would be done as an outpt. As long as his cardiac risk is addressed, I will be happy to arrange if IR doesn't believe he is a good target for needle bx.  - Will need PFT as outpatient -Stop smoking. -Await cards input > going for stress testing today 6/3   [facility] Madigan Rosensteel ACNP Maryanna Shape PCCM Pager [phone] till 3 pm If no answer page [phone] 03/04/2015, 9:38 AM    Attending Note:  I have examined patient, reviewed labs, studies and notes. I have discussed the case with S Shelbee Apgar, and I agree with the data and plans as amended above. As above, I have reviewed the CT scan, believe the RUL mass could be approached bronchoscopically in OV with navigation. He is gone now for cardiac study. Will consider setting up ENB and biopsies after the cardiac stress testing. I have called my coordinator at the [facility] to arrange  Baltazar Apo, MD, PhD 03/04/2015, 2:30 PM [facility] [phone] or if no answer  [phone]

## 2015-03-04 NOTE — Evaluation (Signed)
Physical Therapy Evaluation Patient Details Name: Mark Hurst MRN: [id number] DOB: 21-Mar-1951 Today's Date: 03/04/2015   History of Present Illness  pt presents with Pulmonary Nodules.    Clinical Impression  Pt mobilizing at baseline level of function.  Pt with hx of leg length discrepancy, but demonstrates good safety with ambulation.  Pt interested in cane and discussed this could be something pt uses for outdoor ambulation if desired.  No further PT needs at this time, will sign off.      Follow Up Recommendations No PT follow up;Supervision - Intermittent    Equipment Recommendations  None recommended by PT    Recommendations for Other Services       Precautions / Restrictions Precautions Precautions: Fall Restrictions Weight Bearing Restrictions: No      Mobility  Bed Mobility Overal bed mobility: Independent                Transfers Overall transfer level: Modified independent Equipment used: None             General transfer comment: Use of UEs, but no difficulties.    Ambulation/Gait Ambulation/Gait assistance: Modified independent (Device/Increase time) Ambulation Distance (Feet): 300 Feet Assistive device: None Gait Pattern/deviations: Step-through pattern;Decreased stride length     General Gait Details: pt has hx of leg length discrepency, but demonstrates good safety with mobility.  pt able to perform head turns and negotiate around obstacles without deficit.    Stairs            Wheelchair Mobility    Modified Rankin (Stroke Patients Only)       Balance Overall balance assessment: No apparent balance deficits (not formally assessed)                                           Pertinent Vitals/Pain Pain Assessment: No/denies pain    Home Living Family/patient expects to be discharged to:: Private residence Living Arrangements: Spouse/significant other Available Help at Discharge: Family;Available  PRN/intermittently Type of Home: House Home Access: Stairs to enter Entrance Stairs-Rails: Right Entrance Stairs-Number of Steps: 2 Home Layout: One level Home Equipment: None      Prior Function Level of Independence: Independent               Hand Dominance   Dominant Hand: Right    Extremity/Trunk Assessment   Upper Extremity Assessment: Defer to OT evaluation           Lower Extremity Assessment: Generalized weakness (Chronic Leg length discrepency)         Communication   Communication: No difficulties  Cognition Arousal/Alertness: Awake/alert Behavior During Therapy: WFL for tasks assessed/performed Overall Cognitive Status: Within Functional Limits for tasks assessed                      General Comments      Exercises        Assessment/Plan    PT Assessment Patent does not need any further PT services  PT Diagnosis Difficulty walking   PT Problem List    PT Treatment Interventions     PT Goals (Current goals can be found in the Care Plan section) Acute Rehab PT Goals Patient Stated Goal: Walk more PT Goal Formulation: All assessment and education complete, DC therapy    Frequency     Barriers to discharge  Co-evaluation               End of Session Equipment Utilized During Treatment: Gait belt Activity Tolerance: Patient tolerated treatment well Patient left: in bed;with call bell/phone within reach;with nursing/sitter in room (Sitting EOB.) Nurse Communication: Mobility status         Time: 5809-9833 PT Time Calculation (min) (ACUTE ONLY): 22 min   Charges:   PT Evaluation $Initial PT Evaluation Tier I: 1 Procedure     PT G CodesCatarina Hartshorn, [place] 03/04/2015, 12:04 PM

## 2015-03-04 NOTE — Progress Notes (Signed)
  Echocardiogram 2D Echocardiogram has been performed.  Mark Hurst FRANCES 03/04/2015, 11:50 AM

## 2015-03-05 NOTE — Discharge Summary (Signed)
Physician Discharge Summary  Mark Hurst IOM:[id number] DOB: 1951/06/10 DOA: 03/03/2015  PCP: [name], MD  Admit date: 03/03/2015 Discharge date: 03/05/2015  Time spent: > 30 minutes  Recommendations for Outpatient Follow-up:  1. Follow up with Dr. Lamonte Sakai next week. His office will set up bronchoscopy 2. Follow up with cardiology at [facility] 3. Repeat BMP in 1 week  Discharge Diagnoses:  Principal Problem:   Chest pain Active Problems:   Diabetes   TOBACCO ABUSE   Essential hypertension   WOLFF (WOLFE)-PARKINSON-WHITE (WPW) SYNDROME   Hyponatremia   CAD (coronary artery disease)   Lung mass   Solitary pulmonary nodule   Hypoxemia   Tobacco abuse   Other emphysema   PAD (peripheral artery disease)  Discharge Condition: stable  Diet recommendation: heart healthy  Filed Weights   03/03/15 1754 03/04/15 0349  Weight: 62.234 kg (137 lb 3.2 oz) 62.3 kg (137 lb 5.6 oz)   History of present illness:  Mark Hurst is a 64 y.o. male with a history of HTN, DM2, HLD, CAD, tobacco abuse, WPW with ablation in 1997, and a previous AMI presents today with chest pain. He has had waxing and waning chest pain the last 1.5 weeks. Each episode previous to this morning responded to sublingual nitroglycerin. The current episode of chest pain began at 5am and was described as squeezing and pressure with palpitations. These symptoms occur every morning after he drinks his coffee, two cokes, cereal, and attempts defecation while constipated. The pressure and pain was greater on his left side. He took 8 sublingual nitroglycerin tablets this am without relief. The pain subsided once in the ambulance. Patients states he has bad circulation, blocked iliac artery, and occasional productive cough with brown sputum. He denies dizziness, DOE, dysphagia, fevers, abdominal pain, dysuria, and PND. In the ER CT chest showed multiple masses up to 2 cm in size. Mild hyponatremia (126), Mild hyperkalemia (5.3), and  an elevated glucose. TRH will admit to a telemetry bed for chest pain rule out and pulmonary consultation for lung masses.  Hospital Course:  Patient was admitted to the hospital with chest pain, palpitations. On admission he underwent a CT scan which showed a 2.6 cm spiculated nodule within the medial aspect of the right lung as well as borderline enlargement of the right suprahilar and high pretracheal lymph nodes. This is suspicious for bronchogenic carcinoma with metastasis. Pulmonology was consulted to evaluate patient for bronchoscopy and cardiology was consulted to evaluate given his chest pain. Patient underwent a stress test on 6/3 which showed to the myocardial perfusion was normal, and he was evaluated as a low risk study. His left ventricular ejection fraction was found to be normal. Patient is very insistent to be discharged home and have his bronchoscopy and biopsy done as an outpatient. I've discussed this with the patient as well as Dr. Lamonte Sakai from pulmonology, and his bronchoscopy would be arranged as an outpatient as soon as possible. He will likely also need a PET scan and a potential oncology referral. Patient expressed full understanding about the plan and was discharged home in stable condition. He will follow-up with the [facility] cardiologist in the future. He was noticed to have mild hyponatremia, unknown baseline, improved with IV fluids, recommend repeat BMP in a week.  Procedures:  Stress test   Consultations:  Cardiology   Pulmonology   Discharge Exam: Filed Vitals:   03/04/15 1546 03/04/15 1548 03/04/15 1550 03/04/15 1552  BP: 158/75 169/82 168/78 174/81  Pulse:  91 100 99 96  Temp:      TempSrc:      Resp: 16 16    Height:      Weight:      SpO2:        General: NAD Cardiovascular: RRR Respiratory: CTA biL  Discharge Instructions     Medication List    TAKE these medications        aspirin 81 MG tablet  Take 81 mg by mouth daily.     atorvastatin 80  MG tablet  Commonly known as:  LIPITOR  Take 40 mg by mouth daily at 6 PM.     bisacodyl 5 MG EC tablet  Commonly known as:  DULCOLAX  Take 5 mg by mouth daily as needed for mild constipation or moderate constipation.     Cholecalciferol 2000 UNITS Tabs  Take 2,000 Units by mouth daily.     Fish Oil Oil  Take 1,000 mg by mouth 2 (two) times daily.     lisinopril 40 MG tablet  Commonly known as:  PRINIVIL,ZESTRIL  take 1 tablet by mouth once daily     loratadine 10 MG tablet  Commonly known as:  CLARITIN  Take 10 mg by mouth daily.     metFORMIN 500 MG tablet  Commonly known as:  GLUCOPHAGE  Take 1,000 mg by mouth 2 (two) times daily with a meal.     metoprolol 50 MG tablet  Commonly known as:  LOPRESSOR  Take 25 mg by mouth daily.     multivitamin tablet  Take 1 tablet by mouth daily.           Follow-up Information    Follow up with Guhu,Bhuvana, MD. Schedule an appointment as soon as possible for a visit in 1 week.   Specialty:  Internal Medicine   Contact information:   [address] [place] [address] [phone]       Follow up with Collene Gobble., MD. Schedule an appointment as soon as possible for a visit in 1 week.   Specialty:  Pulmonary Disease   Contact information:   [address] [place] [address] [phone]       The results of significant diagnostics from this hospitalization (including imaging, microbiology, ancillary and laboratory) are listed below for reference.    Significant Diagnostic Studies: Dg Chest 2 View  03/03/2015   CLINICAL DATA:  Acute chest pain.  EXAM: CHEST  2 VIEW  COMPARISON:  None.  FINDINGS: The heart size and mediastinal contours are within normal limits. No pneumothorax or pleural effusion is noted. Mild interstitial densities are noted throughout both lungs concerning for possible pulmonary edema or scarring. Possible nodular density is seen in right upper lobe. The visualized skeletal structures  are unremarkable.  IMPRESSION: Mild interstitial densities are noted throughout both lungs which may represent pulmonary edema or possibly scarring. Small nodular density is noted in right upper lobe ; CT scan of the chest is recommended to evaluate for possible pulmonary nodule.   Electronically Signed   By: Marijo Conception, M.D.   On: 03/03/2015 09:34   Ct Chest W Contrast  03/03/2015   CLINICAL DATA:  Mid chest pain earlier today, now resolved. Abnormal chest radiograph with question of small right upper lobe pulmonary nodule. History of smoking.  EXAM: CT CHEST WITH CONTRAST  TECHNIQUE: Multidetector CT imaging of the chest was performed during intravenous contrast administration.  CONTRAST:  102m OMNIPAQUE IOHEXOL 300 MG/ML  SOLN  COMPARISON:  Chest radiograph-earlier same day  FINDINGS: There is a spiculated approximately 2.6 x 2.2 x 2.6 cm nodule within the medial aspect of the right lung apex (representative axial image 6, series 203, coronal image 69, series 204) which is worrisome for a primary bronchogenic carcinoma.  There are multiple additional scattered bilateral punctate pulmonary nodules, the largest of which within the subpleural aspect the right lower lobe measures 1.6 x 1.2 cm (is 43, series 203) with additional dominant nodule within the right lower lobe measuring 0.7 cm in diameter (is 41), dominant nodule within the subpleural aspect of the right upper lobe measuring 0.6 cm in diameter and dominant nodule within the left lower lobe measuring approximately 0.8 cm (image 37, series 203).  Borderline enlarged right suprahilar lymph node measures approximately 1.1 cm in greatest short axis diameter (image 29, series 201) and borderline enlarged high right pretracheal lymph node measures approximately 1.1 cm in diameter (image 15, series 201). Additional scattered shotty mediastinal lymph nodes are individually not enlarged by size criteria with index pretracheal lymph node measuring 0.6 cm  (image 18) an index precarinal lymph node measuring 0.9 cm). No axillary lymphadenopathy.  Advanced predominantly centrilobular emphysematous change. Minimal dependent subpleural ground-glass opacities, right greater than left, likely atelectasis or scar. No discrete focal airspace opacities. No air bronchograms. No pleural effusion or pneumothorax. The central pulmonary airways appear widely patent.  Normal heart size.  Coronary artery calcifications.  Although this examination was not tailored for the evaluation of the pulmonary arteries, there are no discrete filling defects within the central pulmonary arterial tree to suggest central pulmonary embolism.  Moderate to large amount of mixed calcified and noncalcified atherosclerotic plaque within a mildly tortuous but normal caliber thoracic aorta. No definite thoracic aortic dissection on this nongated examination. Conventional configuration of the aortic arch. There is a suspected hemodynamically significant narrowing involving the origin of the left common carotid artery (image 18, series 201).  Limited early arterial phase evaluation of the upper abdomen demonstrates a moderate to large amount of eccentric mixed calcified and noncalcified atherosclerotic plaque within in aneurysmal infrarenal abdominal aorta measuring at least 3.3 cm in maximal diameter (image 72, series 201).  No acute or aggressive osseous abnormalities.  Regional soft tissues appear normal. Normal appearance of the thyroid gland.  IMPRESSION: 1. Advanced centrilobular emphysematous change with an approximately 2.6 cm spiculated nodule within the medial aspect of the right lung apex worrisome for bronchogenic carcinoma. Borderline enlarged right suprahilar and high pretracheal lymph nodes, nonspecific though worrisome for metastatic disease. Further evaluation with PET imaging is recommended. 2. Additional scattered bilateral indeterminate punctate pulmonary nodules, the largest of which  within the right lower lobe measures 1.6 cm in diameter, nonspecific though several of which may be further evaluated at the time of PET imaging. 3. Coronary artery calcifications. 4. Moderate to large amount of mixed calcified and noncalcified atherosclerotic plaque within a normal caliber thoracic aorta. Suspected hemodynamically significant narrowing involving the origin of the left common carotid artery. 5. Moderate to large amount of mixed calcified and noncalcified atherosclerotic plaque within a mildly aneurysmal abdominal aorta measuring approximately 3.3 cm in diameter, incompletely imaged. Further evaluation with nonemergent CTA of the abdomen and pelvis is recommended.   Electronically Signed   By: Sandi Mariscal M.D.   On: 03/03/2015 12:13   Nm Myocar Multi W/spect W/wall Motion / Ef  03/04/2015    Downsloping ST segment depression ST segment depression was noted during  stress  in the inferior and lateral leads (II, III, aVF, V5 and V6),  beginning at 3 minutes of stress, ending at 0 minutes of stress. Stayed  due to LVH  T wave inversion of 3 mm was noted during stress in the inferolateral  leads (II, III, aVF, V5 and V6), beginning at 3 minutes of stress, ending  at 0 minutes of stress. Remained with LVH    03/04/2015    Downsloping ST segment depression ST segment depression was noted during  stress in the inferior and lateral leads (II, III, aVF, V5 and V6),  beginning at 3 minutes of stress, ending at 0 minutes of stress. Stayed  due to LVH  T wave inversion of 3 mm was noted during stress in the inferolateral  leads (II, III, aVF, V5 and V6), beginning at 3 minutes of stress, ending  at 0 minutes of stress. Remained with LVH     Microbiology: No results found for this or any previous visit (from the past 240 hour(s)).   Labs: Basic Metabolic Panel:  Recent Labs Lab 03/03/15 0952 03/04/15 0452  NA 126* 127*  K 5.3* 4.7  CL 92* 96*  CO2 26 21*  GLUCOSE 193* 191*  BUN 11 10    CREATININE 0.88 0.82  CALCIUM 9.3 8.6*  MG 1.8  --    Liver Function Tests:  Recent Labs Lab 03/03/15 0952  AST 21  ALT 19  ALKPHOS 80  BILITOT 0.4  PROT 6.7  ALBUMIN 3.5   CBC:  Recent Labs Lab 03/03/15 0952 03/04/15 0452  WBC 7.6 7.6  HGB 13.2 12.1*  HCT 37.6* 34.6*  MCV 88.7 88.5  PLT 250 248   Cardiac Enzymes:  Recent Labs Lab 03/03/15 0952 03/03/15 1956 03/03/15 2225 03/04/15 0452  TROPONINI <0.03 <0.03 <0.03 <0.03   CBG:  Recent Labs Lab 03/03/15 1656 03/03/15 2023 03/04/15 0616 03/04/15 1154 03/04/15 1708  GLUCAP 279* 279* 159* 177* 192*    Signed:  Marzetta Board  [facility] 03/05/2015, 2:32 PM

## 2015-03-07 ENCOUNTER — Emergency Department (HOSPITAL_COMMUNITY): Payer: Medicare Other

## 2015-03-07 ENCOUNTER — Encounter (HOSPITAL_COMMUNITY): Payer: Self-pay | Admitting: *Deleted

## 2015-03-07 ENCOUNTER — Telehealth: Payer: Self-pay | Admitting: Emergency Medicine

## 2015-03-07 ENCOUNTER — Emergency Department (HOSPITAL_COMMUNITY)
Admission: EM | Admit: 2015-03-07 | Discharge: 2015-03-07 | Disposition: A | Discharge status: Home / Self Care | Payer: Medicare Other | Attending: Emergency Medicine | Admitting: Emergency Medicine

## 2015-03-07 DIAGNOSIS — I2510 Atherosclerotic heart disease of native coronary artery without angina pectoris: Secondary | ICD-10-CM | POA: Diagnosis not present

## 2015-03-07 DIAGNOSIS — I10 Essential (primary) hypertension: Secondary | ICD-10-CM | POA: Diagnosis not present

## 2015-03-07 DIAGNOSIS — R0789 Other chest pain: Secondary | ICD-10-CM

## 2015-03-07 DIAGNOSIS — I456 Pre-excitation syndrome: Secondary | ICD-10-CM | POA: Diagnosis present

## 2015-03-07 DIAGNOSIS — E785 Hyperlipidemia, unspecified: Secondary | ICD-10-CM | POA: Diagnosis not present

## 2015-03-07 DIAGNOSIS — Z7982 Long term (current) use of aspirin: Secondary | ICD-10-CM | POA: Diagnosis not present

## 2015-03-07 DIAGNOSIS — R079 Chest pain, unspecified: Secondary | ICD-10-CM | POA: Diagnosis present

## 2015-03-07 DIAGNOSIS — Z79899 Other long term (current) drug therapy: Secondary | ICD-10-CM | POA: Diagnosis not present

## 2015-03-07 DIAGNOSIS — Z720 Tobacco use: Secondary | ICD-10-CM | POA: Diagnosis present

## 2015-03-07 DIAGNOSIS — E119 Type 2 diabetes mellitus without complications: Secondary | ICD-10-CM | POA: Diagnosis not present

## 2015-03-07 DIAGNOSIS — Z8619 Personal history of other infectious and parasitic diseases: Secondary | ICD-10-CM | POA: Diagnosis not present

## 2015-03-07 DIAGNOSIS — J438 Other emphysema: Secondary | ICD-10-CM | POA: Diagnosis present

## 2015-03-07 DIAGNOSIS — I4891 Unspecified atrial fibrillation: Secondary | ICD-10-CM | POA: Diagnosis not present

## 2015-03-07 DIAGNOSIS — I739 Peripheral vascular disease, unspecified: Secondary | ICD-10-CM | POA: Diagnosis present

## 2015-03-07 DIAGNOSIS — I480 Paroxysmal atrial fibrillation: Secondary | ICD-10-CM | POA: Insufficient documentation

## 2015-03-07 DIAGNOSIS — R000 Tachycardia, unspecified: Secondary | ICD-10-CM | POA: Diagnosis present

## 2015-03-07 DIAGNOSIS — R918 Other nonspecific abnormal finding of lung field: Secondary | ICD-10-CM

## 2015-03-07 LAB — CBG MONITORING, ED: GLUCOSE-CAPILLARY: 118 mg/dL — AB (ref 65–99)

## 2015-03-07 LAB — CBC WITH DIFFERENTIAL/PLATELET
BASOS PCT: 0 % (ref 0–1)
Basophils Absolute: 0 10*3/uL (ref 0.0–0.1)
Eosinophils Absolute: 0.2 10*3/uL (ref 0.0–0.7)
Eosinophils Relative: 2 % (ref 0–5)
HCT: 38.1 % — ABNORMAL LOW (ref 39.0–52.0)
HEMOGLOBIN: 13.3 g/dL (ref 13.0–17.0)
Lymphocytes Relative: 13 % (ref 12–46)
Lymphs Abs: 1.1 10*3/uL (ref 0.7–4.0)
MCH: 31 pg (ref 26.0–34.0)
MCHC: 34.9 g/dL (ref 30.0–36.0)
MCV: 88.8 fL (ref 78.0–100.0)
MONOS PCT: 10 % (ref 3–12)
Monocytes Absolute: 0.9 10*3/uL (ref 0.1–1.0)
NEUTROS PCT: 75 % (ref 43–77)
Neutro Abs: 6.4 10*3/uL (ref 1.7–7.7)
Platelets: 222 10*3/uL (ref 150–400)
RBC: 4.29 MIL/uL (ref 4.22–5.81)
RDW: 12.7 % (ref 11.5–15.5)
WBC: 8.6 10*3/uL (ref 4.0–10.5)

## 2015-03-07 LAB — URINE MICROSCOPIC-ADD ON

## 2015-03-07 LAB — URINALYSIS, ROUTINE W REFLEX MICROSCOPIC
Bilirubin Urine: NEGATIVE
Glucose, UA: NEGATIVE mg/dL
HGB URINE DIPSTICK: NEGATIVE
Ketones, ur: NEGATIVE mg/dL
Leukocytes, UA: NEGATIVE
Nitrite: NEGATIVE
PROTEIN: 30 mg/dL — AB
SPECIFIC GRAVITY, URINE: 1.008 (ref 1.005–1.030)
Urobilinogen, UA: 0.2 mg/dL (ref 0.0–1.0)
pH: 6 (ref 5.0–8.0)

## 2015-03-07 LAB — COMPREHENSIVE METABOLIC PANEL
ALT: 28 U/L (ref 17–63)
AST: 23 U/L (ref 15–41)
Albumin: 3.2 g/dL — ABNORMAL LOW (ref 3.5–5.0)
Alkaline Phosphatase: 76 U/L (ref 38–126)
Anion gap: 10 (ref 5–15)
BILIRUBIN TOTAL: 0.4 mg/dL (ref 0.3–1.2)
BUN: 19 mg/dL (ref 6–20)
CO2: 22 mmol/L (ref 22–32)
Calcium: 9.3 mg/dL (ref 8.9–10.3)
Chloride: 97 mmol/L — ABNORMAL LOW (ref 101–111)
Creatinine, Ser: 0.96 mg/dL (ref 0.61–1.24)
GLUCOSE: 184 mg/dL — AB (ref 65–99)
POTASSIUM: 4.1 mmol/L (ref 3.5–5.1)
SODIUM: 129 mmol/L — AB (ref 135–145)
Total Protein: 6.3 g/dL — ABNORMAL LOW (ref 6.5–8.1)

## 2015-03-07 LAB — RAPID URINE DRUG SCREEN, HOSP PERFORMED
Amphetamines: NOT DETECTED
Barbiturates: NOT DETECTED
Benzodiazepines: NOT DETECTED
Cocaine: NOT DETECTED
Opiates: NOT DETECTED
TETRAHYDROCANNABINOL: NOT DETECTED

## 2015-03-07 LAB — TROPONIN I
Troponin I: <0.03 ng/mL (ref <0.031)
Troponin I: 0.04 ng/mL — ABNORMAL HIGH (ref <0.031)

## 2015-03-07 LAB — ETHANOL

## 2015-03-07 MED ORDER — DEXTROSE 5 % IV SOLN: 5.0000 mg/h | INTRAVENOUS | Status: DC

## 2015-03-07 MED ORDER — DILTIAZEM LOAD VIA INFUSION
10.0000 mg | Freq: Once | INTRAVENOUS | Status: DC
  Filled 2015-03-07: qty 10

## 2015-03-07 MED ORDER — METOPROLOL TARTRATE 25 MG PO TABS
25.0000 mg | ORAL_TABLET | Freq: Once | ORAL | Status: AC
  Administered 2015-03-07: 25 mg via ORAL
  Filled 2015-03-07: qty 1

## 2015-03-07 MED ORDER — METOPROLOL TARTRATE 1 MG/ML IV SOLN
5.0000 mg | Freq: Once | INTRAVENOUS | Status: AC
  Administered 2015-03-07: 5 mg via INTRAVENOUS
  Filled 2015-03-07: qty 5

## 2015-03-07 MED ORDER — DABIGATRAN ETEXILATE MESYLATE 150 MG PO CAPS
150.0000 mg | ORAL_CAPSULE | ORAL | Status: AC
  Administered 2015-03-07: 150 mg via ORAL
  Filled 2015-03-07 (×2): qty 1

## 2015-03-07 MED ORDER — DABIGATRAN ETEXILATE MESYLATE 150 MG PO CAPS: 150.0000 mg | ORAL_CAPSULE | Freq: Two times a day (BID) | ORAL | Status: DC

## 2015-03-07 NOTE — ED Notes (Signed)
Dr Harrison at bedside

## 2015-03-07 NOTE — ED Notes (Signed)
The pt arrived by [facility] from home where the pt was found to be tachycardic. Hx w-p-w he was here Thursday night for the same he was admitted .  Surgery in the past years ago.  Chest pain with the tachycardia.  Hr is irregular  .  4 sl nitro did not relieve his pain

## 2015-03-07 NOTE — Telephone Encounter (Signed)
I saw him in hospital, spoke to [place] about setting up an ENB. Should be working on it. I don;t really need to see him in office, just need to set the procedure.

## 2015-03-07 NOTE — ED Provider Notes (Signed)
Care accepted from Dr. Sharol Given. Currently awaiting second troponin.  Second troponin was mildly elevated and cardiology was consult for evaluation. The patient is no longer having any chest pain. It is likely that he had some mild demand from the tachycardia earlier but he has remained asymptomatic in the ER. Cardiology recommends starting the patient on pradaxa 150 mg twice a day and follow-up with his cardiologist. Pt happy w/ this plan. Return precautions for recurrent chest pain, recurrent palpitations lasting greater than an hour, fever, shortness of breath.  Clinical Impression 1. Paroxysmal atrial fibrillation   2. Chest pain      Pamella Pert, MD 03/07/15 1208

## 2015-03-07 NOTE — Consult Note (Signed)
CONSULTATION NOTE  Reason for Consult: Chest pain, palpitations  Requesting Physician: Dr. Aline Brochure  Cardiologist: Cardiologist at the [facility]  HPI: This is a 64 y.o. male with a past medical history significant for coronary artery disease with PTCA and rotational atherectomy in 1997, as well as type 2 diabetes, hypertension, dyslipidemia, PAD and ongoing tobacco abuse. He does not report any intervention since that time. He was recently admitted just 3 days ago for similar chest pain and underwent a nuclear stress test which was negative. He was discharged home and then presented again with chest pain and palpitations. He was apparently recently placed on metoprolol but forgot to take his medicine last night. On the ambulance was found to be in atrial fibrillation with rapid ventricular response. He does not have a history of A. 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 He was initially rate controlled in the ER and he has now converted back to sinus rhythm. Recent stress test was negative for ischemia although there were some downsloping ST segment changes and T-wave inversions noted. LVH is noted. He does have diabetes, hypertension and PAD (right iliac artery occlusion) with normal LV function. His CHADSVASC score is therefore 3. It should also be noted that he recently was found to have a 2.6 cm spiculated nodule within the right medial aspect of the right lung concerning for bronchogenic carcinoma. He has scheduled follow-up with Dr. Lamonte Sakai. Cardiology is asked to consult and make recommendations for medical therapy.  PMHx:  Past Medical History  Diagnosis Date  . WPW (Wolff-Parkinson-White syndrome)     ablated  . DM (dermatomyositis)     x 10 years  . Hyperlipidemia     x 13 years  . Tobacco abuse   . CAD (coronary artery disease)     1997 LAD 95% stenosis. He had Rotablator of small  couple lesions in this astery. His last  stress perfusion study was in 2001 with no evidence of ischemia.  . Iliac artery occlusion, right   . Hypertension     x 13 years  . Anginal pain   . Type II diabetes mellitus   . Hepatitis A 1972    "in [employer]"   Past Surgical History  Procedure Laterality Date  . Small intestine surgery  ~ 1976    following ingestion of a fish bone  . Fracture surgery    . Colon surgery    . Hip fracture surgery Right 1976?    "have steel plate put in"  . Percutaneous coronary rotoblator intervention (pci-r)  ~ 1998  . Coronary angioplasty  ~ 1998  . Cataract extraction w/ intraocular lens  implant, bilateral Bilateral ~ 2002  . Radiofrequency ablation      for WPW    FAMHx: Family History  Problem Relation Age of Onset  . Diabetes Mother   . Depression Mother   . Coronary artery disease Father 40  . Lung cancer Father   . Cancer Father     SOCHx:  reports that he has been smoking Cigarettes.  He has a 94 pack-year smoking history. He has never used smokeless tobacco. He reports that he drinks about 2.4 oz of alcohol per week. He reports that he does not use illicit drugs.  ALLERGIES: Allergies  Allergen Reactions  . Codeine Itching    ROS: A comprehensive review of systems was negative except for: Cardiovascular: positive for chest pain and palpitations  HOME MEDICATIONS:   Medication List    ASK your doctor about these medications        aspirin 81 MG tablet  Take 81 mg by mouth daily.     atorvastatin 80 MG tablet  Commonly known as:  LIPITOR  Take 40 mg by mouth daily at 6 PM.     bisacodyl 5 MG EC tablet  Commonly known as:  DULCOLAX  Take 5 mg by mouth daily as needed for mild constipation or moderate constipation.     Cholecalciferol 2000 UNITS Tabs  Take 2,000 Units by mouth daily.     Fish Oil Oil  Take 1,000 mg by mouth 2 (two) times daily.     lisinopril 40 MG tablet  Commonly known as:  PRINIVIL,ZESTRIL  take 1 tablet by mouth once daily      loratadine 10 MG tablet  Commonly known as:  CLARITIN  Take 10 mg by mouth daily.     metFORMIN 500 MG tablet  Commonly known as:  GLUCOPHAGE  Take 1,000 mg by mouth 2 (two) times daily with a meal.     metoprolol 50 MG tablet  Commonly known as:  LOPRESSOR  Take 25 mg by mouth daily.     multivitamin tablet  Take 1 tablet by mouth daily.        HOSPITAL MEDICATIONS: I have reviewed the patient's current medications.  VITALS: Blood pressure 132/50, pulse 72, temperature 97.6 F (36.4 C), resp. rate 23, SpO2 99 %.  PHYSICAL EXAM: General appearance: alert, appears older than stated age, no distress and Thin Neck: no carotid bruit and no JVD Lungs: diminished breath sounds RUL Heart: regular rate and rhythm, S1, S2 normal, no murmur, click, rub or gallop Abdomen: soft, non-tender; bowel sounds normal; no masses,  no organomegaly and Scaphoid Extremities: extremities normal, atraumatic, no cyanosis or edema Pulses: 2+ and symmetric Skin: Skin color, texture, turgor normal. No rashes or lesions Neurologic: Grossly normal Psych: Mildly anxious  LABS: Results for orders placed or performed during the hospital encounter of 03/07/15 (from the past 48 hour(s))  CBC with Differential     Status: Abnormal   Collection Time: 03/07/15  5:43 AM  Result Value Ref Range   WBC 8.6 4.0 - 10.5 K/uL   RBC 4.29 4.22 - 5.81 MIL/uL   Hemoglobin 13.3 13.0 - 17.0 g/dL   HCT 38.1 (L) 39.0 - 52.0 %   MCV 88.8 78.0 - 100.0 fL   MCH 31.0 26.0 - 34.0 pg   MCHC 34.9 30.0 - 36.0 g/dL   RDW 12.7 11.5 - 15.5 %   Platelets 222 150 - 400 K/uL   Neutrophils Relative % 75 43 - 77 %   Neutro Abs 6.4 1.7 - 7.7 K/uL   Lymphocytes Relative 13 12 - 46 %   Lymphs Abs 1.1 0.7 - 4.0 K/uL   Monocytes Relative 10 3 - 12 %   Monocytes Absolute 0.9 0.1 - 1.0 K/uL   Eosinophils Relative 2 0 - 5 %   Eosinophils Absolute 0.2 0.0 - 0.7 K/uL   Basophils Relative 0 0 - 1 %   Basophils Absolute 0.0 0.0 - 0.1 K/uL   Comprehensive metabolic panel     Status: Abnormal   Collection Time: 03/07/15  5:43 AM  Result Value Ref Range   Sodium 129 (L) 135 - 145 mmol/L   Potassium 4.1 3.5 - 5.1 mmol/L   Chloride 97 (L) 101 - 111 mmol/L   CO2 22  22 - 32 mmol/L   Glucose, Bld 184 (H) 65 - 99 mg/dL   BUN 19 6 - 20 mg/dL   Creatinine, Ser 0.96 0.61 - 1.24 mg/dL   Calcium 9.3 8.9 - 10.3 mg/dL   Total Protein 6.3 (L) 6.5 - 8.1 g/dL   Albumin 3.2 (L) 3.5 - 5.0 g/dL   AST 23 15 - 41 U/L   ALT 28 17 - 63 U/L   Alkaline Phosphatase 76 38 - 126 U/L   Total Bilirubin 0.4 0.3 - 1.2 mg/dL   GFR calc non Af Amer >60 >60 mL/min   GFR calc Af Amer >60 >60 mL/min    Comment: (NOTE) The eGFR has been calculated using the CKD EPI equation. This calculation has not been validated in all clinical situations. eGFR's persistently <60 mL/min signify possible Chronic Kidney Disease.    Anion gap 10 5 - 15  Troponin I     Status: None   Collection Time: 03/07/15  5:43 AM  Result Value Ref Range   Troponin I <0.03 <0.031 ng/mL    Comment:        NO INDICATION OF MYOCARDIAL INJURY.   Ethanol     Status: None   Collection Time: 03/07/15  5:44 AM  Result Value Ref Range   Alcohol, Ethyl (B) <5 <5 mg/dL    Comment:        LOWEST DETECTABLE LIMIT FOR SERUM ALCOHOL IS 11 mg/dL FOR MEDICAL PURPOSES ONLY   Urine rapid drug screen (hosp performed)     Status: None   Collection Time: 03/07/15  6:00 AM  Result Value Ref Range   Opiates NONE DETECTED NONE DETECTED   Cocaine NONE DETECTED NONE DETECTED   Benzodiazepines NONE DETECTED NONE DETECTED   Amphetamines NONE DETECTED NONE DETECTED   Tetrahydrocannabinol NONE DETECTED NONE DETECTED   Barbiturates NONE DETECTED NONE DETECTED    Comment:        DRUG SCREEN FOR MEDICAL PURPOSES ONLY.  IF CONFIRMATION IS NEEDED FOR ANY PURPOSE, NOTIFY LAB WITHIN 5 DAYS.        LOWEST DETECTABLE LIMITS FOR URINE DRUG SCREEN Drug Class       Cutoff (ng/mL) Amphetamine       1000 Barbiturate      200 Benzodiazepine   037 Tricyclics       048 Opiates          300 Cocaine          300 THC              50   Urinalysis, Routine w reflex microscopic     Status: Abnormal   Collection Time: 03/07/15  6:00 AM  Result Value Ref Range   Color, Urine YELLOW YELLOW   APPearance CLEAR CLEAR   Specific Gravity, Urine 1.008 1.005 - 1.030   pH 6.0 5.0 - 8.0   Glucose, UA NEGATIVE NEGATIVE mg/dL   Hgb urine dipstick NEGATIVE NEGATIVE   Bilirubin Urine NEGATIVE NEGATIVE   Ketones, ur NEGATIVE NEGATIVE mg/dL   Protein, ur 30 (A) NEGATIVE mg/dL   Urobilinogen, UA 0.2 0.0 - 1.0 mg/dL   Nitrite NEGATIVE NEGATIVE   Leukocytes, UA NEGATIVE NEGATIVE  Urine microscopic-add on     Status: None   Collection Time: 03/07/15  6:00 AM  Result Value Ref Range   Squamous Epithelial / LPF RARE RARE   WBC, UA 0-2 <3 WBC/hpf   RBC / HPF 0-2 <3 RBC/hpf   Bacteria, UA RARE RARE  Troponin I     Status: Abnormal   Collection Time: 03/07/15  7:50 AM  Result Value Ref Range   Troponin I 0.04 (H) <0.031 ng/mL    Comment:        PERSISTENTLY INCREASED TROPONIN VALUES IN THE RANGE OF 0.04-0.49 ng/mL CAN BE SEEN IN:       -UNSTABLE ANGINA       -CONGESTIVE HEART FAILURE       -MYOCARDITIS       -CHEST TRAUMA       -ARRYHTHMIAS       -LATE PRESENTING MYOCARDIAL INFARCTION       -COPD   CLINICAL FOLLOW-UP RECOMMENDED.   CBG monitoring, ED     Status: Abnormal   Collection Time: 03/07/15  9:36 AM  Result Value Ref Range   Glucose-Capillary 118 (H) 65 - 99 mg/dL    IMAGING: Dg Chest Port 1 View  03/07/2015   CLINICAL DATA:  Chest pain.  Tachycardia.  Syncope tonight.  EXAM: PORTABLE CHEST - 1 VIEW  COMPARISON:  Radiographs and CT 03/03/2015  FINDINGS: Lungs remain hyperinflated with emphysema. The right apical spiculated densities better appreciated on CT. Bibasilar densities are similar to prior exam, on CT pulmonary nodules are seen. There is likely superimposed atelectasis. Heart  size and mediastinal contours are unchanged. No pleural effusion or pneumothorax.  IMPRESSION: 1. Emphysema. 2. Spiculated right apical nodule, better appreciated on prior CT. 3. Bibasilar densities, likely combination of pulmonary nodules and atelectasis.   Electronically Signed   By: Jeb Levering M.D.   On: 03/07/2015 05:25   EKG: Sinus rhythm with inferior and anterolateral repolarization abnormality and mild ST elevation, unchanged compared to prior EKG on 03/03/2015, except for the earlier EKG demonstrating A. fib with rapid ventricular response  HOSPITAL DIAGNOSES: Principal Problem:   Atrial fibrillation with rapid ventricular response Active Problems:   Type 2 diabetes mellitus   Hyperlipidemia   Essential hypertension   Coronary atherosclerosis   WOLFF (WOLFE)-PARKINSON-WHITE (WPW) SYNDROME   Chest pain   Tobacco abuse   Other emphysema   PAD (peripheral artery disease)   IMPRESSION: 1. Paroxysmal atrial fibrillation with rapid ventricular response, now in sinus - CHADSVASC 3 2. Borderline elevated troponin likely secondary to tachyarrhythmia, recent negative stress test 3. History of WPW with prior ablation  RECOMMENDATION: 1. Mr. Naas has PAF with rapid ventricular response. His CHADSVASC score is 3 and may be at increased risk for recurrence especially with his spiculated right lung mass which could be playing a role in his atrial fibrillation. His risk of stroke is between 4 and 5% per year. Based on this finding, I would recommend that he start on Pradaxa 150 mg twice a day. This is a preferred medication at the [place] and should be most cost effective. He should continue aspirin due to his underlying coronary disease. I'm not concerned about the borderline elevation in troponin given his recent negative stress test. He reports that his chest discomfort resolved once he converted back to sinus rhythm. He said his A. fib felt like when he had WPW years ago. There is no  evidence of persistent preexcitation is EKG. I agree with Lopressor and would advise him to take the half tablet (25 mg) twice daily. I feel that he can be safely discharged home with the above medical changes. Follow-up with his [place] provider in a few weeks.  Thank you for the consultation.  Time Spent Directly with Patient: 45 minutes  Chrissie Noa C.  Debara Pickett, MD, [facility] Attending Cardiologist [place] 03/07/2015, 10:33 AM

## 2015-03-07 NOTE — Telephone Encounter (Signed)
Spoke with patient, he wanted appointment with RB in a week, RB is booked up until end of July.  Patient says that he will need a Bronchoscopy done, so he wants to see a provider that does Bronchs.  He says that he would like to know RB's recommendations since he is familiar with his chart.    RB - please advise.

## 2015-03-07 NOTE — Discharge Instructions (Signed)
Chest Pain (Nonspecific) It is often hard to give a diagnosis for the cause of chest pain. There is always a chance that your pain could be related to something serious, such as a heart attack or a blood clot in the lungs. You need to follow up with your doctor. HOME CARE  If antibiotic medicine was given, take it as directed by your doctor. Finish the medicine even if you start to feel better.  For the next few days, avoid activities that bring on chest pain. Continue physical activities as told by your doctor.  Do not use any tobacco products. This includes cigarettes, chewing tobacco, and e-cigarettes.  Avoid drinking alcohol.  Only take medicine as told by your doctor.  Follow your doctor's suggestions for more testing if your chest pain does not go away.  Keep all doctor visits you made. GET HELP IF:  Your chest pain does not go away, even after treatment.  You have a rash with blisters on your chest.  You have a fever. GET HELP RIGHT AWAY IF:   You have more pain or pain that spreads to your arm, neck, jaw, back, or belly (abdomen).  You have shortness of breath.  You cough more than usual or cough up blood.  You have very bad back or belly pain.  You feel sick to your stomach (nauseous) or throw up (vomit).  You have very bad weakness.  You pass out (faint).  You have chills. This is an emergency. Do not wait to see if the problems will go away. Call your local emergency services (911 in [place] Do not drive yourself to the hospital. MAKE SURE YOU:   Understand these instructions.  Will watch your condition.  Will get help right away if you are not doing well or get worse. Document Released: 03/05/2008 Document Revised: 09/22/2013 Document Reviewed: 03/05/2008 [facility] Patient Information 2015 [place], [place]. This information is not intended to replace advice given to you by your health care provider. Make sure you discuss any questions you have with your  health care provider.  Atrial Fibrillation Atrial fibrillation is a type of irregular heart rhythm (arrhythmia). During atrial fibrillation, the upper chambers of the heart (atria) quiver continuously in a chaotic pattern. This causes an irregular and often rapid heart rate.  Atrial fibrillation is the result of the heart becoming overloaded with disorganized signals that tell it to beat. These signals are normally released one at a time by a part of the right atrium called the sinoatrial node. They then travel from the atria to the lower chambers of the heart (ventricles), causing the atria and ventricles to contract and pump blood as they pass. In atrial fibrillation, parts of the atria outside of the sinoatrial node also release these signals. This results in two problems. First, the atria receive so many signals that they do not have time to fully contract. Second, the ventricles, which can only receive one signal at a time, beat irregularly and out of rhythm with the atria.  There are three types of atrial fibrillation:   Paroxysmal. Paroxysmal atrial fibrillation starts suddenly and stops on its own within a week.  Persistent. Persistent atrial fibrillation lasts for more than a week. It may stop on its own or with treatment.  Permanent. Permanent atrial fibrillation does not go away. Episodes of atrial fibrillation may lead to permanent atrial fibrillation. Atrial fibrillation can prevent your heart from pumping blood normally. It increases your risk of stroke and can lead to  heart failure.  CAUSES   Heart conditions, including a heart attack, heart failure, coronary artery disease, and heart valve conditions.   Inflammation of the sac that surrounds the heart (pericarditis).  Blockage of an artery in the lungs (pulmonary embolism).  Pneumonia or other infections.  Chronic lung disease.  Thyroid problems, especially if the thyroid is overactive (hyperthyroidism).  Caffeine,  excessive alcohol use, and use of some illegal drugs.   Use of some medicines, including certain decongestants and diet pills.  Heart surgery.   Birth defects.  Sometimes, no cause can be found. When this happens, the atrial fibrillation is called lone atrial fibrillation. The risk of complications from atrial fibrillation increases if you have lone atrial fibrillation and you are age 31 years or older. RISK FACTORS  Heart failure.  Coronary artery disease.  Diabetes mellitus.   High blood pressure (hypertension).   Obesity.   Other arrhythmias.   Increased age. SIGNS AND SYMPTOMS   A feeling that your heart is beating rapidly or irregularly.   A feeling of discomfort or pain in your chest.   Shortness of breath.   Sudden light-headedness or weakness.   Getting tired easily when exercising.   Urinating more often than normal (mainly when atrial fibrillation first begins).  In paroxysmal atrial fibrillation, symptoms may start and suddenly stop. DIAGNOSIS  Your health care provider may be able to detect atrial fibrillation when taking your pulse. Your health care provider may have you take a test called an ambulatory electrocardiogram (ECG). An ECG records your heartbeat patterns over a 24-hour period. You may also have other tests, such as:  Transthoracic echocardiogram (TTE). During echocardiography, sound waves are used to evaluate how blood flows through your heart.  Transesophageal echocardiogram (TEE).  Stress test. There is more than one type of stress test. If a stress test is needed, ask your health care provider about which type is best for you.  Chest X-ray exam.  Blood tests.  Computed tomography (CT). TREATMENT  Treatment may include:  Treating any underlying conditions. For example, if you have an overactive thyroid, treating the condition may correct atrial fibrillation.  Taking medicine. Medicines may be given to control a rapid  heart rate or to prevent blood clots, heart failure, or a stroke.  Having a procedure to correct the rhythm of the heart:  Electrical cardioversion. During electrical cardioversion, a controlled, low-energy shock is delivered to the heart through your skin. If you have chest pain, very low blood pressure, or sudden heart failure, this procedure may need to be done as an emergency.  Catheter ablation. During this procedure, heart tissues that send the signals that cause atrial fibrillation are destroyed.  Surgical ablation. During this surgery, thin lines of heart tissue that carry the abnormal signals are destroyed. This procedure can either be an open-heart surgery or a minimally invasive surgery. With the minimally invasive surgery, small cuts are made to access the heart instead of a large opening.  Pulmonary venous isolation. During this surgery, tissue around the veins that carry blood from the lungs (pulmonary veins) is destroyed. This tissue is thought to carry the abnormal signals. HOME CARE INSTRUCTIONS   Take medicines only as directed by your health care provider. Some medicines can make atrial fibrillation worse or recur.  If blood thinners were prescribed by your health care provider, take them exactly as directed. Too much blood-thinning medicine can cause bleeding. If you take too little, you will not have the needed protection against  stroke and other problems.  Perform blood tests at home if directed by your health care provider. Perform blood tests exactly as directed.  Quit smoking if you smoke.  Do not drink alcohol.  Do not drink caffeinated beverages such as coffee, soda, and some teas. You may drink decaffeinated coffee, soda, or tea.   Maintain a healthy weight.Do not use diet pills unless your health care provider approves. They may make heart problems worse.   Follow diet instructions as directed by your health care provider.  Exercise regularly as directed  by your health care provider.  Keep all follow-up visits as directed by your health care provider. This is important. PREVENTION  The following substances can cause atrial fibrillation to recur:   Caffeinated beverages.  Alcohol.  Certain medicines, especially those used for breathing problems.  Certain herbs and herbal medicines, such as those containing ephedra or ginseng.  Illegal drugs, such as cocaine and amphetamines. Sometimes medicines are given to prevent atrial fibrillation from recurring. Proper treatment of any underlying condition is also important in helping prevent recurrence.  SEEK MEDICAL CARE IF:  You notice a change in the rate, rhythm, or strength of your heartbeat.  You suddenly begin urinating more frequently.  You tire more easily when exerting yourself or exercising. SEEK IMMEDIATE MEDICAL CARE IF:   You have chest pain, abdominal pain, sweating, or weakness.  You feel nauseous.  You have shortness of breath.  You suddenly have swollen feet and ankles.  You feel dizzy.  Your face or limbs feel numb or weak.  You have a change in your vision or speech. MAKE SURE YOU:   Understand these instructions.  Will watch your condition.  Will get help right away if you are not doing well or get worse. Document Released: 09/17/2005 Document Revised: 02/01/2014 Document Reviewed: 10/28/2012 [facility] Patient Information 2015 [place], [place]. This information is not intended to replace advice given to you by your health care provider. Make sure you discuss any questions you have with your health care provider.

## 2015-03-07 NOTE — ED Notes (Signed)
Pt alert skin  Warm and dry no distress

## 2015-03-07 NOTE — ED Provider Notes (Signed)
CSN: [id number]     Arrival date & time 03/07/15  0359 History   First MD Initiated Contact with Patient 03/07/15 [phone]     Chief Complaint  Patient presents with  . Tachycardia     (Consider location/radiation/quality/duration/timing/severity/associated sxs/prior Treatment) HPI 64 year old male presents to the emergency department with complaints of a squeezing chest pain starting about an hour prior to arrival, followed by palpitations.  Patient reports taking 4 nitroglycerin without improvement in his symptoms.  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  He reports history of coronary disease with balloon angioplasty in the late 90s.  EMS reports in route.  Patient was significantly tachycardic.  Patient was admitted on Thursday night with chest pain, did not have tachycardia at that time.  Patient was recently diagnosed with a lung mass for which she is to see pulmonology for bronchoscopy this week.  Patient noted to be in atrial fibrillation.  No prior history of same. Past Medical History  Diagnosis Date  . WPW (Wolff-Parkinson-White syndrome)     ablated  . DM (dermatomyositis)     x 10 years  . Hyperlipidemia     x 13 years  . Tobacco abuse   . CAD (coronary artery disease)     1997 LAD 95% stenosis. He had Rotablator of small  couple lesions in this astery. His last stress perfusion study was in 2001 with no evidence of ischemia.  . Iliac artery occlusion, right   . Hypertension     x 13 years  . Anginal pain   . Type II diabetes mellitus   . Hepatitis A 1972    "in [employer]"   Past Surgical History  Procedure Laterality Date  . Small intestine surgery  ~ 1976    following ingestion of a fish bone  . Fracture surgery    . Colon surgery    . Hip fracture surgery Right 1976?    "have steel plate put in"  . Percutaneous coronary rotoblator intervention (pci-r)  ~ 1998  . Coronary angioplasty  ~ 1998  . Cataract extraction w/ intraocular lens  implant, bilateral  Bilateral ~ 2002  . Radiofrequency ablation      for WPW   Family History  Problem Relation Age of Onset  . Diabetes Mother   . Depression Mother   . Coronary artery disease Father 86  . Lung cancer Father   . Cancer Father    History  Substance Use Topics  . Smoking status: Current Every Day Smoker -- 2.00 packs/day for 47 years    Types: Cigarettes  . Smokeless tobacco: Never Used  . Alcohol Use: 2.4 oz/week    0 Standard drinks or equivalent, 4 Cans of beer per week    Review of Systems  See History of Present Illness; otherwise all other systems are reviewed and negative   Allergies  Codeine  Home Medications   Prior to Admission medications   Medication Sig Start Date End Date Taking? Authorizing Provider  aspirin 81 MG tablet Take 81 mg by mouth daily.     Yes Historical Provider, MD  atorvastatin (LIPITOR) 80 MG tablet Take 40 mg by mouth daily at 6 PM.   Yes Historical Provider, MD  bisacodyl (DULCOLAX) 5 MG EC tablet Take 5 mg by mouth daily as needed for mild constipation or moderate constipation.   Yes Historical Provider, MD  Cholecalciferol 2000 UNITS TABS Take 2,000 Units by mouth daily.   Yes Historical Provider, MD  Fish Oil OIL Take 1,000 mg by mouth 2 (two) times daily.    Yes Historical Provider, MD  lisinopril (PRINIVIL,ZESTRIL) 40 MG tablet take 1 tablet by mouth once daily Patient taking differently: Take 20 mg daily. 07/28/11  Yes Minus Breeding, MD  loratadine (CLARITIN) 10 MG tablet Take 10 mg by mouth daily.     Yes Historical Provider, MD  metFORMIN (GLUCOPHAGE) 500 MG tablet Take 1,000 mg by mouth 2 (two) times daily with a meal.    Yes Historical Provider, MD  metoprolol (LOPRESSOR) 50 MG tablet Take 25 mg by mouth daily.   Yes Historical Provider, MD  Multiple Vitamin (MULTIVITAMIN) tablet Take 1 tablet by mouth daily.   Yes Historical Provider, MD   BP 145/64 mmHg  Pulse 86  Temp(Src) 97.6 F (36.4 C)  Resp 25  SpO2 94% Physical Exam   Constitutional: He is oriented to person, place, and time. He appears well-developed and well-nourished.  Chronically ill-appearing male, uncomfortable appearing  HENT:  Head: Normocephalic and atraumatic.  Nose: Nose normal.  Mouth/Throat: Oropharynx is clear and moist.  Eyes: Conjunctivae and EOM are normal. Pupils are equal, round, and reactive to light.  Neck: Normal range of motion. Neck supple. No JVD present. No tracheal deviation present. No thyromegaly present.  Cardiovascular: Normal heart sounds and intact distal pulses.  Exam reveals no gallop and no friction rub.   No murmur heard. Tachycardia with irregular rhythm  Pulmonary/Chest: Effort normal and breath sounds normal. No stridor. No respiratory distress. He has no wheezes. He has no rales. He exhibits no tenderness.  Abdominal: Soft. Bowel sounds are normal. He exhibits no distension and no mass. There is no tenderness. There is no rebound and no guarding.  Musculoskeletal: Normal range of motion. He exhibits no edema or tenderness.  Lymphadenopathy:    He has no cervical adenopathy.  Neurological: He is alert and oriented to person, place, and time. He displays normal reflexes. He exhibits normal muscle tone. Coordination normal.  Skin: Skin is warm and dry. No rash noted. No erythema. No pallor.  Psychiatric: He has a normal mood and affect. His behavior is normal. Judgment and thought content normal.  Nursing note and vitals reviewed.   ED Course  Procedures (including critical care time) Labs Review Labs Reviewed  URINALYSIS, ROUTINE W REFLEX MICROSCOPIC (NOT AT [facility]) - Abnormal; Notable for the following:    Protein, ur 30 (*)    All other components within normal limits  CBC WITH DIFFERENTIAL/PLATELET - Abnormal; Notable for the following:    HCT 38.1 (*)    All other components within normal limits  COMPREHENSIVE METABOLIC PANEL - Abnormal; Notable for the following:    Sodium 129 (*)    Chloride 97 (*)     Glucose, Bld 184 (*)    Total Protein 6.3 (*)    Albumin 3.2 (*)    All other components within normal limits  URINE RAPID DRUG SCREEN (HOSP PERFORMED) NOT AT [facility]  ETHANOL  TROPONIN I  URINE MICROSCOPIC-ADD ON    Imaging Review Dg Chest Port 1 View  03/07/2015   CLINICAL DATA:  Chest pain.  Tachycardia.  Syncope tonight.  EXAM: PORTABLE CHEST - 1 VIEW  COMPARISON:  Radiographs and CT 03/03/2015  FINDINGS: Lungs remain hyperinflated with emphysema. The right apical spiculated densities better appreciated on CT. Bibasilar densities are similar to prior exam, on CT pulmonary nodules are seen. There is likely superimposed atelectasis. Heart size and mediastinal contours are unchanged.  No pleural effusion or pneumothorax.  IMPRESSION: 1. Emphysema. 2. Spiculated right apical nodule, better appreciated on prior CT. 3. Bibasilar densities, likely combination of pulmonary nodules and atelectasis.   Electronically Signed   By: Jeb Levering M.D.   On: 03/07/2015 05:25     EKG Interpretation   Date/Time:  Monday March 07 2015 04:09:21 EDT Ventricular Rate:  106 PR Interval:    QRS Duration: 92 QT Interval:  345 QTC Calculation: 458 R Axis:   71 Text Interpretation:  Atrial fibrillation ST depr, consider ischemia,  inferior leads Borderline ST elevation, anterior leads Confirmed by Zondra Lawlor   MD, Yaron Grasse ([id number]) on 03/07/2015 4:57:53 AM      EKG Interpretation  Date/Time:  Monday March 07 2015 05:41:06 EDT Ventricular Rate:  95 PR Interval:  168 QRS Duration: 86 QT Interval:  358 QTC Calculation: 450 R Axis:   69 Text Interpretation:  Sinus rhythm ST elevation, consider anterior injury Confirmed by Franchesca Veneziano  MD, Manju Kulkarni ([id number]) on 03/07/2015 7:07:38 AM         MDM   Final diagnoses:  Chest pain  Paroxysmal atrial fibrillation    64 year old male with new onset atrial fibrillation.  Patient also with chest pain.  Patient is on metoprolol.  We'll plan to give an IV dose and see if this helps  with his rate.  Labs sent, chest x-ray ordered.   Patient has had resolution of his atrial fibrillation.  Pain has resolved.  Patient has a cardiologist that he plans to follow with the [place].  Awaiting second troponin.  As patient has bronchoscopy coming up to further evaluate his lung mass, do not feel at this time, he needs to start anticoagulation.  He can discuss this with his cardiologist..  Care passed to Dr. Aline Brochure awaiting second troponin.    Linton Flemings, MD 03/07/15 [phone]

## 2015-03-07 NOTE — Telephone Encounter (Signed)
Spoke with pt. He is aware that he does not need an appointment before [place]. Will await phone call from [facility] about [place].

## 2015-03-16 ENCOUNTER — Encounter (HOSPITAL_COMMUNITY): Payer: Self-pay

## 2015-03-16 ENCOUNTER — Ambulatory Visit (HOSPITAL_COMMUNITY)
Admission: RE | Admit: 2015-03-16 | Discharge: 2015-03-16 | Disposition: A | Discharge status: Home / Self Care | Payer: Medicare Other | Source: Ambulatory Visit | Attending: Emergency Medicine | Admitting: Emergency Medicine

## 2015-03-16 ENCOUNTER — Other Ambulatory Visit: Payer: Self-pay

## 2015-03-16 ENCOUNTER — Encounter (HOSPITAL_COMMUNITY)
Admission: RE | Admit: 2015-03-16 | Discharge: 2015-03-16 | Disposition: A | Discharge status: Home / Self Care | Payer: Medicare Other | Source: Ambulatory Visit | Attending: Orthopedic Surgery | Admitting: Orthopedic Surgery

## 2015-03-16 DIAGNOSIS — I10 Essential (primary) hypertension: Secondary | ICD-10-CM | POA: Diagnosis not present

## 2015-03-16 DIAGNOSIS — I2510 Atherosclerotic heart disease of native coronary artery without angina pectoris: Secondary | ICD-10-CM | POA: Diagnosis not present

## 2015-03-16 DIAGNOSIS — J449 Chronic obstructive pulmonary disease, unspecified: Secondary | ICD-10-CM | POA: Diagnosis not present

## 2015-03-16 DIAGNOSIS — E785 Hyperlipidemia, unspecified: Secondary | ICD-10-CM | POA: Diagnosis not present

## 2015-03-16 DIAGNOSIS — C3411 Malignant neoplasm of upper lobe, right bronchus or lung: Secondary | ICD-10-CM | POA: Diagnosis not present

## 2015-03-16 DIAGNOSIS — Z9889 Other specified postprocedural states: Secondary | ICD-10-CM

## 2015-03-16 DIAGNOSIS — Z87891 Personal history of nicotine dependence: Secondary | ICD-10-CM | POA: Diagnosis not present

## 2015-03-16 DIAGNOSIS — E119 Type 2 diabetes mellitus without complications: Secondary | ICD-10-CM | POA: Diagnosis not present

## 2015-03-16 LAB — CBC
HEMATOCRIT: 38.1 % — AB (ref 39.0–52.0)
HEMOGLOBIN: 13.2 g/dL (ref 13.0–17.0)
MCH: 31 pg (ref 26.0–34.0)
MCHC: 34.6 g/dL (ref 30.0–36.0)
MCV: 89.4 fL (ref 78.0–100.0)
Platelets: 252 10*3/uL (ref 150–400)
RBC: 4.26 MIL/uL (ref 4.22–5.81)
RDW: 13 % (ref 11.5–15.5)
WBC: 7 10*3/uL (ref 4.0–10.5)

## 2015-03-16 LAB — COMPREHENSIVE METABOLIC PANEL
ALT: 20 U/L (ref 17–63)
ANION GAP: 9 (ref 5–15)
AST: 23 U/L (ref 15–41)
Albumin: 3.7 g/dL (ref 3.5–5.0)
Alkaline Phosphatase: 79 U/L (ref 38–126)
BUN: 12 mg/dL (ref 6–20)
CALCIUM: 9.3 mg/dL (ref 8.9–10.3)
CO2: 23 mmol/L (ref 22–32)
Chloride: 95 mmol/L — ABNORMAL LOW (ref 101–111)
Creatinine, Ser: 0.81 mg/dL (ref 0.61–1.24)
GFR calc non Af Amer: >60 mL/min (ref >60)
GLUCOSE: 112 mg/dL — AB (ref 65–99)
Potassium: 4.8 mmol/L (ref 3.5–5.1)
Sodium: 127 mmol/L — ABNORMAL LOW (ref 135–145)
TOTAL PROTEIN: 6.8 g/dL (ref 6.5–8.1)
Total Bilirubin: 0.4 mg/dL (ref 0.3–1.2)

## 2015-03-16 LAB — PROTIME-INR
INR: 1.58 — ABNORMAL HIGH (ref 0.00–1.49)
Prothrombin Time: 18.9 seconds — ABNORMAL HIGH (ref 11.6–15.2)

## 2015-03-16 LAB — APTT: aPTT: 63 seconds — ABNORMAL HIGH (ref 24–37)

## 2015-03-16 LAB — GLUCOSE, CAPILLARY: Glucose-Capillary: 132 mg/dL — ABNORMAL HIGH (ref 65–99)

## 2015-03-16 NOTE — Pre-Procedure Instructions (Signed)
    Grae Cannata Hammerstrom  03/16/2015      [id number], [place] [address] Phone: [phone] Fax: [phone]    Your procedure is scheduled on Wednesday, March 23, 2015 at 8:30 AM  Report to [facility] Admitting at 6:30 A.M.  Call this number if you have problems the morning of surgery:  [phone]   Remember:  Do not eat food or drink liquids after midnight.   Take these medicines the morning of surgery with A SIP OF WATER Loratadine (Claritin), Metoprolol (Lopressor).  Stop taking: Aspirin, Ibuprofen, Aleve, Naproxen, BC's, Goody's, Fish Oil, and herbal medications.   Do not wear jewelry.  Do not wear lotions, powders, or colognes.  You may wear deodorant.  Do not shave 48 hours prior to surgery.  Men may shave face and neck.  Do not bring valuables to the hospital.  [facility] is not responsible for any belongings or valuables.  Contacts, dentures or bridgework may not be worn into surgery.  Leave your suitcase in the car.  After surgery it may be brought to your room.  For patients admitted to the hospital, discharge time will be determined by your treatment team.  Patients discharged the day of surgery will not be allowed to drive home.    Special instructions:  See attached.   Please read over the following fact sheets that you were given. Pain Booklet, Coughing and Deep Breathing and Surgical Site Infection Prevention

## 2015-03-16 NOTE — Progress Notes (Signed)
   03/16/15 1502  OBSTRUCTIVE SLEEP APNEA  Have you ever been diagnosed with sleep apnea through a sleep study? No  Do you snore loudly (loud enough to be heard through closed doors)?  0  Do you often feel tired, fatigued, or sleepy during the daytime? 1  Has anyone observed you stop breathing during your sleep? 0  Do you have, or are you being treated for high blood pressure? 1  BMI more than 35 kg/m2? 0  Neck circumference greater than 40 cm/16 inches? 1  Gender: 1  Obstructive Sleep Apnea Score 5

## 2015-03-16 NOTE — Progress Notes (Signed)
Patient states that he does not check his blood sugars at home because he is afraid of the results.

## 2015-03-16 NOTE — Progress Notes (Signed)
PCP- [facility]  Cardiologist - [facility], Dr. Carney Bern  Echo- 03/04/15  Stress- 03/04/15  CXR - 03/16/15  EKG - 03/16/15

## 2015-03-16 NOTE — Progress Notes (Signed)
Notified Dr. Malvin Johns that patient had been started on Pradaxa.  Per MD, informed patient to hold Pradaxa until after procedure and MD would discuss with patient at that time.  Pt. Verbalized understanding.

## 2015-03-16 NOTE — Progress Notes (Signed)
Anesthesia Chart Review:  Patient is a 64 year old male scheduled for video bronchoscopy with EBUS on 03/23/15 by Dr. Lamonte Hurst.  History includes smoking, WPW s/p radiofrequency ablation '97, CAD/MI s/p PTCA/rotational atherectomy ~ '98, HLD, HTN, DM2, PAD with right iliac artery occlusive disease, hepatitis A, dermatomyositis, small intestine surgery.  PCP is Dr. Bernell Hurst with the [facility] in [place]. Primary cardiologist is Dr. Carney Hurst also with the [facility] in [place]. Patient was admitted to [facility] on 03/03/15 for chest pain.  He was seen by [facility] and underwent a stress and echo (see below).  He had an incidental finding of right lung apex spiculated lesion with right suprahilar and pretracheal LN enlargement worrisome for bronchogenic carcinoma with possible metastatic disease. Dr. Lamonte Hurst recommended an out-patient bronchoscopy in hopes to get a definitive diagnosis. He was discharged with plans for out-patient PCP and cardiology follow-up at the [facility] which aren't scheduled until after his surgery.  Of note, on 03/07/15 he was brought back to [facility] ED with chest pain and palpitations.  He had missed his evening metoprolol dose.  Ambulance EKG showed afib with RVR with rate down to 106 bpm on ED arrival.  He converted to SR in IV metoprolol.  Troponin was < 0.03 - 0.04. Cardiologist Dr. Debara Hurst was consulted. His recommendations were: Mr. Mark Hurst has PAF with rapid ventricular response. His CHADSVASC score is 3 and may be at increased risk for recurrence especially with his spiculated right lung mass which could be playing a role in his atrial fibrillation. His risk of stroke is between 4 and 5% per year. Based on this finding, I would recommend that he start on Pradaxa 150 mg twice a day. This is a preferred medication at the [place] and should be most cost effective. He should continue aspirin due to his underlying coronary disease. I'm not concerned about the borderline elevation in  troponin given his recent negative stress test. He reports that his chest discomfort resolved once he converted back to sinus rhythm. He said his A. fib felt like when he had WPW years ago. There is no evidence of persistent preexcitation is EKG. I agree with Lopressor and would advise him to take the half tablet (25 mg) twice daily. I feel that he can be safely discharged home with the above medical changes. Follow-up with his [place] provider in a few weeks.  Medications include ASA '81mg'$ , Lipitor, Pradaxa, fish oil, lisinopril, Claritin, metformin, metoprolol. Dr. Brock Hurst was notified that patient was on Pradaxa.  He instructed patient to hold until after his procedure.  Initial EKG in ED on 03/07/15 showed afib at 106 bpm, ST depression, consider inferior ischemia, borderline ST elevation in anterior leads. Follow-up EKG on 03/07/15 showed ST at 95 bpm, anterior ST elevation.  PAT HR 78.  03/04/15 Nuclear stress test: Study Impression: Myocardial perfusion is normal. The study is normal. This is a low risk study. Overall left ventricular systolic function was normal. LV cavity size is normal. The left ventricular ejection fraction is normal (55-65%). No ischemia. Diaphragm attenuation. Septal hypokinesia.  03/04/15 Echo: - Left ventricle: The cavity size was normal. Wall thickness was increased increased in a pattern of mild to moderate LVH. The estimated ejection fraction was 60%. Wall motion was normal; there were no regional wall motion abnormalities. - Aortic valve: Not sure if two or three cusps. The more anterior cusp may dome. There is very slight gradient across the valve. Trivial AI. - Right ventricle: The cavity size  was normal. Systolic function was normal.  03/07/15 1V CXR: IMPRESSION: 1. Emphysema. 2. Spiculated right apical nodule, better appreciated on prior CT. 3. Bibasilar densities, likely combination of pulmonary nodules and atelectasis.  Preoperative labs noted. Na 127,  previously 126-129 from 03/03/15 - 03/07/15. Cr 0.81, glucose 112. H/H 13.2/38.1.  PT/INR 18.9/1.58, PTT 63.   He will need repeat labs (BMET to evaluate for hyponatremia, PT/PTT) to ensure follow-up results are acceptable for OR. In regards to his cardiac history, I discussed this history with anesthesiologist Dr. Deatra Hurst with no additional recommendations once Pradaxa instructions clarified with surgeon which was already done. If labs are acceptable and no acute changes then would anticipate that he could proceed as planned.  I am sending a staff message to Dr. Lamonte Hurst to address pre-operative labs in the interim.    Mark Hurst [facility] Short Stay Center/Anesthesiology Phone [phone] 03/16/2015 5:40 PM

## 2015-03-17 ENCOUNTER — Telehealth: Payer: Self-pay | Admitting: Emergency Medicine

## 2015-03-17 LAB — HEMOGLOBIN A1C
Hgb A1c MFr Bld: 7.3 % — ABNORMAL HIGH (ref 4.8–5.6)
MEAN PLASMA GLUCOSE: 163 mg/dL

## 2015-03-17 NOTE — Telephone Encounter (Signed)
Spoke with Alice-PA.  States she sent RB a staff message re: pt's labwork.  States pt has recently started pradaxa, his labs were abnormal.   Alice wants to make sure that Delmar sees her staff message re: pt's labs and pending bronch.  RB please advise if anything further is needed.  Thanks!

## 2015-03-17 NOTE — Telephone Encounter (Signed)
Please let him know that he will monitor his heart rate and atrial fibrillation continuously during the case. The procedure should not put a significant amount of stress or changes heart rate in any dangerous fashion. If there is evidence of heart strain then we would stop and reassess him. I think that is safe rest proceed. I would like to leave him off Eliquis until we clear him and are ready to restart after the procedure.

## 2015-03-17 NOTE — Telephone Encounter (Signed)
Spoke with pt, states he was recently dx'ed with afib and wants to know if his scheduled bronch will put stress on his heart.   RB please advise.  Thanks!

## 2015-03-17 NOTE — Telephone Encounter (Signed)
Thank you I will review 

## 2015-03-18 NOTE — Telephone Encounter (Signed)
Pt states to let you know that he is taking Pradaxa and not Eliquis. He states he does feel reassured since we gave him your response. Nothing further needed at this time.

## 2015-03-18 NOTE — Telephone Encounter (Signed)
Patient called requesting to speak to someone regarding his call yesterday to see what Dr. Lamonte Sakai advises.

## 2015-03-23 ENCOUNTER — Ambulatory Visit (HOSPITAL_COMMUNITY)
Admission: RE | Admit: 2015-03-23 | Discharge: 2015-03-23 | Disposition: A | Discharge status: Home / Self Care | Payer: Medicare Other | Source: Ambulatory Visit | Attending: Emergency Medicine | Admitting: Emergency Medicine

## 2015-03-23 ENCOUNTER — Encounter (HOSPITAL_COMMUNITY)
Admission: RE | Disposition: A | Discharge status: Home / Self Care | Payer: Self-pay | Source: Ambulatory Visit | Attending: Emergency Medicine

## 2015-03-23 ENCOUNTER — Encounter (HOSPITAL_COMMUNITY): Payer: Self-pay | Admitting: Anesthesiology

## 2015-03-23 ENCOUNTER — Ambulatory Visit (HOSPITAL_COMMUNITY): Payer: Medicare Other

## 2015-03-23 ENCOUNTER — Ambulatory Visit (HOSPITAL_COMMUNITY): Payer: Medicare Other | Admitting: Anesthesiology

## 2015-03-23 ENCOUNTER — Ambulatory Visit (HOSPITAL_COMMUNITY): Payer: Medicare Other | Admitting: Vascular Surgery

## 2015-03-23 DIAGNOSIS — R918 Other nonspecific abnormal finding of lung field: Secondary | ICD-10-CM | POA: Diagnosis present

## 2015-03-23 DIAGNOSIS — J449 Chronic obstructive pulmonary disease, unspecified: Secondary | ICD-10-CM | POA: Insufficient documentation

## 2015-03-23 DIAGNOSIS — Z87891 Personal history of nicotine dependence: Secondary | ICD-10-CM | POA: Insufficient documentation

## 2015-03-23 DIAGNOSIS — I2510 Atherosclerotic heart disease of native coronary artery without angina pectoris: Secondary | ICD-10-CM | POA: Insufficient documentation

## 2015-03-23 DIAGNOSIS — I10 Essential (primary) hypertension: Secondary | ICD-10-CM | POA: Insufficient documentation

## 2015-03-23 DIAGNOSIS — C3411 Malignant neoplasm of upper lobe, right bronchus or lung: Secondary | ICD-10-CM | POA: Diagnosis not present

## 2015-03-23 DIAGNOSIS — E785 Hyperlipidemia, unspecified: Secondary | ICD-10-CM | POA: Diagnosis not present

## 2015-03-23 DIAGNOSIS — Z419 Encounter for procedure for purposes other than remedying health state, unspecified: Secondary | ICD-10-CM

## 2015-03-23 DIAGNOSIS — E119 Type 2 diabetes mellitus without complications: Secondary | ICD-10-CM | POA: Diagnosis not present

## 2015-03-23 DIAGNOSIS — Z720 Tobacco use: Secondary | ICD-10-CM | POA: Diagnosis present

## 2015-03-23 DIAGNOSIS — R911 Solitary pulmonary nodule: Secondary | ICD-10-CM | POA: Diagnosis present

## 2015-03-23 DIAGNOSIS — Z9889 Other specified postprocedural states: Secondary | ICD-10-CM

## 2015-03-23 HISTORY — PX: VIDEO BRONCHOSCOPY WITH ENDOBRONCHIAL NAVIGATION: SHX6175

## 2015-03-23 LAB — PROTIME-INR
INR: 0.96 (ref 0.00–1.49)
Prothrombin Time: 13 seconds (ref 11.6–15.2)

## 2015-03-23 LAB — BASIC METABOLIC PANEL
Anion gap: 9 (ref 5–15)
BUN: 12 mg/dL (ref 6–20)
CO2: 25 mmol/L (ref 22–32)
Calcium: 9.4 mg/dL (ref 8.9–10.3)
Chloride: 92 mmol/L — ABNORMAL LOW (ref 101–111)
Creatinine, Ser: 0.87 mg/dL (ref 0.61–1.24)
GFR calc non Af Amer: >60 mL/min (ref >60)
Glucose, Bld: 112 mg/dL — ABNORMAL HIGH (ref 65–99)
Potassium: 5.2 mmol/L — ABNORMAL HIGH (ref 3.5–5.1)
SODIUM: 126 mmol/L — AB (ref 135–145)

## 2015-03-23 LAB — APTT: APTT: 33 s (ref 24–37)

## 2015-03-23 LAB — GLUCOSE, CAPILLARY
GLUCOSE-CAPILLARY: 111 mg/dL — AB (ref 65–99)
Glucose-Capillary: 103 mg/dL — ABNORMAL HIGH (ref 65–99)

## 2015-03-23 SURGERY — VIDEO BRONCHOSCOPY WITH ENDOBRONCHIAL NAVIGATION
Anesthesia: General

## 2015-03-23 MED ORDER — MIDAZOLAM HCL 5 MG/5ML IJ SOLN
INTRAMUSCULAR | Status: DC | PRN
  Administered 2015-03-23 (×2): 1 mg via INTRAVENOUS

## 2015-03-23 MED ORDER — NEOSTIGMINE METHYLSULFATE 10 MG/10ML IV SOLN
INTRAVENOUS | Status: AC
Start: 2015-03-23 — End: 2015-03-23
  Filled 2015-03-23: qty 1

## 2015-03-23 MED ORDER — MIDAZOLAM HCL 2 MG/2ML IJ SOLN
INTRAMUSCULAR | Status: AC
  Filled 2015-03-23: qty 2

## 2015-03-23 MED ORDER — SUCCINYLCHOLINE CHLORIDE 20 MG/ML IJ SOLN
INTRAMUSCULAR | Status: AC
  Filled 2015-03-23: qty 1

## 2015-03-23 MED ORDER — LIDOCAINE HCL (CARDIAC) 20 MG/ML IV SOLN
INTRAVENOUS | Status: AC
  Filled 2015-03-23: qty 5

## 2015-03-23 MED ORDER — PROPOFOL 10 MG/ML IV BOLUS
INTRAVENOUS | Status: DC | PRN
  Administered 2015-03-23: 120 mg via INTRAVENOUS

## 2015-03-23 MED ORDER — SODIUM CHLORIDE 0.9 % IJ SOLN
INTRAMUSCULAR | Status: AC
  Filled 2015-03-23: qty 10

## 2015-03-23 MED ORDER — DABIGATRAN ETEXILATE MESYLATE 150 MG PO CAPS: 150.0000 mg | ORAL_CAPSULE | Freq: Two times a day (BID) | ORAL | Status: DC

## 2015-03-23 MED ORDER — ONDANSETRON HCL 4 MG/2ML IJ SOLN
INTRAMUSCULAR | Status: DC | PRN
  Administered 2015-03-23: 4 mg via INTRAVENOUS

## 2015-03-23 MED ORDER — PROPOFOL 10 MG/ML IV BOLUS
INTRAVENOUS | Status: AC
  Filled 2015-03-23: qty 20

## 2015-03-23 MED ORDER — EPINEPHRINE HCL 1 MG/ML IJ SOLN
INTRAMUSCULAR | Status: AC
  Filled 2015-03-23: qty 1

## 2015-03-23 MED ORDER — 0.9 % SODIUM CHLORIDE (POUR BTL) OPTIME
TOPICAL | Status: DC | PRN
  Administered 2015-03-23: 1000 mL

## 2015-03-23 MED ORDER — LIDOCAINE HCL (CARDIAC) 20 MG/ML IV SOLN
INTRAVENOUS | Status: DC | PRN
  Administered 2015-03-23: 50 mg via INTRAVENOUS

## 2015-03-23 MED ORDER — LISINOPRIL 40 MG PO TABS: ORAL_TABLET | ORAL | Status: DC

## 2015-03-23 MED ORDER — PHENYLEPHRINE HCL 10 MG/ML IJ SOLN
10.0000 mg | INTRAVENOUS | Status: DC | PRN
  Administered 2015-03-23: 20 ug/min via INTRAVENOUS

## 2015-03-23 MED ORDER — GLYCOPYRROLATE 0.2 MG/ML IJ SOLN
INTRAMUSCULAR | Status: AC
  Filled 2015-03-23: qty 3

## 2015-03-23 MED ORDER — GLYCOPYRROLATE 0.2 MG/ML IJ SOLN
INTRAMUSCULAR | Status: DC | PRN
  Administered 2015-03-23: 0.4 mg via INTRAVENOUS

## 2015-03-23 MED ORDER — FENTANYL CITRATE (PF) 100 MCG/2ML IJ SOLN
INTRAMUSCULAR | Status: DC | PRN
Start: 2015-03-23 — End: 2015-03-23
  Administered 2015-03-23 (×5): 50 ug via INTRAVENOUS

## 2015-03-23 MED ORDER — EPHEDRINE SULFATE 50 MG/ML IJ SOLN
INTRAMUSCULAR | Status: AC
  Filled 2015-03-23: qty 1

## 2015-03-23 MED ORDER — ARTIFICIAL TEARS OP OINT
TOPICAL_OINTMENT | OPHTHALMIC | Status: AC
  Filled 2015-03-23: qty 3.5

## 2015-03-23 MED ORDER — LIDOCAINE HCL 4 % MT SOLN
OROMUCOSAL | Status: DC | PRN
  Administered 2015-03-23: 3 mL via TOPICAL

## 2015-03-23 MED ORDER — ROCURONIUM BROMIDE 100 MG/10ML IV SOLN
INTRAVENOUS | Status: DC | PRN
  Administered 2015-03-23: 30 mg via INTRAVENOUS

## 2015-03-23 MED ORDER — ROCURONIUM BROMIDE 50 MG/5ML IV SOLN
INTRAVENOUS | Status: AC
  Filled 2015-03-23: qty 1

## 2015-03-23 MED ORDER — ARTIFICIAL TEARS OP OINT
TOPICAL_OINTMENT | OPHTHALMIC | Status: DC | PRN
Start: 2015-03-23 — End: 2015-03-23
  Administered 2015-03-23: 1 via OPHTHALMIC

## 2015-03-23 MED ORDER — PHENYLEPHRINE HCL 10 MG/ML IJ SOLN
INTRAMUSCULAR | Status: DC | PRN
Start: 2015-03-23 — End: 2015-03-23
  Administered 2015-03-23 (×2): 80 ug via INTRAVENOUS
  Administered 2015-03-23: 120 ug via INTRAVENOUS
  Administered 2015-03-23: 40 ug via INTRAVENOUS

## 2015-03-23 MED ORDER — LACTATED RINGERS IV SOLN
INTRAVENOUS | Status: DC | PRN
  Administered 2015-03-23: 08:00:00 via INTRAVENOUS

## 2015-03-23 MED ORDER — NEOSTIGMINE METHYLSULFATE 10 MG/10ML IV SOLN
INTRAVENOUS | Status: DC | PRN
  Administered 2015-03-23: 3 mg via INTRAVENOUS

## 2015-03-23 MED ORDER — FENTANYL CITRATE (PF) 250 MCG/5ML IJ SOLN
INTRAMUSCULAR | Status: AC
  Filled 2015-03-23: qty 5

## 2015-03-23 MED ORDER — PHENYLEPHRINE 40 MCG/ML (10ML) SYRINGE FOR IV PUSH (FOR BLOOD PRESSURE SUPPORT)
PREFILLED_SYRINGE | INTRAVENOUS | Status: AC
  Filled 2015-03-23: qty 10

## 2015-03-23 SURGICAL SUPPLY — 35 items
BRUSH BIOPSY BRONCH 10 SDTNB (MISCELLANEOUS) ×2 IMPLANT
BRUSH BIOPSY BRONCH 10MM SDTNB (MISCELLANEOUS) ×1
BRUSH CYTOL CELLEBRITY 1.5X140 (MISCELLANEOUS) ×3 IMPLANT
BRUSH SUPERTRAX BIOPSY (INSTRUMENTS) IMPLANT
BRUSH SUPERTRAX NDL-TIP CYTO (INSTRUMENTS) ×6 IMPLANT
CANISTER SUCTION 2500CC (MISCELLANEOUS) ×3 IMPLANT
CHANNEL WORK EXTEND EDGE 180 (KITS) IMPLANT
CHANNEL WORK EXTEND EDGE 45 (KITS) IMPLANT
CHANNEL WORK EXTEND EDGE 90 (KITS) IMPLANT
CONT SPEC 4OZ CLIKSEAL STRL BL (MISCELLANEOUS) ×3 IMPLANT
COVER TABLE BACK 60X90 (DRAPES) ×3 IMPLANT
FILTER STRAW FLUID ASPIR (MISCELLANEOUS) IMPLANT
FORCEPS BIOP SUPERTRX PREMAR (INSTRUMENTS) ×3 IMPLANT
GAUZE SPONGE 4X4 12PLY STRL (GAUZE/BANDAGES/DRESSINGS) ×3 IMPLANT
GLOVE BIO SURGEON STRL SZ7.5 (GLOVE) ×6 IMPLANT
KIT CLEAN ENDO COMPLIANCE (KITS) ×3 IMPLANT
KIT LOCATABLE GUIDE (CANNULA) IMPLANT
KIT MARKER FIDUCIAL DELIVERY (KITS) IMPLANT
KIT PROCEDURE EDGE 180 (KITS) ×3 IMPLANT
KIT PROCEDURE EDGE 45 (KITS) IMPLANT
KIT PROCEDURE EDGE 90 (KITS) IMPLANT
KIT ROOM TURNOVER OR (KITS) ×3 IMPLANT
MARKER SKIN DUAL TIP RULER LAB (MISCELLANEOUS) ×3 IMPLANT
NEEDLE SUPERTRX PREMARK BIOPSY (NEEDLE) IMPLANT
NS IRRIG 1000ML POUR BTL (IV SOLUTION) ×3 IMPLANT
OIL SILICONE PENTAX (PARTS (SERVICE/REPAIRS)) ×3 IMPLANT
PAD ARMBOARD 7.5X6 YLW CONV (MISCELLANEOUS) ×6 IMPLANT
PATCHES PATIENT (LABEL) ×3 IMPLANT
SYR 20CC LL (SYRINGE) ×3 IMPLANT
SYR 20ML ECCENTRIC (SYRINGE) ×3 IMPLANT
SYR 50ML SLIP (SYRINGE) ×3 IMPLANT
TOWEL OR 17X24 6PK STRL BLUE (TOWEL DISPOSABLE) ×3 IMPLANT
TRAP SPECIMEN MUCOUS 40CC (MISCELLANEOUS) IMPLANT
TUBE CONNECTING 20'X1/4 (TUBING) ×1
TUBE CONNECTING 20X1/4 (TUBING) ×2 IMPLANT

## 2015-03-23 NOTE — Transfer of Care (Signed)
Immediate Anesthesia Transfer of Care Note  Patient: [name]  Procedure(s) Performed: Procedure(s): VIDEO BRONCHOSCOPY WITH ENDOBRONCHIAL NAVIGATION (N/A)  Patient Location: PACU  Anesthesia Type:General  Level of Consciousness: awake, alert  and oriented  Airway & Oxygen Therapy: Patient Spontanous Breathing and Patient connected to nasal cannula oxygen  Post-op Assessment: Report given to RN and Post -op Vital signs reviewed and stable  Post vital signs: Reviewed and stable  Last Vitals:  Filed Vitals:   03/23/15 0635  BP: 147/71  Pulse: 74  Temp: 36.4 C  Resp: 18    Complications: No apparent anesthesia complications

## 2015-03-23 NOTE — H&P (View-Only) (Signed)
Name: Mark Hurst MRN: [id number] DOB: 1951-02-15    ADMISSION DATE:  03/03/2015 CONSULTATION DATE:  03/03/2015  REFERRING MD :  Dyann Kief  CHIEF COMPLAINT:  Lung lesions  BRIEF PATIENT DESCRIPTION: 64 year old male who presented to [facility] 6/2 complaining of chest pain without relief from nitroglycerine. CT chest was ordered and showed several pulmonary nodules. PCCM consulted for tissue sampling.   SIGNIFICANT EVENTS    STUDIES:  CT chest 6/2 > Advanced centrilobular emphysematous change with an approximately 2.6 cm spiculated nodule within the medial aspect of the right lung apex worrisome for bronchogenic carcinoma. Borderline enlarged right suprahilar and high pretracheal lymph nodes.Additional scattered bilateral indeterminate punctate pulmonary nodules, the largest of which within the right lower lobe measures 1.6 cm in diameter.  Moderate to large amount of mixed calcified and noncalcified atherosclerotic plaque within a normal caliber thoracic aorta. Suspected hemodynamically significant narrowing involving the origin of the left common carotid artery. Moderate to large amount of mixed calcified and noncalcified atherosclerotic plaque within a mildly aneurysmal abdominal aorta measuring approximately 3.3 cm in diameter, incompletely imaged.   HISTORY OF PRESENT ILLNESS:  64 year old male, smoker, with PMH of WPW syndrome with ablation in 1997, dermatomyositis, hyperlipidemia, CAD, Iliac areter occlusion, HTN, and DM. He has had intermittent chest pain for the past 1-2 weeks. Over that time period, CP has been resolved with sublingual nitroglycerine. Pain was described as pressure with palpitations which occurr routinely during morning routine and straining with BM. 6/2 pain happened again during same routine, however, it was not resolved with nitroglycerine. He presented to ED with these complaints. CT scan was obtained in ED which showed several lesions, pulmonary nodules. Admitted to [facility], PCCM  to consult for further eval. 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 It was not uncommon to find insects, rodents, and bird/chicken droppings in these areas. He also used pesticides while down there and did not always wear mask.    SUBJECTIVE:   VITAL SIGNS: Temp:  [97.3 F (36.3 C)-98 F (36.7 C)] 97.9 F (36.6 C) (06/03 0349) Pulse Rate:  [53-97] 97 (06/03 0349) Resp:  [13-26] 18 (06/03 0349) BP: (116-173)/(48-100) 155/59 mmHg (06/03 0349) SpO2:  [92 %-98 %] 95 % (06/03 0349) Weight:  [137 lb 3.2 oz (62.234 kg)-137 lb 5.6 oz (62.3 kg)] 137 lb 5.6 oz (62.3 kg) (06/03 0349)  PHYSICAL EXAMINATION: General:  Male of normal body habitus in NAD. When are you going to do bronch? Neuro:  Alert,oriented, non-focal HEENT:  [place]/AT, no JVD noted, PERRL Cardiovascular:  RRR, no MRG Lungs:  Clear bilateral breath sounds, no SOB Abdomen:  Soft, non-tender, non-distended Musculoskeletal:  No acute deformity or ROM limitation Skin:  Grossly intact   Recent Labs Lab 03/03/15 0952 03/04/15 0452  NA 126* 127*  K 5.3* 4.7  CL 92* 96*  CO2 26 21*  BUN 11 10  CREATININE 0.88 0.82  GLUCOSE 193* 191*    Recent Labs Lab 03/03/15 0952 03/04/15 0452  HGB 13.2 12.1*  HCT 37.6* 34.6*  WBC 7.6 7.6  PLT 250 248   Dg Chest 2 View  03/03/2015   CLINICAL DATA:  Acute chest pain.  EXAM: CHEST  2 VIEW  COMPARISON:  None.  FINDINGS: The heart size and mediastinal contours are within normal limits. No pneumothorax or pleural effusion is noted.  Mild interstitial densities are noted throughout both lungs concerning for possible pulmonary edema or scarring. Possible nodular density is seen in right upper lobe. The visualized skeletal structures are unremarkable.  IMPRESSION: Mild interstitial densities are noted throughout both lungs which may represent pulmonary edema or  possibly scarring. Small nodular density is noted in right upper lobe ; CT scan of the chest is recommended to evaluate for possible pulmonary nodule.   Electronically Signed   By: Marijo Conception, M.D.   On: 03/03/2015 09:34   Ct Chest W Contrast  03/03/2015   CLINICAL DATA:  Mid chest pain earlier today, now resolved. Abnormal chest radiograph with question of small right upper lobe pulmonary nodule. History of smoking.  EXAM: CT CHEST WITH CONTRAST  TECHNIQUE: Multidetector CT imaging of the chest was performed during intravenous contrast administration.  CONTRAST:  2m OMNIPAQUE IOHEXOL 300 MG/ML  SOLN  COMPARISON:  Chest radiograph-earlier same day  FINDINGS: There is a spiculated approximately 2.6 x 2.2 x 2.6 cm nodule within the medial aspect of the right lung apex (representative axial image 6, series 203, coronal image 69, series 204) which is worrisome for a primary bronchogenic carcinoma.  There are multiple additional scattered bilateral punctate pulmonary nodules, the largest of which within the subpleural aspect the right lower lobe measures 1.6 x 1.2 cm (is 43, series 203) with additional dominant nodule within the right lower lobe measuring 0.7 cm in diameter (is 41), dominant nodule within the subpleural aspect of the right upper lobe measuring 0.6 cm in diameter and dominant nodule within the left lower lobe measuring approximately 0.8 cm (image 37, series 203).  Borderline enlarged right suprahilar lymph node measures approximately 1.1 cm in greatest short axis diameter (image 29, series 201) and borderline enlarged high right pretracheal lymph node measures approximately 1.1 cm in diameter (image 15, series 201). Additional scattered shotty mediastinal lymph nodes are individually not enlarged by size criteria with index pretracheal lymph node measuring 0.6 cm (image 18) an index precarinal lymph node measuring 0.9 cm). No axillary lymphadenopathy.  Advanced predominantly centrilobular  emphysematous change. Minimal dependent subpleural ground-glass opacities, right greater than left, likely atelectasis or scar. No discrete focal airspace opacities. No air bronchograms. No pleural effusion or pneumothorax. The central pulmonary airways appear widely patent.  Normal heart size.  Coronary artery calcifications.  Although this examination was not tailored for the evaluation of the pulmonary arteries, there are no discrete filling defects within the central pulmonary arterial tree to suggest central pulmonary embolism.  Moderate to large amount of mixed calcified and noncalcified atherosclerotic plaque within a mildly tortuous but normal caliber thoracic aorta. No definite thoracic aortic dissection on this nongated examination. Conventional configuration of the aortic arch. There is a suspected hemodynamically significant narrowing involving the origin of the left common carotid artery (image 18, series 201).  Limited early arterial phase evaluation of the upper abdomen demonstrates a moderate to large amount of eccentric mixed calcified and noncalcified atherosclerotic plaque within in aneurysmal infrarenal abdominal aorta measuring at least 3.3 cm in maximal diameter (image 72, series 201).  No acute or aggressive osseous abnormalities.  Regional soft tissues appear normal. Normal appearance of the thyroid gland.  IMPRESSION: 1. Advanced centrilobular emphysematous change with an approximately 2.6 cm spiculated nodule within the medial aspect of the right lung apex worrisome for bronchogenic carcinoma. Borderline enlarged right suprahilar and high pretracheal lymph nodes, nonspecific though worrisome for metastatic disease. Further evaluation with PET imaging is recommended. 2.  Additional scattered bilateral indeterminate punctate pulmonary nodules, the largest of which within the right lower lobe measures 1.6 cm in diameter, nonspecific though several of which may be further evaluated at the time  of PET imaging. 3. Coronary artery calcifications. 4. Moderate to large amount of mixed calcified and noncalcified atherosclerotic plaque within a normal caliber thoracic aorta. Suspected hemodynamically significant narrowing involving the origin of the left common carotid artery. 5. Moderate to large amount of mixed calcified and noncalcified atherosclerotic plaque within a mildly aneurysmal abdominal aorta measuring approximately 3.3 cm in diameter, incompletely imaged. Further evaluation with nonemergent CTA of the abdomen and pelvis is recommended.   Electronically Signed   By: Sandi Mariscal M.D.   On: 03/03/2015 12:13    ASSESSMENT / PLAN:  RUL mass Multiple other scattered pulmonary nodules Tobacco abuse disorder ? New diagnosis COPD without evidence for acute exacerbation - CT chest reviewed by Dr Lamonte Sakai today > the R apical / medial mass is potentially reachable via navigational bronchoscopy. I have asked radiology to create a high res CT disc to use for the procedure. This would be done as an outpt. As long as his cardiac risk is addressed, I will be happy to arrange if IR doesn't believe he is a good target for needle bx.  - Will need PFT as outpatient -Stop smoking. -Await cards input > going for stress testing today 6/3   [facility] Minor ACNP Maryanna Shape PCCM Pager [phone] till 3 pm If no answer page [phone] 03/04/2015, 9:38 AM    Attending Note:  I have examined patient, reviewed labs, studies and notes. I have discussed the case with S Minor, and I agree with the data and plans as amended above. As above, I have reviewed the CT scan, believe the RUL mass could be approached bronchoscopically in OV with navigation. He is gone now for cardiac study. Will consider setting up ENB and biopsies after the cardiac stress testing. I have called my coordinator at the [facility] to arrange  Baltazar Apo, MD, PhD 03/04/2015, 2:30 PM [place] [facility] [phone] or if no answer  [phone]

## 2015-03-23 NOTE — Interval H&P Note (Signed)
PCCM Interval Note  Pt presents today for further eval of RUL mass. Since las encounter he has been re-hospitalized for A fib. He is on metoprolol, was started on Pradaxa that has been held for the last 5 days. He continues to smoke.   His CT chest was not compatible with the navigation software this am. I have discussed with Claiborne Billings in CT, but we were unable to fix the existing scan. Claiborne Billings is arranging to repeat his scan and burn another disc for him without charge. I have ordered the repeat scan.   He denies sx right now - he can usually feel when he is in A fib  Filed Vitals:   03/23/15 0635  BP: 147/71  Pulse: 74  Temp: 97.6 F (36.4 C)  TempSrc: Oral  Resp: 18  Weight: 63.322 kg (139 lb 9.6 oz)  SpO2: 99%   Gen: Pleasant, well-nourished, in no distress,  normal affect  ENT: No lesions,  mouth clear,  oropharynx clear, no postnasal drip  Neck: No JVD, no TMG, no carotid bruits  Lungs: No use of accessory muscles, clear without rales or rhonchi  Cardiovascular: RRR, heart sounds normal, no murmur or gallops, no peripheral edema  Musculoskeletal: No deformities, no cyanosis or clubbing  Neuro: alert, non focal  Skin: Warm, no lesions or rashes  Plans:  - will repeat his CT chest now to facilitate ENB - Pradaxa on hold, plan restart a few days after our procedure - will review results, hopefully Friday.   Baltazar Apo, MD, PhD 03/23/2015, 7:54 AM [place] [facility] [phone] or if no answer [phone]

## 2015-03-23 NOTE — Op Note (Signed)
Video Bronchoscopy with Electromagnetic Navigation Procedure Note  Date of Operation: 03/23/2015  Pre-op Diagnosis: right upper lobe mass  Post-op Diagnosis: same  Surgeon: Baltazar Apo  Assistants: none  Anesthesia: General endotracheal anesthesia  Operation: Flexible video fiberoptic bronchoscopy with electromagnetic navigation and biopsies.  Estimated Blood Loss: Minimal  Complications: none apparent  Indications and History: Mark Hurst is a 64 y.o. male with history of tobacco abuse. He was found on recent hospitalization for chest pain to have a right upper lobe mass and other scattered peripheral peribronchiovascular nodules. Recommendation was made to pursue tissue diagnosis via bronchoscopy. The risks, benefits, complications, treatment options and expected outcomes were discussed with the patient.  The possibilities of pneumothorax, pneumonia, reaction to medication, pulmonary aspiration, perforation of a viscus, bleeding, failure to diagnose a condition and creating a complication requiring transfusion or operation were discussed with the patient who freely signed the consent.    Description of Procedure: The patient was seen in the Preoperative Area, was examined and was deemed appropriate to proceed.  The patient was taken to OR 10, identified as Mark Hurst and the procedure verified as Flexible Video Fiberoptic Bronchoscopy.  A Time Out was held and the above information confirmed.   On the morning of his procedure a repeat high-resolution CT scan of the chest was performed. Utilizing Remy a virtual tracheobronchial tree was generated to allow the creation of distinct navigation pathways to the patient's right upper lobe parenchymal abnormality. After being taken to the operating room general anesthesia was initiated and the patient  was orally intubated. The video fiberoptic bronchoscope was introduced via the endotracheal tube and a general inspection  was performed which showed normal airways throughout. There were some white secretions emanating from most airways that were easily suctioned away. No endobronchial lesions were seen. The extendable working channel and locator guide were introduced into the bronchoscope. The distinct navigation pathways prepared prior to this procedure were then utilized to navigate to within 1.2cm of patient's RUL mass identified on CT scan. The extendable working channel was secured into place and the locator guide was withdrawn. Under fluoroscopic guidance transbronchial single and triple needle brushings, transbronchial Wang needle biopsies, and transbronchial forceps biopsies were performed to be sent for cytology and pathology. A bronchioalveolar lavage was performed in the right upper lobe and sent for cytology and microbiology (bacterial, fungal, AFB smears and cultures). At the end of the procedure a general airway inspection was performed and there was no evidence of active bleeding. The bronchoscope was removed.  The patient tolerated the procedure well. There was no significant blood loss and there were no obvious complications. A post-procedural chest x-ray is pending.  Samples: 1. Transbronchial needle brushings from right upper lobe mass 2. Transbronchial Wang needle biopsies from right upper lobe mass 3. Transbronchial forceps biopsies from right upper lobe mass 4. Bronchoalveolar lavage from right upper lobe  Plans:  The patient will be discharged from the PACU to home when recovered from anesthesia and after chest x-ray is reviewed. We will review the cytology, pathology and microbiology results with the patient when they become available. Outpatient followup will be with Dr Lamonte Sakai.    Baltazar Apo, MD, PhD 03/23/2015, 11:07 AM [place] [facility] [phone] or if no answer [phone]

## 2015-03-23 NOTE — Discharge Instructions (Signed)
Flexible Bronchoscopy, Care After °These instructions give you information on caring for yourself after your procedure. Your doctor may also give you more specific instructions. Call your doctor if you have any problems or questions after your procedure. °HOME CARE °· Do not eat or drink anything for 2 hours after your procedure. If you try to eat or drink before the medicine wears off, food or drink could go into your lungs. You could also burn yourself. °· After 2 hours have passed and when you can cough and gag normally, you may eat soft food and drink liquids slowly. °· The day after the test, you may eat your normal diet. °· You may do your normal activities. °· Keep all doctor visits. °GET HELP RIGHT AWAY IF: °· You get more and more short of breath. °· You get light-headed. °· You feel like you are going to pass out (faint). °· You have chest pain. °· You have new problems that worry you. °· You cough up more than a little blood. °· You cough up more blood than before. °MAKE SURE YOU: °· Understand these instructions. °· Will watch your condition. °· Will get help right away if you are not doing well or get worse. °Document Released: 07/15/2009 Document Revised: 09/22/2013 Document Reviewed: 05/22/2013 °Exit[employer]® Patient Information ©2015 [employer]. This information is not intended to replace advice given to you by your health care provider. Make sure you discuss any questions you have with your health care provider. ° °Please call our office for any questions or concerns. [phone].  ° ° ° °

## 2015-03-23 NOTE — Anesthesia Preprocedure Evaluation (Addendum)
Anesthesia Evaluation  Patient identified by MRN, date of birth, ID band Patient awake    Reviewed: Allergy & Precautions, NPO status , Patient's Chart, lab work & pertinent test results, reviewed documented beta blocker date and time   History of Anesthesia Complications Negative for: history of anesthetic complications  Airway Mallampati: II  TM Distance: >3 FB Neck ROM: Full    Dental  (+) Poor Dentition, Dental Advisory Given   Pulmonary COPDCurrent Smoker,  breath sounds clear to auscultation        Cardiovascular hypertension, Pt. on medications and Pt. on home beta blockers + angina + CAD and + Peripheral Vascular Disease Rhythm:Regular     Neuro/Psych    GI/Hepatic (+) Hepatitis -, A  Endo/Other  diabetes  Renal/GU      Musculoskeletal   Abdominal   Peds  Hematology   Anesthesia Other Findings   Reproductive/Obstetrics                           Anesthesia Physical Anesthesia Plan  ASA: III  Anesthesia Plan: General   Post-op Pain Management:    Induction: Intravenous  Airway Management Planned: Oral ETT  Additional Equipment:   Intra-op Plan:   Post-operative Plan: Extubation in OR  Informed Consent: I have reviewed the patients History and Physical, chart, labs and discussed the procedure including the risks, benefits and alternatives for the proposed anesthesia with the patient or authorized representative who has indicated his/her understanding and acceptance.   Dental advisory given  Plan Discussed with: CRNA, Anesthesiologist and Surgeon  Anesthesia Plan Comments:        Anesthesia Quick Evaluation

## 2015-03-23 NOTE — Anesthesia Postprocedure Evaluation (Signed)
  Anesthesia Post-op Note  Patient: Mark Hurst  Procedure(s) Performed: Procedure(s): VIDEO BRONCHOSCOPY WITH ENDOBRONCHIAL NAVIGATION (N/A)  Patient Location: PACU  Anesthesia Type:General  Level of Consciousness: awake, alert  and oriented  Airway and Oxygen Therapy: Patient Spontanous Breathing and Patient connected to nasal cannula oxygen  Post-op Pain: mild  Post-op Assessment: Post-op Vital signs reviewed, Patient's Cardiovascular Status Stable, Respiratory Function Stable, Patent Airway and Pain level controlled              Post-op Vital Signs: stable  Last Vitals:  Filed Vitals:   03/23/15 1308  BP:   Pulse: 73  Temp: 36.7 C  Resp: 13    Complications: No apparent anesthesia complications

## 2015-03-23 NOTE — Anesthesia Procedure Notes (Signed)
Procedure Name: Intubation Date/Time: 03/23/2015 9:13 AM Performed by: Susa Loffler Pre-anesthesia Checklist: Patient identified, Timeout performed, Emergency Drugs available, Suction available and Patient being monitored Patient Re-evaluated:Patient Re-evaluated prior to inductionOxygen Delivery Method: Circle system utilized Preoxygenation: Pre-oxygenation with 100% oxygen Intubation Type: IV induction Ventilation: Mask ventilation without difficulty Laryngoscope Size: Mac and 4 Grade View: Grade I Tube type: Oral Tube size: 8.5 mm Number of attempts: 1 Airway Equipment and Method: Stylet and LTA kit utilized Placement Confirmation: ETT inserted through vocal cords under direct vision,  positive ETCO2 and breath sounds checked- equal and bilateral Secured at: 23 cm Tube secured with: Tape Dental Injury: Teeth and Oropharynx as per pre-operative assessment

## 2015-03-24 ENCOUNTER — Encounter (HOSPITAL_COMMUNITY): Payer: Self-pay | Admitting: Emergency Medicine

## 2015-03-25 ENCOUNTER — Telehealth: Payer: Self-pay | Admitting: Emergency Medicine

## 2015-03-25 DIAGNOSIS — C3491 Malignant neoplasm of unspecified part of right bronchus or lung: Secondary | ICD-10-CM

## 2015-03-25 NOTE — Telephone Encounter (Signed)
Called pt to review pathology from his recent navigational bronchoscopy. No answer so left a message that I would try again to contact him. Path shows [place]. I have not referred him to [facility] yet since I haven't given the official dx yet.

## 2015-03-28 NOTE — Telephone Encounter (Signed)
I have spoken with the patient about his tissue dx, explained that he needs Oncology referral, PET scan, PFT. I will order these today.

## 2015-04-05 ENCOUNTER — Other Ambulatory Visit: Payer: Self-pay | Admitting: *Deleted

## 2015-04-05 ENCOUNTER — Telehealth: Payer: Self-pay | Admitting: *Deleted

## 2015-04-05 DIAGNOSIS — R918 Other nonspecific abnormal finding of lung field: Secondary | ICD-10-CM

## 2015-04-05 NOTE — Telephone Encounter (Signed)
Oncology Nurse Navigator Documentation  Oncology Nurse Navigator Flowsheets 04/05/2015  Referral date to RadOnc/MedOnc 03/31/2015  Navigator Encounter Type Introductory phone call/Called left vm message to call with appt for thoracic clinic on 04/14/15.  I left my name and phone number to call  Treatment Phase Abnormal Scans  Time Spent with Patient 15

## 2015-04-07 ENCOUNTER — Telehealth: Payer: Self-pay | Admitting: Internal Medicine

## 2015-04-07 ENCOUNTER — Ambulatory Visit (HOSPITAL_COMMUNITY)
Admission: RE | Admit: 2015-04-07 | Discharge: 2015-04-07 | Disposition: A | Discharge status: Home / Self Care | Payer: Medicare Other | Source: Ambulatory Visit | Attending: Emergency Medicine | Admitting: Emergency Medicine

## 2015-04-07 DIAGNOSIS — Z720 Tobacco use: Secondary | ICD-10-CM | POA: Diagnosis not present

## 2015-04-07 DIAGNOSIS — K759 Inflammatory liver disease, unspecified: Secondary | ICD-10-CM | POA: Diagnosis not present

## 2015-04-07 DIAGNOSIS — E119 Type 2 diabetes mellitus without complications: Secondary | ICD-10-CM | POA: Diagnosis not present

## 2015-04-07 DIAGNOSIS — C3491 Malignant neoplasm of unspecified part of right bronchus or lung: Secondary | ICD-10-CM | POA: Diagnosis not present

## 2015-04-07 LAB — GLUCOSE, CAPILLARY: GLUCOSE-CAPILLARY: 140 mg/dL — AB (ref 65–99)

## 2015-04-07 NOTE — Telephone Encounter (Signed)
new patient appt-s/w patient and gave np appt for 07/12 @ 3:15 w/Dr. Julien Nordmann

## 2015-04-07 NOTE — Telephone Encounter (Signed)
left message for patient and gave np appt for 07/08 @ 8:30 w/Dr. Julien Nordmann.  Contact information was left for patient to return call to confirm message was recevied

## 2015-04-08 ENCOUNTER — Other Ambulatory Visit: Payer: Medicare Other

## 2015-04-08 ENCOUNTER — Ambulatory Visit: Payer: Medicare Other | Admitting: Internal Medicine

## 2015-04-08 ENCOUNTER — Ambulatory Visit: Payer: Medicare Other

## 2015-04-12 ENCOUNTER — Telehealth: Payer: Self-pay | Admitting: Internal Medicine

## 2015-04-12 ENCOUNTER — Encounter: Payer: Self-pay | Admitting: Internal Medicine

## 2015-04-12 ENCOUNTER — Other Ambulatory Visit: Payer: Self-pay | Admitting: Internal Medicine

## 2015-04-12 ENCOUNTER — Ambulatory Visit: Payer: Medicare Other | Admitting: Internal Medicine

## 2015-04-12 ENCOUNTER — Other Ambulatory Visit: Payer: Medicare Other

## 2015-04-12 ENCOUNTER — Other Ambulatory Visit (HOSPITAL_BASED_OUTPATIENT_CLINIC_OR_DEPARTMENT_OTHER): Payer: Medicare Other

## 2015-04-12 ENCOUNTER — Ambulatory Visit: Payer: Medicare Other

## 2015-04-12 ENCOUNTER — Ambulatory Visit (HOSPITAL_BASED_OUTPATIENT_CLINIC_OR_DEPARTMENT_OTHER): Payer: Medicare Other | Admitting: Internal Medicine

## 2015-04-12 VITALS — BP 117/75 | HR 85 | Temp 98.0°F | Resp 18 | Ht 67.0 in | Wt 137.1 lb

## 2015-04-12 DIAGNOSIS — C3411 Malignant neoplasm of upper lobe, right bronchus or lung: Secondary | ICD-10-CM

## 2015-04-12 DIAGNOSIS — C3490 Malignant neoplasm of unspecified part of unspecified bronchus or lung: Secondary | ICD-10-CM | POA: Insufficient documentation

## 2015-04-12 DIAGNOSIS — C7951 Secondary malignant neoplasm of bone: Secondary | ICD-10-CM | POA: Diagnosis not present

## 2015-04-12 DIAGNOSIS — R918 Other nonspecific abnormal finding of lung field: Secondary | ICD-10-CM

## 2015-04-12 DIAGNOSIS — C3491 Malignant neoplasm of unspecified part of right bronchus or lung: Secondary | ICD-10-CM

## 2015-04-12 LAB — CBC WITH DIFFERENTIAL/PLATELET
BASO%: 1.3 % (ref 0.0–2.0)
BASOS ABS: 0.1 10*3/uL (ref 0.0–0.1)
EOS ABS: 0.2 10*3/uL (ref 0.0–0.5)
EOS%: 2.7 % (ref 0.0–7.0)
HCT: 38.9 % (ref 38.4–49.9)
HGB: 13 g/dL (ref 13.0–17.1)
LYMPH#: 1.3 10*3/uL (ref 0.9–3.3)
LYMPH%: 19.7 % (ref 14.0–49.0)
MCH: 30.3 pg (ref 27.2–33.4)
MCHC: 33.3 g/dL (ref 32.0–36.0)
MCV: 91 fL (ref 79.3–98.0)
MONO#: 0.6 10*3/uL (ref 0.1–0.9)
MONO%: 9.4 % (ref 0.0–14.0)
NEUT#: 4.6 10*3/uL (ref 1.5–6.5)
NEUT%: 66.9 % (ref 39.0–75.0)
PLATELETS: 263 10*3/uL (ref 140–400)
RBC: 4.28 10*6/uL (ref 4.20–5.82)
RDW: 13.3 % (ref 11.0–14.6)
WBC: 6.8 10*3/uL (ref 4.0–10.3)

## 2015-04-12 LAB — COMPREHENSIVE METABOLIC PANEL (CC13)
ALK PHOS: 95 U/L (ref 40–150)
ALT: 18 U/L (ref 0–55)
ANION GAP: 9 meq/L (ref 3–11)
AST: 17 U/L (ref 5–34)
Albumin: 3.6 g/dL (ref 3.5–5.0)
BUN: 12.6 mg/dL (ref 7.0–26.0)
CO2: 24 mEq/L (ref 22–29)
CREATININE: 1 mg/dL (ref 0.7–1.3)
Calcium: 10 mg/dL (ref 8.4–10.4)
Chloride: 95 mEq/L — ABNORMAL LOW (ref 98–109)
EGFR: 80 mL/min/{1.73_m2} — ABNORMAL LOW (ref >90)
Glucose: 152 mg/dl — ABNORMAL HIGH (ref 70–140)
Potassium: 5 mEq/L (ref 3.5–5.1)
SODIUM: 128 meq/L — AB (ref 136–145)
Total Bilirubin: 0.29 mg/dL (ref 0.20–1.20)
Total Protein: 7.2 g/dL (ref 6.4–8.3)

## 2015-04-12 NOTE — Telephone Encounter (Signed)
Gave and printed appt sched and avs fo rpt for July and Aug..left vm for Dr. Lawana Chambers office

## 2015-04-12 NOTE — Progress Notes (Signed)
[place] [phone]   [phone]  CONSULT NOTE  REFERRING PHYSICIAN: Dr. Baltazar Apo  REASON FOR CONSULTATION:  64 years old white male recently diagnosed with lung cancer.  HPI Mark Hurst is a 64 y.o. male with past medical history significant for multiple medical problems including history of diabetes mellitus, dyslipidemia, coronary artery disease, COPD, atrial fibrillation with rapid ventricular response and currently on Eliqus. The patient mentions that in early June 2016 he presented to the emergency department at [facility] complaining of chest pain. He has cardiac workup that showed atrial fibrillation with rapid ventricular response and the patient was started initially on Pradaxa which later was changed to [place]. During his evaluation chest x-ray was performed on 03/03/2015 and it showed small nodular density in the right upper lobe. This was followed by CT scan of the chest with contrast on 03/03/2015 and it showed a spiculated approximately 2.6 x 2.2 x 2.6 cm nodule within the medial aspect of the right lung apex which is worrisome for a primary bronchogenic carcinoma. There are multiple additional scattered bilateral punctate pulmonary nodules, the largest of which within the subpleural aspect the right lower lobe measures 1.6 x 1.2 cm with additional dominant nodule within the right lower lobe measuring 0.7 cm in diameter, dominant nodule within the subpleural aspect of the right upper lobe measuring 0.6 cm in diameter and dominant nodule within the left lower lobe measuring approximately 0.8 cm. Borderline enlarged right suprahilar lymph node measures approximately 1.1 cm in greatest short axis diameter and borderline enlarged high right pretracheal lymph node measures approximately 1.1 cm in diameter.  The patient was seen by Dr. Lamonte Sakai on an outpatient basis and on 03/23/2015 he underwent bronchoscopy with electromagnetic  navigational bronchoscopy. The final cytology from the transbronchial aspiration of the right upper lobe (Accession: [id number]) showed malignant cells consistent with non-small cell carcinoma favoring adenocarcinoma. Several other cytologic specimen were also positive for non-small cell lung cancer. There was insufficient material for the molecular studies. On 04/07/2015 the patient had a PET scan which showed right upper lobe primary bronchogenic carcinoma with cervical/thoracic nodal and osseous metastasis. Scattered hypermetabolic pulmonary nodules. These remain indeterminate and could represent foci of infection of and/or pulmonary metastasis. Consider antibiotic therapy and attention on follow-up. Right adrenal hypermetabolism is suspicious for metastatic disease. Hepatic flexure colonic hypermetabolism is without CT correlate. This could either be re-evaluated at followup or more entirely evaluated with colonoscopy to exclude colonic polyp or carcinoma. Dr. Lamonte Sakai kindly referred the patient to me today for further evaluation and recommendation regarding treatment of his condition. When seen today the patient is very anxious and he continues to complain of shortness of breath with dry cough with no hemoptysis. He denied having any significant chest pain. He has no significant weight loss or night sweats. He denied having any nausea or vomiting. No headache or visual changes. Family history significant for a mother with colon cancer, father had lung cancer and brother had esophageal and bladder cancer. The patient is single and has no children. His parents are still alive. He is currently on disability. He has a history of smoking 2 packs per day for around 47 years and unfortunately he continues to smoke. He also drinks 2 beer every day. No history of drug abuse.  HPI  Past Medical History  Diagnosis Date  . WPW (Wolff-Parkinson-White syndrome)     ablated  . DM (dermatomyositis)     x 10  years    . Hyperlipidemia     x 13 years  . Tobacco abuse   . CAD (coronary artery disease)     1997 LAD 95% stenosis. He had Rotablator of small  couple lesions in this astery. His last stress perfusion study was in 2001 with no evidence of ischemia.  . Iliac artery occlusion, right   . Hypertension     x 13 years  . Anginal pain   . Hepatitis A 1972    "in [employer]"  . Type II diabetes mellitus     Type 2    Past Surgical History  Procedure Laterality Date  . Small intestine surgery  ~ 1976    following ingestion of a fish bone  . Fracture surgery    . Colon surgery    . Hip fracture surgery Right 1976?    "have steel plate put in"  . Percutaneous coronary rotoblator intervention (pci-r)  ~ 1998  . Coronary angioplasty  ~ 1998  . Cataract extraction w/ intraocular lens  implant, bilateral Bilateral ~ 2002  . Radiofrequency ablation      for WPW  . Video bronchoscopy with endobronchial navigation N/A 03/23/2015    Procedure: VIDEO BRONCHOSCOPY WITH ENDOBRONCHIAL NAVIGATION;  Surgeon: Collene Gobble, MD;  Location: [facility] OR;  Service: Thoracic;  Laterality: N/A;    Family History  Problem Relation Age of Onset  . Diabetes Mother   . Depression Mother   . Coronary artery disease Father 1  . Lung cancer Father   . Cancer Father     Social History History  Substance Use Topics  . Smoking status: Current Every Day Smoker -- 2.00 packs/day for 47 years    Types: Cigarettes  . Smokeless tobacco: Never Used  . Alcohol Use: 2.4 oz/week    4 Cans of beer, 0 Standard drinks or equivalent per week     Comment: daily    Allergies  Allergen Reactions  . Codeine Itching    Current Outpatient Prescriptions  Medication Sig Dispense Refill  . aspirin 81 MG tablet Take 81 mg by mouth daily.      Marland Kitchen atorvastatin (LIPITOR) 80 MG tablet Take 40 mg by mouth daily at 6 PM.    . bisacodyl (DULCOLAX) 5 MG EC tablet Take 5 mg by mouth daily as needed for mild constipation or moderate  constipation.    . Cholecalciferol 2000 UNITS TABS Take 2,000 Units by mouth daily.    . dabigatran (PRADAXA) 150 MG CAPS capsule Take 1 capsule (150 mg total) by mouth 2 (two) times daily.    . Fish Oil OIL Take 1,000 mg by mouth 2 (two) times daily.     . insulin regular (NOVOLIN R,HUMULIN R) 100 units/mL injection Inject 20 Units into the skin every morning.    Marland Kitchen lisinopril (PRINIVIL,ZESTRIL) 40 MG tablet Take 20 mg daily. 30 tablet 6  . loratadine (CLARITIN) 10 MG tablet Take 10 mg by mouth daily.      . metFORMIN (GLUCOPHAGE) 500 MG tablet Take 1,000 mg by mouth 2 (two) times daily with a meal.     . metoprolol (LOPRESSOR) 50 MG tablet Take 25 mg by mouth daily.    . Multiple Vitamin (MULTIVITAMIN) tablet Take 1 tablet by mouth daily.     No current facility-administered medications for this visit.    Review of Systems  Constitutional: positive for fatigue Eyes: negative Ears, nose, mouth, throat, and face: negative Respiratory: positive for cough and dyspnea  on exertion Cardiovascular: negative Gastrointestinal: negative Genitourinary:negative Integument/breast: negative Hematologic/lymphatic: negative Musculoskeletal:negative Neurological: negative Behavioral/Psych: positive for anxiety Endocrine: negative Allergic/Immunologic: negative  Physical Exam  ZOX:WRUEA, healthy, no distress, well nourished, well developed and anxious SKIN: skin color, texture, turgor are normal, no rashes or significant lesions HEAD: Normocephalic, No masses, lesions, tenderness or abnormalities EYES: normal, PERRLA, Conjunctiva are pink and non-injected EARS: External ears normal, Canals clear OROPHARYNX:no exudate, no erythema and lips, buccal mucosa, and tongue normal  NECK: supple, no adenopathy, no JVD LYMPH:  no palpable lymphadenopathy, no hepatosplenomegaly LUNGS: clear to auscultation , and palpation HEART: regular rate & rhythm and no murmurs ABDOMEN:abdomen soft, non-tender,  normal bowel sounds and no masses or organomegaly BACK: Back symmetric, no curvature., No CVA tenderness EXTREMITIES:no joint deformities, effusion, or inflammation, no edema, no skin discoloration, no clubbing  NEURO: alert & oriented x 3 with fluent speech, no focal motor/sensory deficits  PERFORMANCE STATUS: ECOG 1  LABORATORY DATA: Lab Results  Component Value Date   WBC 6.8 04/12/2015   HGB 13.0 04/12/2015   HCT 38.9 04/12/2015   MCV 91.0 04/12/2015   PLT 263 04/12/2015      Chemistry      Component Value Date/Time   NA 128* 04/12/2015 1405   NA 126* 03/23/2015 0639   K 5.0 04/12/2015 1405   K 5.2* 03/23/2015 0639   CL 92* 03/23/2015 0639   CO2 24 04/12/2015 1405   CO2 25 03/23/2015 0639   BUN 12.6 04/12/2015 1405   BUN 12 03/23/2015 0639   CREATININE 1.0 04/12/2015 1405   CREATININE 0.87 03/23/2015 0639      Component Value Date/Time   CALCIUM 10.0 04/12/2015 1405   CALCIUM 9.4 03/23/2015 0639   ALKPHOS 95 04/12/2015 1405   ALKPHOS 79 03/16/2015 1504   AST 17 04/12/2015 1405   AST 23 03/16/2015 1504   ALT 18 04/12/2015 1405   ALT 20 03/16/2015 1504   BILITOT 0.29 04/12/2015 1405   BILITOT 0.4 03/16/2015 1504       RADIOGRAPHIC STUDIES: Dg Chest 2 View  03/16/2015   CLINICAL DATA:  Pre-admission for bronchoscopy  EXAM: CHEST  2 VIEW  COMPARISON:  03/07/2015  FINDINGS: Cardiomediastinal silhouette is stable. No acute infiltrate or pleural effusion. No pulmonary edema. Irregular nodule in right upper lobe medially again noted. Again noted bilateral emphysematous changes. Stable scarring in right middle lobe.  IMPRESSION: No active disease. Again noted irregular nodule in right upper lobe medially. Stable emphysematous changes and scarring in right middle lobe.   Electronically Signed   By: Lahoma Crocker M.D.   On: 03/16/2015 15:55   Ct Chest Wo Contrast  03/23/2015   CLINICAL DATA:  Right lung mass biopsy, preop.  EXAM: CT CHEST WITHOUT CONTRAST  TECHNIQUE:  Multidetector CT imaging of the chest was performed following the standard protocol without IV contrast.  COMPARISON:  03/03/2015.  FINDINGS: Mediastinum/Nodes: High right paratracheal lymph node measures 1.3 cm. Additional right paratracheal lymph nodes measure up to 9 mm in short axis. Hilar regions are difficult to definitively evaluate without IV contrast. No axillary adenopathy. Three-vessel coronary artery calcification. Heart size normal. No pericardial effusion.  Lungs/Pleura: Spiculated mass in the apical segment right upper lobe measures 2.8 x 3.1 cm with a long border of contact with the medial pleural margin. Moderate centrilobular emphysema. Scattered peribronchovascular nodular densities are seen bilaterally with nodular components in the right lower lobe, measuring up to 1.2 x 1.5 cm (series 4, image 46), as  on 03/03/2015. Basilar predominant subpleural reticulation and ground-glass with mild architectural distortion. Trace pleural fluid or thickening in the lower right hemi thorax. Airway is unremarkable.  Upper abdomen: Visualized portions of the liver, adrenal glands, spleen, pancreas and stomach are grossly unremarkable. Upper abdominal lymph nodes are not enlarged by CT size criteria.  Musculoskeletal: No worrisome lytic or sclerotic lesions. Degenerative changes are seen in the spine.  IMPRESSION: 1. Right upper lobe mass is highly worrisome for primary bronchogenic carcinoma. Pathologically enlarged high right paratracheal lymph node with additional borderline enlarged mediastinal lymph nodes. 2. Multiple scattered peripheral peribronchovascular nodules, the largest of which is seen in the right lower lobe, as on 03/03/2015. Bronchopneumonia could have this appearance. Metastatic disease is not excluded. 3. Basilar predominant pattern of pulmonary fibrosis. Differential diagnosis includes nonspecific interstitial pneumonitis and usual interstitial pneumonitis. 4. Three-vessel coronary artery  calcification.   Electronically Signed   By: Lorin Picket M.D.   On: 03/23/2015 09:32   Nm Pet Image Initial (pi) Skull Base To Thigh  04/07/2015   CLINICAL DATA:  Initial treatment strategy for non-small-cell lung cancer. Right-sided primary. New diagnosis. Hepatitis.  EXAM: NUCLEAR MEDICINE PET SKULL BASE TO THIGH  TECHNIQUE: 7.0 mCi F-18 FDG was injected intravenously. Full-ring PET imaging was performed from the skull base to thigh after the radiotracer. CT data was obtained and used for attenuation correction and anatomic localization.  FASTING BLOOD GLUCOSE:  Value: 140 mg/dl  COMPARISON:  Chest CTs of 03/23/2015 and 03/03/2015.  No prior PET.  FINDINGS: NECK  Low left jugular/ supraclavicular hypermetabolic node. This measures 8 mm and a S.U.V. max of 7.6 on image 47.  CHEST  Hypermetabolism corresponding to the previously described right apical lung mass. This measures 3.4 x 2.9 cm and a S.U.V. max of 14.5 on image 10 of series 8. This may have enlarged minimally since the prior CT.  Mediastinal and right hilar nodal metastasis. Right paratracheal node measures 1.4 cm and a S.U.V. max of 6.6 on image 55.  Right hilar node which measures a S.U.V. max of 6.5 on image 82.  Scattered hypermetabolic pulmonary nodules. An index medial right lower lobe pulmonary nodule measures 1.5 cm and a S.U.V. max of 5.1. Similar in size to on the prior exam.  ABDOMEN/PELVIS  Right adrenal hypermetabolism corresponding to mild thickening and nodularity. This measures a S.U.V. max of 5.4 on image 117.  A focus of hypermetabolism within the hepatic flexure colon corresponds to an area of underdistention. This measures a S.U.V. max of 10.5 on image 128.  SKELETON  Multifocal osseous metastasis. Index lesion within the L2 vertebral body measures a S.U.V. max of 7.7. There are also lesions within the thoracic spine, sternal body, and right acetabulum.  CT IMAGES PERFORMED FOR ATTENUATION CORRECTION  Bilateral carotid  atherosclerosis.  No cervical adenopathy.  Trace right pleural fluid is similar. Multivessel coronary artery atherosclerosis. Centrilobular emphysema. Basilar predominant interstitial lung disease. Mild right renal atrophy. Non aneurysmal infrarenal aortic dilatation at 3.0 cm. Extensive colonic diverticulosis. Right proximal femoral fixation. Right L5 pars defect.  IMPRESSION: 1. Right upper lobe primary bronchogenic carcinoma with cervical/thoracic nodal and osseous metastasis. 2. Scattered hypermetabolic pulmonary nodules. These remain indeterminate and could represent foci of infection of and/or pulmonary metastasis. Consider antibiotic therapy and attention on follow-up. 3. Right adrenal hypermetabolism is suspicious for metastatic disease. 4. Hepatic flexure colonic hypermetabolism is without CT correlate. This could either be re-evaluated at followup or more entirely evaluated with colonoscopy to exclude  colonic polyp or carcinoma. 5. Incidental findings, including atherosclerosis within the coronary arteries.   Electronically Signed   By: Abigail Miyamoto M.D.   On: 04/07/2015 11:50   Dg Chest Port 1 View  03/23/2015   CLINICAL DATA:  Post bronchoscopy and biopsy  EXAM: PORTABLE CHEST - 1 VIEW  COMPARISON:  03/16/2015  FINDINGS: Right upper lobe mass lesion appears larger. Some of this may be due to portable technique and some may be due to post biopsy hemorrhage.  Increase in right lower lobe infiltrate which may be due to post biopsy hemorrhage or pneumonia. Right lower lobe infiltrate was present on the recent chest CT of 03/23/2015. No effusion. Negative for pneumothorax  Left lung is clear  IMPRESSION: Negative for pneumothorax post biopsy of right upper lobe mass  Increased airspace disease right lower lobe may represent post biopsy hemorrhage or pneumonia.   Electronically Signed   By: Franchot Gallo M.D.   On: 03/23/2015 12:05   Dg C-arm Bronchoscopy  03/23/2015   CLINICAL DATA:    C-ARM  BRONCHOSCOPY  Fluoroscopy was utilized by the requesting physician.  No radiographic  interpretation.     ASSESSMENT: This is a very pleasant 64 years old white male recently diagnosed with a stage IV (T1b, N3, M1 B) non-small cell lung cancer favoring adenocarcinoma diagnosed in June 2016 presented with right upper lobe nodule in addition to multiple bilateral pulmonary nodules as well as mediastinal and supraclavicular lymphadenopathy and metastatic disease to the bones.   PLAN: I had a lengthy discussion with the patient today about his current disease stage, prognosis and treatment options. The patient was a little bit in a shock to hear this news about his diagnosis and the stage of the disease. I recommended for the patient to complete the staging workup by ordering a MRI of the brain to rule out brain metastasis. I discussed with the patient his treatment options including palliative care and hospice referral versus consideration of systemic chemotherapy with carboplatin for AUC of 5 and Alimta 500 MG/M2 every 3 weeks. The patient is interested in treatment. I discussed with him the adverse effect of this treatment including but not limited to alopecia, myelosuppression, nausea and vomiting, peripheral neuropathy, liver or renal dysfunction. He has a lot of dental issues and he would like to take care of it before starting his chemotherapy. I will refer him to Dr. Enrique Sack at the [facility] service for evaluation before starting treatment with chemotherapy or Xgeva for the metastatic bone disease. The biopsy has insufficient material for molecular testing. I recommended for the patient to send a blood sample to [facility] for second-generation sequencing and mutation testing.  For the questionable colon polyps, I referred the patient to gastroenterology for consideration of colonoscopy and evaluation of these lesions before starting the first cycle of his chemotherapy. I will see the  patient back for follow-up visit in 2 weeks for reevaluation and more detailed discussion of his treatment options based on the molecular studies as well as the staging workup. For smoke cessation, I strongly encouraged the patient to quit smoking and offered him a smoking cessation program. The patient was also seen by the thoracic navigator today for counseling. He was advised to call immediately if he has any concerning symptoms in the interval. The patient voices understanding of current disease status and treatment options and is in agreement with the current care plan.  All questions were answered. The patient knows to call the  clinic with any problems, questions or concerns. We can certainly see the patient much sooner if necessary.  Thank you so much for allowing me to participate in the care of Mark Hurst. I will continue to follow up the patient with you and assist in his care.  I spent 55 minutes counseling the patient face to face. The total time spent in the appointment was 80 minutes.  Disclaimer: This note was dictated with voice recognition software. Similar sounding words can inadvertently be transcribed and may not be corrected upon review.   Naevia Unterreiner K. April 12, 2015, 3:35 PM

## 2015-04-13 ENCOUNTER — Encounter (HOSPITAL_COMMUNITY): Payer: Medicare Other

## 2015-04-14 ENCOUNTER — Telehealth: Payer: Self-pay | Admitting: Medical Oncology

## 2015-04-14 ENCOUNTER — Ambulatory Visit: Payer: Medicare Other | Admitting: Physician Assistant

## 2015-04-14 ENCOUNTER — Telehealth: Payer: Self-pay | Admitting: Emergency Medicine

## 2015-04-14 NOTE — Telephone Encounter (Signed)
Per 03/25/15 phone note: Collene Gobble, MD at 03/28/2015 3:53 PM     Status: Signed       Expand All Collapse All   I have spoken with the patient about his tissue dx, explained that he needs Oncology referral, PET scan, PFT. I will order these today      --   [id number] x1 for pt

## 2015-04-14 NOTE — Telephone Encounter (Signed)
Pt returned call - [phone]

## 2015-04-14 NOTE — Telephone Encounter (Signed)
I left message for pt to contact Dr Lamonte Sakai regarding his questions about PFT.

## 2015-04-14 NOTE — Telephone Encounter (Signed)
Spoke with pt. Feels like he would not be able to perform the PFT to his full ability. Has been diagnosed with A Fib. Thinks that test would place stress on his heart. He would like to cancel this appointment. Pt will call respiratory and cancel this appointment. Nothing further was needed at this time.

## 2015-04-15 ENCOUNTER — Ambulatory Visit: Payer: Medicare Other | Admitting: Physician Assistant

## 2015-04-18 ENCOUNTER — Telehealth: Payer: Self-pay | Admitting: Medical Oncology

## 2015-04-18 ENCOUNTER — Other Ambulatory Visit (HOSPITAL_COMMUNITY): Payer: Medicare Other | Admitting: Dentistry

## 2015-04-18 ENCOUNTER — Encounter: Payer: Self-pay | Admitting: Physician Assistant

## 2015-04-18 ENCOUNTER — Ambulatory Visit (INDEPENDENT_AMBULATORY_CARE_PROVIDER_SITE_OTHER): Payer: Medicare Other | Admitting: Physician Assistant

## 2015-04-18 VITALS — BP 90/50 | HR 80 | Ht 67.0 in | Wt 137.0 lb

## 2015-04-18 DIAGNOSIS — R933 Abnormal findings on diagnostic imaging of other parts of digestive tract: Secondary | ICD-10-CM

## 2015-04-18 DIAGNOSIS — Z7901 Long term (current) use of anticoagulants: Secondary | ICD-10-CM | POA: Diagnosis not present

## 2015-04-18 DIAGNOSIS — I4891 Unspecified atrial fibrillation: Secondary | ICD-10-CM

## 2015-04-18 DIAGNOSIS — C3490 Malignant neoplasm of unspecified part of unspecified bronchus or lung: Secondary | ICD-10-CM

## 2015-04-18 NOTE — Patient Instructions (Signed)
You have been scheduled for a colonoscopy. Please follow written instructions given to you at your visit today.  Please pick up your prep supplies at the pharmacy within the next 1-3 days. If you use inhalers (even only as needed), please bring them with you on the day of your procedure.  After you contact your physician please call our office so we can give you further instructions on holding Eliquis.

## 2015-04-18 NOTE — Progress Notes (Signed)
Patient ID: Mark Hurst, male   DOB: 1950-10-24, 64 y.o.   MRN: [id number]    HPI:  Mark Hurst is a 64 y.o.   male  referred by Curt Bears, MD for evaluation of abnormal colon on hepatic flexure noted on MRI. Depaul has a past medical history of coronary artery disease and is status post PTCA in 1997. He has a history of type 2 diabetes, hyperlipidemia, hypertension, peripheral arterial disease and tobacco abuse. He was admitted to the hospital in June after experiencing chest pain. He had a nuclear stress test which was negative he was discharged home and then presented back to the hospital with chest pain and a sensation of palpitations. He was found to be in atrial fibrillation with rapid ventricular response. He did not have a previous history of atrial fibrillation but he had a history of [employer] for which she had ablation years ago. Recent stress test was negative for ischemia although that he was noted to have downsloping ST segment changes and T wave inversions as well as left ventricular hypertrophy. He was also found to have a 2.6 cm spiculated nodule in the right medial aspect of the right lung was found to have carcinoma. He has had bronchoscopy. Final cytology from transient bronchial aspiration of the right upper lobe showed malignant cells consistent with non-small cell carcinoma favoring adenocarcinoma. On July 17 had a PET scan which showed right upper lobe primary bronchogenic carcinoma with cervical and thoracic nodal and osseous metastasis he also had scattered hypermetabolic pulmonary nodules and was noted to have right adrenal hypermetabolism suspicious for metastatic disease as well as hepatic flexure colonic hypermetabolism. He is scheduled for an MRI of the brain tomorrow to rule out brain metastasis. He has been evaluated by Dr. Tedra Coupe of oncology and was advised to seek gastroenterology for colonoscopy for evaluation of the lesions noted at the hepatic flexure  prior to starting chemotherapy. When the patient was in the hospital he was started on pradaxa, however he says it bothered his stomach and so his primary care provider, Dr. Sharee Holster at the [facility] changed him to eliquis.Marland Kitchen He was instructed to use his eliquis twice daily, but he says when he did so he had nosebleeds and so he has been taking it only once a day. He has not notified Dr. Oletta Lamas of this. Calahan says he had a colonoscopy through the [place] at a clinic in [place] 5 years ago. He says it was normal and no polyps were noted and he was advised to have surveillance in 10 years. Since that time however his mother was recently diagnosed with colon cancer in her late 71s. Mark Hurst denies bright red blood per rectum or melena, but states over the past year his stool caliber has changed and his stools have been pencil thin. He will occasionally skip several days between bowel movements causing him to take a lax attentive and have loose stools.  Past Medical History  Diagnosis Date  . WPW (Wolff-Parkinson-White syndrome)     ablated  . DM (dermatomyositis)     x 10 years  . Hyperlipidemia     x 13 years  . Tobacco abuse   . CAD (coronary artery disease)     1997 LAD 95% stenosis. He had Rotablator of small  couple lesions in this astery. His last stress perfusion study was in 2001 with no evidence of ischemia.  . Iliac artery occlusion, right   . Hypertension  x 13 years  . Anginal pain   . Hepatitis A 1972    "in [employer]"  . Type II diabetes mellitus     Type 2  . Lung cancer     stage 4 - with mets    Past Surgical History  Procedure Laterality Date  . Small intestine surgery  ~ 1976    following ingestion of a fish bone  . Fracture surgery    . Colon surgery    . Hip fracture surgery Right 1976?    "have steel plate put in"  . Percutaneous coronary rotoblator intervention (pci-r)  ~ 1998  . Coronary angioplasty  ~ 1998  . Cataract extraction w/ intraocular lens   implant, bilateral Bilateral ~ 2002  . Radiofrequency ablation      for WPW  . Video bronchoscopy with endobronchial navigation N/A 03/23/2015    Procedure: VIDEO BRONCHOSCOPY WITH ENDOBRONCHIAL NAVIGATION;  Surgeon: Collene Gobble, MD;  Location: [facility] OR;  Service: Thoracic;  Laterality: N/A;   Family History  Problem Relation Age of Onset  . Diabetes Mother   . Depression Mother   . Coronary artery disease Father 87  . Lung cancer Father   . Cancer Father    History  Substance Use Topics  . Smoking status: Current Every Day Smoker -- 2.00 packs/day for 47 years    Types: Cigarettes  . Smokeless tobacco: Never Used  . Alcohol Use: 2.4 oz/week    4 Cans of beer, 0 Standard drinks or equivalent per week     Comment: daily   Current Outpatient Prescriptions  Medication Sig Dispense Refill  . Apixaban (ELIQUIS PO) Take 1 tablet by mouth daily.    Marland Kitchen aspirin 81 MG tablet Take 81 mg by mouth daily.      Marland Kitchen atorvastatin (LIPITOR) 80 MG tablet Take 40 mg by mouth daily at 6 PM.    . bisacodyl (DULCOLAX) 5 MG EC tablet Take 5 mg by mouth daily as needed for mild constipation or moderate constipation.    . Cholecalciferol 2000 UNITS TABS Take 2,000 Units by mouth daily.    . Fish Oil OIL Take 1,000 mg by mouth 2 (two) times daily.     . insulin regular (NOVOLIN R,HUMULIN R) 100 units/mL injection Inject 20 Units into the skin every morning.    Marland Kitchen lisinopril (PRINIVIL,ZESTRIL) 40 MG tablet Take 20 mg daily. 30 tablet 6  . loratadine (CLARITIN) 10 MG tablet Take 10 mg by mouth daily.      . metFORMIN (GLUCOPHAGE) 500 MG tablet Take 1,000 mg by mouth 2 (two) times daily with a meal.     . metoprolol (LOPRESSOR) 50 MG tablet Take 25 mg by mouth daily.    . Multiple Vitamin (MULTIVITAMIN) tablet Take 1 tablet by mouth daily.     No current facility-administered medications for this visit.   Allergies  Allergen Reactions  . Codeine Itching     Review of Systems: Gen: Denies any fever,  chills, sweats, and sleep disorder CV: Denies chest pain, angina,  syncope, orthopnea, PND, peripheral edema, and claudication. Resp: Denies dyspnea at rest, dyspnea with exercise, cough, sputum, wheezing, coughing up blood, and pleurisy. GI: Denies vomiting blood, jaundice, and fecal incontinence.   Denies dysphagia or odynophagia. GU : Denies urinary burning, blood in urine, urinary frequency, urinary hesitancy, nocturnal urination, and urinary incontinence. MS: Denies joint pain, limitation of movement, and swelling, stiffness, low back pain, extremity pain. Denies muscle weakness, cramps, atrophy.  Derm: Denies rash, itching, dry skin, hives, moles, warts, or unhealing ulcers.  Psych: Denies depression, anxiety, memory loss, suicidal ideation, hallucinations, paranoia, and confusion. Heme: Denies bruising, bleeding, and enlarged lymph nodes. Neuro:  Denies any headaches, dizziness, paresthesias. Endo:  Denies any problems with DM, thyroid, adrenal function  Studies: Ct Chest Wo Contrast  03/23/2015   CLINICAL DATA:  Right lung mass biopsy, preop.  EXAM: CT CHEST WITHOUT CONTRAST  TECHNIQUE: Multidetector CT imaging of the chest was performed following the standard protocol without IV contrast.  COMPARISON:  03/03/2015.  FINDINGS: Mediastinum/Nodes: High right paratracheal lymph node measures 1.3 cm. Additional right paratracheal lymph nodes measure up to 9 mm in short axis. Hilar regions are difficult to definitively evaluate without IV contrast. No axillary adenopathy. Three-vessel coronary artery calcification. Heart size normal. No pericardial effusion.  Lungs/Pleura: Spiculated mass in the apical segment right upper lobe measures 2.8 x 3.1 cm with a long border of contact with the medial pleural margin. Moderate centrilobular emphysema. Scattered peribronchovascular nodular densities are seen bilaterally with nodular components in the right lower lobe, measuring up to 1.2 x 1.5 cm (series 4,  image 46), as on 03/03/2015. Basilar predominant subpleural reticulation and ground-glass with mild architectural distortion. Trace pleural fluid or thickening in the lower right hemi thorax. Airway is unremarkable.  Upper abdomen: Visualized portions of the liver, adrenal glands, spleen, pancreas and stomach are grossly unremarkable. Upper abdominal lymph nodes are not enlarged by CT size criteria.  Musculoskeletal: No worrisome lytic or sclerotic lesions. Degenerative changes are seen in the spine.  IMPRESSION: 1. Right upper lobe mass is highly worrisome for primary bronchogenic carcinoma. Pathologically enlarged high right paratracheal lymph node with additional borderline enlarged mediastinal lymph nodes. 2. Multiple scattered peripheral peribronchovascular nodules, the largest of which is seen in the right lower lobe, as on 03/03/2015. Bronchopneumonia could have this appearance. Metastatic disease is not excluded. 3. Basilar predominant pattern of pulmonary fibrosis. Differential diagnosis includes nonspecific interstitial pneumonitis and usual interstitial pneumonitis. 4. Three-vessel coronary artery calcification.   Electronically Signed   By: Lorin Picket M.D.   On: 03/23/2015 09:32   Nm Pet Image Initial (pi) Skull Base To Thigh  04/07/2015   CLINICAL DATA:  Initial treatment strategy for non-small-cell lung cancer. Right-sided primary. New diagnosis. Hepatitis.  EXAM: NUCLEAR MEDICINE PET SKULL BASE TO THIGH  TECHNIQUE: 7.0 mCi F-18 FDG was injected intravenously. Full-ring PET imaging was performed from the skull base to thigh after the radiotracer. CT data was obtained and used for attenuation correction and anatomic localization.  FASTING BLOOD GLUCOSE:  Value: 140 mg/dl  COMPARISON:  Chest CTs of 03/23/2015 and 03/03/2015.  No prior PET.  FINDINGS: NECK  Low left jugular/ supraclavicular hypermetabolic node. This measures 8 mm and a S.U.V. max of 7.6 on image 47.  CHEST  Hypermetabolism  corresponding to the previously described right apical lung mass. This measures 3.4 x 2.9 cm and a S.U.V. max of 14.5 on image 10 of series 8. This may have enlarged minimally since the prior CT.  Mediastinal and right hilar nodal metastasis. Right paratracheal node measures 1.4 cm and a S.U.V. max of 6.6 on image 55.  Right hilar node which measures a S.U.V. max of 6.5 on image 82.  Scattered hypermetabolic pulmonary nodules. An index medial right lower lobe pulmonary nodule measures 1.5 cm and a S.U.V. max of 5.1. Similar in size to on the prior exam.  ABDOMEN/PELVIS  Right adrenal hypermetabolism corresponding to mild thickening  and nodularity. This measures a S.U.V. max of 5.4 on image 117.  A focus of hypermetabolism within the hepatic flexure colon corresponds to an area of underdistention. This measures a S.U.V. max of 10.5 on image 128.  SKELETON  Multifocal osseous metastasis. Index lesion within the L2 vertebral body measures a S.U.V. max of 7.7. There are also lesions within the thoracic spine, sternal body, and right acetabulum.  CT IMAGES PERFORMED FOR ATTENUATION CORRECTION  Bilateral carotid atherosclerosis.  No cervical adenopathy.  Trace right pleural fluid is similar. Multivessel coronary artery atherosclerosis. Centrilobular emphysema. Basilar predominant interstitial lung disease. Mild right renal atrophy. Non aneurysmal infrarenal aortic dilatation at 3.0 cm. Extensive colonic diverticulosis. Right proximal femoral fixation. Right L5 pars defect.  IMPRESSION: 1. Right upper lobe primary bronchogenic carcinoma with cervical/thoracic nodal and osseous metastasis. 2. Scattered hypermetabolic pulmonary nodules. These remain indeterminate and could represent foci of infection of and/or pulmonary metastasis. Consider antibiotic therapy and attention on follow-up. 3. Right adrenal hypermetabolism is suspicious for metastatic disease. 4. Hepatic flexure colonic hypermetabolism is without CT correlate.  This could either be re-evaluated at followup or more entirely evaluated with colonoscopy to exclude colonic polyp or carcinoma. 5. Incidental findings, including atherosclerosis within the coronary arteries.   Electronically Signed   By: Abigail Miyamoto M.D.   On: 04/07/2015 11:50   Dg Chest Port 1 View  03/23/2015   CLINICAL DATA:  Post bronchoscopy and biopsy  EXAM: PORTABLE CHEST - 1 VIEW  COMPARISON:  03/16/2015  FINDINGS: Right upper lobe mass lesion appears larger. Some of this may be due to portable technique and some may be due to post biopsy hemorrhage.  Increase in right lower lobe infiltrate which may be due to post biopsy hemorrhage or pneumonia. Right lower lobe infiltrate was present on the recent chest CT of 03/23/2015. No effusion. Negative for pneumothorax  Left lung is clear  IMPRESSION: Negative for pneumothorax post biopsy of right upper lobe mass  Increased airspace disease right lower lobe may represent post biopsy hemorrhage or pneumonia.   Electronically Signed   By: Franchot Gallo M.D.   On: 03/23/2015 12:05   Dg C-arm Bronchoscopy  03/23/2015   CLINICAL DATA:    C-ARM BRONCHOSCOPY  Fluoroscopy was utilized by the requesting physician.  No radiographic  interpretation.     LAB RESULTS: Comprehensive metabolic panel on 67/89/3810 had a total bili 0.29, alkaline phosphatase 95, AST 17, ALT 18. CBC 04/12/2015 white count 6.8, hemoglobin 13, hematocrit 38.9, platelets 263,000, MCV 91.  Physical Exam: BP 90/50 mmHg  Pulse 80  Ht '5\' 7"'$  (1.702 m)  Wt 137 lb (62.143 kg)  BMI 21.45 kg/m2 Constitutional: Pleasant, thin, pale, Caucasian male in no acute distress. HEENT: Normocephalic and atraumatic. Conjunctivae are normal. No scleral icterus. Neck supple. No thyromegaly Cardiovascular: Normal rate, regular rhythm.  Pulmonary/chest: Effort normal and breath sounds normal. No wheezing, rales or rhonchi. Abdominal: Soft, nondistended, nontender. Bowel sounds active throughout. There  are no masses palpable. No hepatomegaly. Extremities: no edema Lymphadenopathy: No cervical adenopathy noted. Neurological: Alert and oriented to person place and time. Skin: Skin is warm and dry. No rashes noted. Psychiatric: Normal mood and affect. Behavior is normal.  ASSESSMENT AND PLAN: 41.lorioff-year-old male recently diagnosed with non-small cell lung cancer favoring adeno carcinoma found to have an area of hypermetabolism in the colon at the hepatic flexure, referred for evaluation. Patient is to be scheduled for colonoscopy to evaluate for polyps or neoplasia.The risks, benefits, and alternatives to  colonoscopy with possible biopsy and possible polypectomy were discussed with the patient and they consent to proceed. Will hold eliquis 3 days prior to endoscopic procedures - will instruct when and how to resume after procedure. Benefits and risks of procedure explained including risks of bleeding, perforation, infection, missed lesions, reactions to medications and possible need for hospitalization and surgery for complications. Additional rare but real risk of stroke or other vascular clotting events off eliquis also explained and need to seek urgent help if any signs of these problems occur. Will communicate by phone or EMR with patient's  prescribing provider to confirm that holding eliquis is reasonable in this case.     Sophia Cubero, Deloris Ping 04/18/2015, 7:38 PM  CC: Curt Bears, MD

## 2015-04-18 NOTE — Telephone Encounter (Signed)
"  running into a snag with colonoscopy" . He had colonoscopy at [facility] and 'I don't want getting records from [place] to hold up my coloncoscopy". i told him to keep his appt with GI today.

## 2015-04-19 ENCOUNTER — Telehealth: Payer: Self-pay | Admitting: Physician Assistant

## 2015-04-19 ENCOUNTER — Telehealth: Payer: Self-pay

## 2015-04-19 ENCOUNTER — Telehealth: Payer: Self-pay | Admitting: Emergency Medicine

## 2015-04-19 LAB — FUNGUS CULTURE W SMEAR
FUNGAL SMEAR: NONE SEEN
FUNGAL SMEAR: NONE SEEN

## 2015-04-19 NOTE — Telephone Encounter (Signed)
Called and left Dr. Oletta Lamas a message in regards to holding Eliquis and getting previous colon reports and pathology.

## 2015-04-19 NOTE — Telephone Encounter (Signed)
Spoke with pt. He cancelled his APPT w/ RB on 04/27/15 and PFT cancelled as well. He reports he has been following Dr. Earlie Server and he has PET scan scheduled for 04/27/15. RB do you still need to see patient? thanks

## 2015-04-19 NOTE — Telephone Encounter (Signed)
04/19/2015   RE: Mark Hurst DOB: 11-Nov-1950 MRN: [id number]   Dear Dr. Felton Clinton,    We have scheduled the above patient for an endoscopic procedure. Our records show that he is on anticoagulation therapy.   Please advise as to how long the patient may come off his therapy of Eliquis prior to the procedure, which is scheduled for 04/26/2015.  Please fax back/ or route the completed form to [employer] at [phone].   Sincerely,    Margreta Journey   Faxed to [phone]. Dr Felton Clinton

## 2015-04-19 NOTE — Progress Notes (Signed)
I agree with the above note, plan 

## 2015-04-20 ENCOUNTER — Encounter (HOSPITAL_COMMUNITY): Payer: Self-pay | Admitting: Dentistry

## 2015-04-20 ENCOUNTER — Ambulatory Visit (HOSPITAL_COMMUNITY): Payer: Self-pay | Admitting: Dentistry

## 2015-04-20 VITALS — BP 154/56 | HR 76 | Temp 97.7°F

## 2015-04-20 DIAGNOSIS — C7951 Secondary malignant neoplasm of bone: Secondary | ICD-10-CM

## 2015-04-20 DIAGNOSIS — C3491 Malignant neoplasm of unspecified part of right bronchus or lung: Secondary | ICD-10-CM

## 2015-04-20 DIAGNOSIS — Z9189 Other specified personal risk factors, not elsewhere classified: Secondary | ICD-10-CM

## 2015-04-20 DIAGNOSIS — K0889 Other specified disorders of teeth and supporting structures: Secondary | ICD-10-CM

## 2015-04-20 DIAGNOSIS — M264 Malocclusion, unspecified: Secondary | ICD-10-CM

## 2015-04-20 DIAGNOSIS — K083 Retained dental root: Secondary | ICD-10-CM

## 2015-04-20 DIAGNOSIS — Z01818 Encounter for other preprocedural examination: Secondary | ICD-10-CM | POA: Diagnosis not present

## 2015-04-20 DIAGNOSIS — K08409 Partial loss of teeth, unspecified cause, unspecified class: Secondary | ICD-10-CM

## 2015-04-20 DIAGNOSIS — K030 Excessive attrition of teeth: Secondary | ICD-10-CM

## 2015-04-20 DIAGNOSIS — K045 Chronic apical periodontitis: Secondary | ICD-10-CM

## 2015-04-20 DIAGNOSIS — K029 Dental caries, unspecified: Secondary | ICD-10-CM

## 2015-04-20 DIAGNOSIS — K036 Deposits [accretions] on teeth: Secondary | ICD-10-CM

## 2015-04-20 DIAGNOSIS — K0530 Chronic periodontitis, unspecified: Secondary | ICD-10-CM

## 2015-04-20 DIAGNOSIS — IMO0002 Reserved for concepts with insufficient information to code with codable children: Secondary | ICD-10-CM

## 2015-04-20 NOTE — Telephone Encounter (Signed)
lmtcb X1 for pt  

## 2015-04-20 NOTE — Telephone Encounter (Signed)
Spoke to Dr. Oletta Lamas and faxed over release and anticoag letter.

## 2015-04-20 NOTE — Telephone Encounter (Signed)
Received fax from Dr. Felton Clinton stating patient can hold Eliquis 3 days before his procedure and he is to start taking it again the night of his procedure. Called patient and instructed him on holding the eliquis. Patient understands.

## 2015-04-20 NOTE — Telephone Encounter (Signed)
Pt returning call aND CAN BE REACHED @ [phone] PT SAID THAST HE IS VERY TIRED AND WOULD LIKE TO BE CALLED BACK IN THE MORNING.Mark Hurst

## 2015-04-20 NOTE — Patient Instructions (Signed)
Patient scheduled for operating procedure for Wednesday, 04/27/2015 7:30 AM at [facility]. Patient to stop Eliquis therapy on Saturday to allow for colonoscopy on Tuesday and dental extractions on Wednesday.  Dr. Enrique Sack

## 2015-04-20 NOTE — Telephone Encounter (Signed)
I reviewed his notes from Dr Julien Nordmann. I think it is [place] for him to cancel the OV with me for now, cancel the PFT. Let him know that Dr Julien Nordmann may want to refer him back to [place] at some point depending on how his breathing is doing to see if we can benefit him

## 2015-04-20 NOTE — Progress Notes (Signed)
DENTAL CONSULTATION  Date of Consultation:  04/20/2015 Patient Name:   Mark Hurst Date of Birth:   [birth year] Medical Record Number: [id number]  VITALS: BP 154/56 mmHg  Pulse 76  Temp(Src) 97.7 F (36.5 C) (Oral)  CHIEF COMPLAINT: Patient referred by Dr. Inda Merlin for a dental consultation.   HPI: Mark Hurst is a 64 year old male recently diagnosed with lung cancer with anticipated chemotherapy and Xgeva therapy. Patient now seen as part of a medically necessary prechemotherapy and pre-Xgeva therapy dental protocol examination to rule out dental infection that may affect the patient's systemic health and to prevent future complications such as osteonecrosis of the jaw related to anticipated Xgeva therapy.  The patient currently denies acute toothaches, swellings, or abscesses. Patient was last seen in November 2015 for extraction of 3 teeth. There were no complications. Prior to that the patient was seen for 2 extractions 2 years ago without complications. Both of these treatments were with Dr. Jeanann Lewandowsky.  Patient does not seek regular dental care due to economic problems. The patient denies having any partial dentures.  PROBLEM LIST: Patient Active Problem List   Diagnosis Date Noted  . Lung cancer 04/12/2015    Priority: High  . Chronic apical periodontitis 04/20/2015  . Atrial fibrillation with rapid ventricular response 03/07/2015  . PAD (peripheral artery disease) 03/04/2015  . Chest pain 03/03/2015  . Hyponatremia 03/03/2015  . CAD (coronary artery disease) 03/03/2015  . Lung mass 03/03/2015  . Solitary pulmonary nodule   . Hypoxemia   . Tobacco abuse   . Other emphysema   . Essential hypertension 06/02/2009  . Type 2 diabetes mellitus 03/30/2009  . Hyperlipidemia 03/30/2009  . Coronary atherosclerosis 03/30/2009  . WOLFF (WOLFE)-PARKINSON-WHITE (WPW) SYNDROME 03/30/2009    PMH: Past Medical History  Diagnosis Date  . WPW (Wolff-Parkinson-White syndrome)      ablated  . DM (dermatomyositis)     x 10 years  . Hyperlipidemia     x 13 years  . Tobacco abuse   . CAD (coronary artery disease)     1997 LAD 95% stenosis. He had Rotablator of small  couple lesions in this astery. His last stress perfusion study was in 2001 with no evidence of ischemia.  . Iliac artery occlusion, right   . Hypertension     x 13 years  . Anginal pain   . Hepatitis A 1972    "in [employer]"  . Type II diabetes mellitus     Type 2  . Lung cancer     stage 4 - with mets    PSH: Past Surgical History  Procedure Laterality Date  . Small intestine surgery  ~ 1976    following ingestion of a fish bone  . Fracture surgery    . Colon surgery    . Hip fracture surgery Right 1976?    "have steel plate put in"  . Percutaneous coronary rotoblator intervention (pci-r)  ~ 1998  . Coronary angioplasty  ~ 1998  . Cataract extraction w/ intraocular lens  implant, bilateral Bilateral ~ 2002  . Radiofrequency ablation      for WPW  . Video bronchoscopy with endobronchial navigation N/A 03/23/2015    Procedure: VIDEO BRONCHOSCOPY WITH ENDOBRONCHIAL NAVIGATION;  Surgeon: Collene Gobble, MD;  Location: [facility] OR;  Service: Thoracic;  Laterality: N/A;    ALLERGIES: Allergies  Allergen Reactions  . Codeine Itching    MEDICATIONS: Current Outpatient Prescriptions  Medication Sig Dispense Refill  . Apixaban (ELIQUIS PO) Take  1 tablet by mouth daily.    Marland Kitchen aspirin 81 MG tablet Take 81 mg by mouth daily.      Marland Kitchen atorvastatin (LIPITOR) 80 MG tablet Take 40 mg by mouth daily at 6 PM.    . bisacodyl (DULCOLAX) 5 MG EC tablet Take 5 mg by mouth daily as needed for mild constipation or moderate constipation.    . Cholecalciferol 2000 UNITS TABS Take 2,000 Units by mouth daily.    . Fish Oil OIL Take 1,000 mg by mouth 2 (two) times daily.     . insulin regular (NOVOLIN R,HUMULIN R) 100 units/mL injection Inject 20 Units into the skin every morning.    Marland Kitchen lisinopril (PRINIVIL,ZESTRIL) 40 MG  tablet Take 20 mg daily. 30 tablet 6  . loratadine (CLARITIN) 10 MG tablet Take 10 mg by mouth daily.      . metFORMIN (GLUCOPHAGE) 500 MG tablet Take 1,000 mg by mouth 2 (two) times daily with a meal.     . metoprolol (LOPRESSOR) 50 MG tablet Take 25 mg by mouth daily.    . Multiple Vitamin (MULTIVITAMIN) tablet Take 1 tablet by mouth daily.     No current facility-administered medications for this visit.    LABS: Lab Results  Component Value Date   WBC 6.8 04/12/2015   HGB 13.0 04/12/2015   HCT 38.9 04/12/2015   MCV 91.0 04/12/2015   PLT 263 04/12/2015      Component Value Date/Time   NA 128* 04/12/2015 1405   NA 126* 03/23/2015 0639   K 5.0 04/12/2015 1405   K 5.2* 03/23/2015 0639   CL 92* 03/23/2015 0639   CO2 24 04/12/2015 1405   CO2 25 03/23/2015 0639   GLUCOSE 152* 04/12/2015 1405   GLUCOSE 112* 03/23/2015 0639   BUN 12.6 04/12/2015 1405   BUN 12 03/23/2015 0639   CREATININE 1.0 04/12/2015 1405   CREATININE 0.87 03/23/2015 0639   CALCIUM 10.0 04/12/2015 1405   CALCIUM 9.4 03/23/2015 0639   GFRNONAA >60 03/23/2015 0639   GFRAA >60 03/23/2015 0639   Lab Results  Component Value Date   INR 0.96 03/23/2015   INR 1.58* 03/16/2015   INR 1.03 03/03/2015   No results found for: PTT  SOCIAL HISTORY: History   Social History  . Marital Status: Single    Spouse Name: N/A  . Number of Children: 0  . Years of Education: N/A   Occupational History  . Not on file.   Social History Main Topics  . Smoking status: Current Every Day Smoker -- 2.00 packs/day for 47 years    Types: Cigarettes  . Smokeless tobacco: Never Used  . Alcohol Use: 2.4 oz/week    4 Cans of beer, 0 Standard drinks or equivalent per week     Comment: daily  . Drug Use: No  . Sexual Activity: No   Other Topics Concern  . Not on file   Social History Narrative   The patient has a roommate who helps him with shopping and around the house. Dekari says he doesn't know what he would do without  his friend/roommate.    FAMILY HISTORY: Family History  Problem Relation Age of Onset  . Diabetes Mother   . Depression Mother   . Coronary artery disease Father 65  . Lung cancer Father   . Cancer Father     REVIEW OF SYSTEMS: Reviewed with the patient and his included in dental record.  DENTAL HISTORY: CHIEF COMPLAINT: Patient referred by Dr.  Mohammad for a dental consultation.   HPI: Mark Hurst is a 64 year old male recently diagnosed with lung cancer with anticipated chemotherapy and Xgeva therapy. Patient now seen as part of a medically necessary prechemotherapy and pre-Xgeva therapy dental protocol examination to rule out dental infection that may affect the patient's systemic health and to prevent future complications such as osteonecrosis of the jaw related to anticipated Xgeva therapy.  The patient currently denies acute toothaches, swellings, or abscesses. Patient was last seen in November 2015 for extraction of 3 teeth. There were no complications. Prior to that the patient was seen for 2 extractions 2 years ago without complications. Both of these treatments were with Dr. Jeanann Lewandowsky.  Patient does not seek regular dental care due to economic problems. The patient denies having any partial dentures.  DENTAL EXAMINATION: GENERAL: The patient is a well-developed, well-nourished male in no acute distress. HEAD AND NECK: There is no palpable submandibular lymphadenopathy. The patient denies acute TMJ symptoms. INTRAORAL EXAM: Patient has xerostomia. There is no evidence of oral abscess formation. There is atrophy of the edentulous alveolar ridges. DENTITION: Patient with multiple missing teeth numbers 1, 3, 4, 9, 10, 13, 14, 15, 16, 18, 19, 28, 30, 31, and 32. There are retained root tips in the area of tooth numbers 2 and possibly #12. PERIODONTAL: Patient has chronic periodontitis with plaque and calculus accumulations, generalized gingival recession, and tooth  mobility. DENTAL CARIES/SUBOPTIMAL RESTORATIONS: Multiple dental caries and significant occlusal and incisal attrition are noted. ENDODONTIC: Patient currently denies acute pulpitis symptoms. Patient does have multiple areas of periapical pathology and radiolucency. CROWN AND BRIDGE: There are no crown or bridge restorations noted. PROSTHODONTIC: Patient denies having any partial dentures. OCCLUSION: He has a poor occlusal scheme secondary to multiple missing teeth, retained root tips, supra-eruption and drifting of the unopposed teeth into the edentulous areas, and lack of replacement of missing teeth dental prostheses.  RADIOGRAPHIC INTERPRETATION: An orthopantogram was taken and supplemented with 12 periapical radiographs. There are multiple missing teeth. There are retained root tips. There multiple retained root segments. There is evidence of incisal and occlusal attrition. There is periapical pathology and radiolucency noted. There is supra-eruption and drifting of the unopposed teeth into the edentulous areas.  ASSESSMENTS: 1. Lung cancer with bony metastases 2. Anticipated chemotherapy 3. Anticipated Xgeva therapy 4. Prechemotherapy and Xgeva therapy dental protocol 5. Chronic apical periodontitis 6. Retained root segments 7. Dental caries 8. Gingival recession 9. Chronic periodontitis with bone loss 10. Tooth mobility 11. Accretions 12. Significant occlusal and incisal attrition 13. Poor occlusal scheme and malocclusion 14. No history of partial dentures 15. Eliquis therapy with risk for bleeding with invasive dental procedures  PLAN/RECOMMENDATIONS: 1. I discussed the risks, benefits, and complications of various treatment options with the patient in relationship to the medical and dental conditions, anticipated Xgeva therapy, anticipated chemotherapy, and current anticoagulant therapy. We discussed various treatment options to include no treatment, multiple extractions with  alveoloplasty, pre-prosthetic surgery as indicated, periodontal therapy, dental restorations, root canal therapy, crown and bridge therapy, implant therapy, and replacement of missing teeth as indicated. The patient currently wishes to proceed with extraction remaining teeth with alveoloplasty and pre-prosthetic surgery as indicated in the operating room and general anesthesia. This has been scheduled for Wednesday, 04/27/2015 at 7:30 AM at [facility].  The patient is scheduled to be off Eliquis therapy starting Saturday to allow for colonoscopy procedure on Tuesday morning. Patient will continue to have his Eliquis therapy discontinued to  allow for dental extractions the morning of Wednesday, 04/27/2015 at 7:30 AM. The Eliquis therapy will then be restarted 1-2 days later. The patient will then follow the dentist of his choice for fabrication of upper lower complete dentures after adequate healing.   2. Discussion of findings with medical team and coordination of future medical and dental care as needed.  I spent in excess of  120 minutes during the conduct of this consultation and >50% of this time involved direct face-to-face encounter for counseling and/or coordination of the patient's care.    Lenn Cal, DDS

## 2015-04-21 ENCOUNTER — Other Ambulatory Visit (HOSPITAL_COMMUNITY): Payer: Self-pay | Admitting: Dentistry

## 2015-04-21 NOTE — Telephone Encounter (Signed)
Spoke with pt, he is aware of recs.  Nothing further needed.

## 2015-04-22 ENCOUNTER — Encounter: Payer: Self-pay | Admitting: *Deleted

## 2015-04-22 ENCOUNTER — Encounter (HOSPITAL_COMMUNITY): Payer: Medicare Other

## 2015-04-22 ENCOUNTER — Encounter (HOSPITAL_COMMUNITY): Payer: Self-pay | Admitting: *Deleted

## 2015-04-22 ENCOUNTER — Telehealth (HOSPITAL_COMMUNITY): Payer: Self-pay | Admitting: Radiology

## 2015-04-22 NOTE — Progress Notes (Signed)
Oncology Nurse Navigator Documentation  Oncology Nurse Navigator Flowsheets 04/22/2015  Navigator Encounter Type Other/Rep from [facility] called and stated results will be postponed out until Aug 4th.  I will update Dr. Julien Nordmann.   Interventions Coordination of Care  Time Spent with Patient 15

## 2015-04-25 ENCOUNTER — Telehealth (HOSPITAL_COMMUNITY): Payer: Self-pay | Admitting: Dentistry

## 2015-04-25 ENCOUNTER — Telehealth: Payer: Self-pay | Admitting: Gastroenterology

## 2015-04-25 ENCOUNTER — Telehealth: Payer: Self-pay | Admitting: *Deleted

## 2015-04-25 ENCOUNTER — Ambulatory Visit (HOSPITAL_COMMUNITY): Payer: Medicare Other

## 2015-04-25 DIAGNOSIS — M546 Pain in thoracic spine: Secondary | ICD-10-CM

## 2015-04-25 MED ORDER — OXYCODONE-ACETAMINOPHEN 5-325 MG PO TABS: 1.0000 | ORAL_TABLET | Freq: Four times a day (QID) | ORAL | Status: DC | PRN

## 2015-04-25 NOTE — Telephone Encounter (Signed)
04/25/2015  Patient:            Mark Hurst Date of Birth:  1951/08/26 MRN:                [id number]   I called the patient concerning recent cancellation of colonoscopy procedure with Dr. Ardis Hughs and to determine whether he wished to cancel dental procedures as well. Patient indicated that he had a fever and significant back pain. Patient wished to cancel dental operating room procedures as well. Patient to contact Dr. Eugenia Pancoast office to reschedule colonoscopy procedure and we will attempt to coordinate the dental operating room procedure with discontinued Eliquis therapy for the colonoscopy procedure.  Lenn Cal, DDS CC: Dr. Ardis Hughs, Dr. Inda Merlin

## 2015-04-25 NOTE — Telephone Encounter (Signed)
Please ask that he reschedule it ASAP

## 2015-04-25 NOTE — Telephone Encounter (Signed)
Rx locked up for pick up in am. Pt notified.

## 2015-04-25 NOTE — Telephone Encounter (Signed)
No, thanks

## 2015-04-25 NOTE — Telephone Encounter (Signed)
ON 04/20/15 PT. WAS SITTING IN THE DENTAL CHAIR AT DR.KULINSKI'S OFFICE WHEN HE BEGAN TO HAVE CONSTANT THROBBING LOWER BACK PAIN AT A SCALE OF EIGHT. DR.KULINSKI INSTRUCTED PT. TO TAKE IBUPROFEN '800MG'$  EVERY SIX HOURS. HE STARTED THIS MEDICATION TODAY FOR THE FIRST TIME AND HIS PAIN IS AT A ONE BUT HE HAS NOT GOTTEN OUT OF BED. PT. ALSO SAID HE HAD A FEVER THIS MORNING BECAUSE HE "FELT HOT" BUT HE DID NOT TAKE HIS TEMPERATURE BECAUSE HE DOES NOT HAVE A THERMOMETER. SINCE PT. WAS FEELING SO BAD. HE HAS CANCELLED HIS MRI FOR TODAY, HIS COLONOSCOPY FOR 04/26/15, AND HIS DENTAL SURGERY TO REMOVE 18 TEETH ON 04/27/15. PT. WOULD LIKE SOMETHING FOR HIS BACK PAIN SO HE CAN RESCHEDULE HIS APPOINTMENTS.

## 2015-04-25 NOTE — Telephone Encounter (Signed)
He has canceled his referral appts due to pain. "Out of desperation i started taking ibuprofen . I am off of Eloquis for a few days.It did help and reduce my fever. His pharmacist recommended 4,  200 mg  Tablets. I slept and felt much better when I woke up. I am using a weight lifters belt to support my back"

## 2015-04-26 ENCOUNTER — Encounter: Payer: Medicare Other | Admitting: Gastroenterology

## 2015-04-27 ENCOUNTER — Ambulatory Visit: Payer: Medicare Other | Admitting: Emergency Medicine

## 2015-04-27 ENCOUNTER — Encounter: Payer: Medicare Other | Admitting: Internal Medicine

## 2015-04-27 ENCOUNTER — Ambulatory Visit (HOSPITAL_COMMUNITY): Payer: Medicare Other

## 2015-04-27 ENCOUNTER — Ambulatory Visit (HOSPITAL_COMMUNITY): Admission: RE | Admit: 2015-04-27 | Payer: Medicare Other | Source: Ambulatory Visit | Admitting: Dentistry

## 2015-04-27 ENCOUNTER — Encounter (HOSPITAL_COMMUNITY): Admission: RE | Payer: Self-pay | Source: Ambulatory Visit

## 2015-04-27 SURGERY — MULTIPLE EXTRACTION WITH ALVEOLOPLASTY
Anesthesia: General

## 2015-05-02 ENCOUNTER — Other Ambulatory Visit (HOSPITAL_BASED_OUTPATIENT_CLINIC_OR_DEPARTMENT_OTHER): Payer: Medicare PPO

## 2015-05-02 ENCOUNTER — Telehealth: Payer: Self-pay | Admitting: Internal Medicine

## 2015-05-02 ENCOUNTER — Ambulatory Visit (HOSPITAL_BASED_OUTPATIENT_CLINIC_OR_DEPARTMENT_OTHER): Payer: Medicare Other | Admitting: Internal Medicine

## 2015-05-02 ENCOUNTER — Encounter: Payer: Self-pay | Admitting: Internal Medicine

## 2015-05-02 VITALS — BP 159/65 | HR 78 | Temp 98.2°F | Resp 19 | Ht 67.0 in | Wt 135.0 lb

## 2015-05-02 DIAGNOSIS — K047 Periapical abscess without sinus: Secondary | ICD-10-CM | POA: Insufficient documentation

## 2015-05-02 DIAGNOSIS — M549 Dorsalgia, unspecified: Secondary | ICD-10-CM | POA: Insufficient documentation

## 2015-05-02 DIAGNOSIS — C7951 Secondary malignant neoplasm of bone: Secondary | ICD-10-CM

## 2015-05-02 DIAGNOSIS — C3411 Malignant neoplasm of upper lobe, right bronchus or lung: Secondary | ICD-10-CM | POA: Diagnosis not present

## 2015-05-02 DIAGNOSIS — K635 Polyp of colon: Secondary | ICD-10-CM

## 2015-05-02 DIAGNOSIS — C3491 Malignant neoplasm of unspecified part of right bronchus or lung: Secondary | ICD-10-CM

## 2015-05-02 LAB — COMPREHENSIVE METABOLIC PANEL (CC13)
ALT: 18 U/L (ref 0–55)
AST: 18 U/L (ref 5–34)
Albumin: 3.4 g/dL — ABNORMAL LOW (ref 3.5–5.0)
Alkaline Phosphatase: 95 U/L (ref 40–150)
Anion Gap: 5 mEq/L (ref 3–11)
BILIRUBIN TOTAL: 0.26 mg/dL (ref 0.20–1.20)
BUN: 9.5 mg/dL (ref 7.0–26.0)
CALCIUM: 9.7 mg/dL (ref 8.4–10.4)
CHLORIDE: 93 meq/L — AB (ref 98–109)
CO2: 28 mEq/L (ref 22–29)
Creatinine: 0.8 mg/dL (ref 0.7–1.3)
EGFR: >90 mL/min/{1.73_m2} (ref >90)
GLUCOSE: 106 mg/dL (ref 70–140)
Potassium: 5.3 mEq/L — ABNORMAL HIGH (ref 3.5–5.1)
SODIUM: 125 meq/L — AB (ref 136–145)
Total Protein: 7 g/dL (ref 6.4–8.3)

## 2015-05-02 LAB — CBC WITH DIFFERENTIAL/PLATELET
BASO%: 1.1 % (ref 0.0–2.0)
Basophils Absolute: 0.1 10*3/uL (ref 0.0–0.1)
EOS%: 1.7 % (ref 0.0–7.0)
Eosinophils Absolute: 0.1 10*3/uL (ref 0.0–0.5)
HEMATOCRIT: 39 % (ref 38.4–49.9)
HEMOGLOBIN: 13.2 g/dL (ref 13.0–17.1)
LYMPH#: 0.9 10*3/uL (ref 0.9–3.3)
LYMPH%: 13.8 % — ABNORMAL LOW (ref 14.0–49.0)
MCH: 30.3 pg (ref 27.2–33.4)
MCHC: 33.9 g/dL (ref 32.0–36.0)
MCV: 89.6 fL (ref 79.3–98.0)
MONO#: 0.6 10*3/uL (ref 0.1–0.9)
MONO%: 8.7 % (ref 0.0–14.0)
NEUT#: 4.9 10*3/uL (ref 1.5–6.5)
NEUT%: 74.7 % (ref 39.0–75.0)
PLATELETS: 220 10*3/uL (ref 140–400)
RBC: 4.36 10*6/uL (ref 4.20–5.82)
RDW: 13.4 % (ref 11.0–14.6)
WBC: 6.5 10*3/uL (ref 4.0–10.3)

## 2015-05-02 MED ORDER — TRAMADOL HCL 50 MG PO TABS: 50.0000 mg | ORAL_TABLET | Freq: Four times a day (QID) | ORAL | Status: DC | PRN

## 2015-05-02 MED ORDER — LORAZEPAM 1 MG PO TABS: 1.0000 mg | ORAL_TABLET | Freq: Once | ORAL | Status: DC

## 2015-05-02 NOTE — Telephone Encounter (Signed)
Pt confirmed labs/ov per 08/01 per [facility], gave pt avs and calendar... KJ, r/s MRI pt confirmed and gave pt information concerning medication not to be taken until he arrives at the hospital, also advised Santiago Glad will call to set up consult for radiation, sent msg to Santiago Glad to schedule pt.... Gave pt information on Dr. Enrique Sack to call back to r/s his OP for surgery

## 2015-05-02 NOTE — Patient Instructions (Signed)
Smoking Cessation Quitting smoking is important to your health and has many advantages. However, it is not always easy to quit since nicotine is a very addictive drug. Oftentimes, people try 3 times or more before being able to quit. This document explains the best ways for you to prepare to quit smoking. Quitting takes hard work and a lot of effort, but you can do it. ADVANTAGES OF QUITTING SMOKING  You will live longer, feel better, and live better.  Your body will feel the impact of quitting smoking almost immediately.  Within 20 minutes, blood pressure decreases. Your pulse returns to its normal level.  After 8 hours, carbon monoxide levels in the blood return to normal. Your oxygen level increases.  After 24 hours, the chance of having a heart attack starts to decrease. Your breath, hair, and body stop smelling like smoke.  After 48 hours, damaged nerve endings begin to recover. Your sense of taste and smell improve.  After 72 hours, the body is virtually free of nicotine. Your bronchial tubes relax and breathing becomes easier.  After 2 to 12 weeks, lungs can hold more air. Exercise becomes easier and circulation improves.  The risk of having a heart attack, stroke, cancer, or lung disease is greatly reduced.  After 1 year, the risk of coronary heart disease is cut in half.  After 5 years, the risk of stroke falls to the same as a nonsmoker.  After 10 years, the risk of lung cancer is cut in half and the risk of other cancers decreases significantly.  After 15 years, the risk of coronary heart disease drops, usually to the level of a nonsmoker.  If you are pregnant, quitting smoking will improve your chances of having a healthy baby.  The people you live with, especially any children, will be healthier.  You will have extra money to spend on things other than cigarettes. QUESTIONS TO THINK ABOUT BEFORE ATTEMPTING TO QUIT You may want to talk about your answers with your  health care provider.  Why do you want to quit?  If you tried to quit in the past, what helped and what did not?  What will be the most difficult situations for you after you quit? How will you plan to handle them?  Who can help you through the tough times? Your family? Friends? A health care provider?  What pleasures do you get from smoking? What ways can you still get pleasure if you quit? Here are some questions to ask your health care provider:  How can you help me to be successful at quitting?  What medicine do you think would be best for me and how should I take it?  What should I do if I need more help?  What is smoking withdrawal like? How can I get information on withdrawal? GET READY  Set a quit date.  Change your environment by getting rid of all cigarettes, ashtrays, matches, and lighters in your home, car, or work. Do not let people smoke in your home.  Review your past attempts to quit. Think about what worked and what did not. GET SUPPORT AND ENCOURAGEMENT You have a better chance of being successful if you have help. You can get support in many ways.  Tell your family, friends, and coworkers that you are going to quit and need their support. Ask them not to smoke around you.  Get individual, group, or telephone counseling and support. Programs are available at local hospitals and health centers. Call   your local health department for information about programs in your area.  Spiritual beliefs and practices may help some smokers quit.  Download a "quit meter" on your computer to keep track of quit statistics, such as how long you have gone without smoking, cigarettes not smoked, and money saved.  Get a self-help book about quitting smoking and staying off tobacco. LEARN NEW SKILLS AND BEHAVIORS  Distract yourself from urges to smoke. Talk to someone, go for a walk, or occupy your time with a task.  Change your normal routine. Take a different route to work.  Drink tea instead of coffee. Eat breakfast in a different place.  Reduce your stress. Take a hot bath, exercise, or read a book.  Plan something enjoyable to do every day. Reward yourself for not smoking.  Explore interactive web-based programs that specialize in helping you quit. GET MEDICINE AND USE IT CORRECTLY Medicines can help you stop smoking and decrease the urge to smoke. Combining medicine with the above behavioral methods and support can greatly increase your chances of successfully quitting smoking.  Nicotine replacement therapy helps deliver nicotine to your body without the negative effects and risks of smoking. Nicotine replacement therapy includes nicotine gum, lozenges, inhalers, nasal sprays, and skin patches. Some may be available over-the-counter and others require a prescription.  Antidepressant medicine helps people abstain from smoking, but how this works is unknown. This medicine is available by prescription.  Nicotinic receptor partial agonist medicine simulates the effect of nicotine in your brain. This medicine is available by prescription. Ask your health care provider for advice about which medicines to use and how to use them based on your health history. Your health care provider will tell you what side effects to look out for if you choose to be on a medicine or therapy. Carefully read the information on the package. Do not use any other product containing nicotine while using a nicotine replacement product.  RELAPSE OR DIFFICULT SITUATIONS Most relapses occur within the first 3 months after quitting. Do not be discouraged if you start smoking again. Remember, most people try several times before finally quitting. You may have symptoms of withdrawal because your body is used to nicotine. You may crave cigarettes, be irritable, feel very hungry, cough often, get headaches, or have difficulty concentrating. The withdrawal symptoms are only temporary. They are strongest  when you first quit, but they will go away within 10-14 days. To reduce the chances of relapse, try to:  Avoid drinking alcohol. Drinking lowers your chances of successfully quitting.  Reduce the amount of caffeine you consume. Once you quit smoking, the amount of caffeine in your body increases and can give you symptoms, such as a rapid heartbeat, sweating, and anxiety.  Avoid smokers because they can make you want to smoke.  Do not let weight gain distract you. Many smokers will gain weight when they quit, usually less than 10 pounds. Eat a healthy diet and stay active. You can always lose the weight gained after you quit.  Find ways to improve your mood other than smoking. FOR MORE INFORMATION  www.smokefree.gov  Document Released: 09/11/2001 Document Revised: 02/01/2014 Document Reviewed: 12/27/2011 [employer] Patient Information 2015 [employer]. This information is not intended to replace advice given to you by your health care provider. Make sure you discuss any questions you have with your health care provider.  

## 2015-05-02 NOTE — Progress Notes (Signed)
[place] [phone]   [phone]  OFFICE PROGRESS NOTE  [name], MD [place] [address]  DIAGNOSIS: Stage IV (T1b, N3, M1 B) non-small cell lung cancer favoring adenocarcinoma diagnosed in June 2016 presented with right upper lobe nodule in addition to multiple bilateral pulmonary nodules as well as mediastinal and supraclavicular lymphadenopathy and metastatic disease to the bones.  PRIOR THERAPY: None  CURRENT THERAPY: None  INTERVAL HISTORY: Mark Hurst 64 y.o. male returns to the clinic today for follow-up visit accompanied by his brother and friend. The patient was referred to gastroenterology as well as dental medicine for evaluation of his condition and consideration of colonoscopy as well as dental extraction but he did not follow-up with his physicians as a scheduled. He also had an appointment for MRI of the brain that he missed. He continues to complain of low back pain. He is currently on Vicodin with minimal improvement. He continues to complain of increasing fatigue and weakness. He has no chest pain but continues to have shortness of breath and cough with no hemoptysis. He is here today for reevaluation.  MEDICAL HISTORY: Past Medical History  Diagnosis Date  . WPW (Wolff-Parkinson-White syndrome)     ablated  . DM (dermatomyositis)     x 10 years  . Hyperlipidemia     x 13 years  . Tobacco abuse   . CAD (coronary artery disease)     1997 LAD 95% stenosis. He had Rotablator of small  couple lesions in this astery. His last stress perfusion study was in 2001 with no evidence of ischemia.  . Iliac artery occlusion, right   . Hypertension     x 13 years  . Anginal pain   . Type II diabetes mellitus     Type 2  . Lung cancer     stage 4 - with mets  . Hepatitis A 1972    "in [employer]"    ALLERGIES:  is allergic to codeine.  MEDICATIONS:  Current Outpatient Prescriptions  Medication Sig  Dispense Refill  . aspirin 81 MG tablet Take 81 mg by mouth daily.      Marland Kitchen atorvastatin (LIPITOR) 80 MG tablet Take 40 mg by mouth daily at 6 PM.    . bisacodyl (DULCOLAX) 5 MG EC tablet Take 5 mg by mouth daily as needed for mild constipation or moderate constipation.    . Cholecalciferol 2000 UNITS TABS Take 2,000 Units by mouth daily.    . Fish Oil OIL Take 1,000 mg by mouth 2 (two) times daily.     . insulin regular (NOVOLIN R,HUMULIN R) 100 units/mL injection Inject 20 Units into the skin every morning.    Marland Kitchen lisinopril (PRINIVIL,ZESTRIL) 40 MG tablet Take 20 mg daily. 30 tablet 6  . loratadine (CLARITIN) 10 MG tablet Take 10 mg by mouth daily.      . metFORMIN (GLUCOPHAGE) 500 MG tablet Take 1,000 mg by mouth 2 (two) times daily with a meal.     . metoprolol (LOPRESSOR) 50 MG tablet Take 25 mg by mouth daily.    . Multiple Vitamin (MULTIVITAMIN) tablet Take 1 tablet by mouth daily.    . tamsulosin (FLOMAX) 0.4 MG CAPS capsule Take 0.4 mg by mouth daily.    Marland Kitchen Apixaban (ELIQUIS PO) Take 1 tablet by mouth daily.    . ondansetron (ZOFRAN) 8 MG tablet Take by mouth every 8 (eight) hours as needed for nausea  or vomiting.    Marland Kitchen oxyCODONE-acetaminophen (PERCOCET/ROXICET) 5-325 MG per tablet Take 1 tablet by mouth every 6 (six) hours as needed for severe pain. (Patient not taking: Reported on 05/02/2015) 30 tablet 0   No current facility-administered medications for this visit.    SURGICAL HISTORY:  Past Surgical History  Procedure Laterality Date  . Small intestine surgery  ~ 1976    following ingestion of a fish bone  . Fracture surgery    . Colon surgery    . Hip fracture surgery Right 1976?    "have steel plate put in"  . Percutaneous coronary rotoblator intervention (pci-r)  ~ 1998  . Coronary angioplasty  ~ 1998  . Cataract extraction w/ intraocular lens  implant, bilateral Bilateral ~ 2002  . Radiofrequency ablation      for WPW  . Video bronchoscopy with endobronchial navigation N/A  03/23/2015    Procedure: VIDEO BRONCHOSCOPY WITH ENDOBRONCHIAL NAVIGATION;  Surgeon: Collene Gobble, MD;  Location: [facility];  Service: Thoracic;  Laterality: N/A;    REVIEW OF SYSTEMS:  Constitutional: positive for anorexia, fatigue and weight loss Eyes: negative Ears, nose, mouth, throat, and face: negative Respiratory: positive for cough and dyspnea on exertion Cardiovascular: negative Gastrointestinal: negative Genitourinary:negative Integument/breast: negative Hematologic/lymphatic: negative Musculoskeletal:positive for back pain Neurological: negative Behavioral/Psych: negative Endocrine: negative Allergic/Immunologic: negative   PHYSICAL EXAMINATION: General appearance: alert, cooperative, fatigued and no distress Head: Normocephalic, without obvious abnormality, atraumatic Neck: no adenopathy, no JVD, supple, symmetrical, trachea midline and thyroid not enlarged, symmetric, no tenderness/mass/nodules Lymph nodes: Cervical, supraclavicular, and axillary nodes normal. Resp: wheezes bilaterally Back: symmetric, no curvature. ROM normal. No CVA tenderness. Cardio: regular rate and rhythm, S1, S2 normal, no murmur, click, rub or gallop GI: soft, non-tender; bowel sounds normal; no masses,  no organomegaly Extremities: extremities normal, atraumatic, no cyanosis or edema Neurologic: Alert and oriented X 3, normal strength and tone. Normal symmetric reflexes. Normal coordination and gait  ECOG PERFORMANCE STATUS: 1 - Symptomatic but completely ambulatory  Blood pressure 159/65, pulse 78, temperature 98.2 F (36.8 C), temperature source Oral, resp. rate 19, height '5\' 7"'$  (1.702 m), weight 135 lb (61.236 kg), SpO2 96 %.  LABORATORY DATA: Lab Results  Component Value Date   WBC 6.5 05/02/2015   HGB 13.2 05/02/2015   HCT 39.0 05/02/2015   MCV 89.6 05/02/2015   PLT 220 05/02/2015      Chemistry      Component Value Date/Time   NA 125* 05/02/2015 1043   NA 126* 03/23/2015 0639     K 5.3* 05/02/2015 1043   K 5.2* 03/23/2015 0639   CL 92* 03/23/2015 0639   CO2 28 05/02/2015 1043   CO2 25 03/23/2015 0639   BUN 9.5 05/02/2015 1043   BUN 12 03/23/2015 0639   CREATININE 0.8 05/02/2015 1043   CREATININE 0.87 03/23/2015 0639      Component Value Date/Time   CALCIUM 9.7 05/02/2015 1043   CALCIUM 9.4 03/23/2015 0639   ALKPHOS 95 05/02/2015 1043   ALKPHOS 79 03/16/2015 1504   AST 18 05/02/2015 1043   AST 23 03/16/2015 1504   ALT 18 05/02/2015 1043   ALT 20 03/16/2015 1504   BILITOT 0.26 05/02/2015 1043   BILITOT 0.4 03/16/2015 1504       RADIOGRAPHIC STUDIES: Nm Pet Image Initial (pi) Skull Base To Thigh  04/07/2015   CLINICAL DATA:  Initial treatment strategy for non-small-cell lung cancer. Right-sided primary. New diagnosis. Hepatitis.  EXAM: NUCLEAR MEDICINE PET SKULL  BASE TO THIGH  TECHNIQUE: 7.0 mCi F-18 FDG was injected intravenously. Full-ring PET imaging was performed from the skull base to thigh after the radiotracer. CT data was obtained and used for attenuation correction and anatomic localization.  FASTING BLOOD GLUCOSE:  Value: 140 mg/dl  COMPARISON:  Chest CTs of 03/23/2015 and 03/03/2015.  No prior PET.  FINDINGS: NECK  Low left jugular/ supraclavicular hypermetabolic node. This measures 8 mm and a S.U.V. max of 7.6 on image 47.  CHEST  Hypermetabolism corresponding to the previously described right apical lung mass. This measures 3.4 x 2.9 cm and a S.U.V. max of 14.5 on image 10 of series 8. This may have enlarged minimally since the prior CT.  Mediastinal and right hilar nodal metastasis. Right paratracheal node measures 1.4 cm and a S.U.V. max of 6.6 on image 55.  Right hilar node which measures a S.U.V. max of 6.5 on image 82.  Scattered hypermetabolic pulmonary nodules. An index medial right lower lobe pulmonary nodule measures 1.5 cm and a S.U.V. max of 5.1. Similar in size to on the prior exam.  ABDOMEN/PELVIS  Right adrenal hypermetabolism  corresponding to mild thickening and nodularity. This measures a S.U.V. max of 5.4 on image 117.  A focus of hypermetabolism within the hepatic flexure colon corresponds to an area of underdistention. This measures a S.U.V. max of 10.5 on image 128.  SKELETON  Multifocal osseous metastasis. Index lesion within the L2 vertebral body measures a S.U.V. max of 7.7. There are also lesions within the thoracic spine, sternal body, and right acetabulum.  CT IMAGES PERFORMED FOR ATTENUATION CORRECTION  Bilateral carotid atherosclerosis.  No cervical adenopathy.  Trace right pleural fluid is similar. Multivessel coronary artery atherosclerosis. Centrilobular emphysema. Basilar predominant interstitial lung disease. Mild right renal atrophy. Non aneurysmal infrarenal aortic dilatation at 3.0 cm. Extensive colonic diverticulosis. Right proximal femoral fixation. Right L5 pars defect.  IMPRESSION: 1. Right upper lobe primary bronchogenic carcinoma with cervical/thoracic nodal and osseous metastasis. 2. Scattered hypermetabolic pulmonary nodules. These remain indeterminate and could represent foci of infection of and/or pulmonary metastasis. Consider antibiotic therapy and attention on follow-up. 3. Right adrenal hypermetabolism is suspicious for metastatic disease. 4. Hepatic flexure colonic hypermetabolism is without CT correlate. This could either be re-evaluated at followup or more entirely evaluated with colonoscopy to exclude colonic polyp or carcinoma. 5. Incidental findings, including atherosclerosis within the coronary arteries.   Electronically Signed   By: Abigail Miyamoto M.D.   On: 04/07/2015 11:50    ASSESSMENT AND PLAN: This is a very pleasant 64 years old white male recently diagnosed with a stage IV non-small cell lung cancer, adenocarcinoma with multiple bilateral pulmonary nodules in addition to mediastinal and supraclavicular lymphadenopathy and metastatic bone disease. Unfortunately no significant change  having since his last visit and the patient missed many of his appointment for the dental extraction as well as colonoscopy for evaluation of the hypermetabolic lesion at the hepatic flexure colon.  He also missed his appointment for the MRI of the brain. The molecular studies are still pending but expected to be available later this week. I had a lengthy discussion with the patient and his brother and friend today about his current condition. I strongly encouraged the patient to reschedule his appointment with the dentist as well as gastroenterology. I will reschedule his appointment for the MRI of the brain. For the back pain, I referred the patient to radiation oncology and I started him on tramadol 50 mg by mouth every  6 hours as needed for pain. I will see the patient back for follow-up visit in 2 weeks for reevaluation and more detailed discussion of his systemic therapy. He was advised to call if he has any concerning symptoms in the interval. The patient voices understanding of current disease status and treatment options and is in agreement with the current care plan.  All questions were answered. The patient knows to call the clinic with any problems, questions or concerns. We can certainly see the patient much sooner if necessary.  I spent 15 minutes counseling the patient face to face. The total time spent in the appointment was 25 minutes.  Disclaimer: This note was dictated with voice recognition software. Similar sounding words can inadvertently be transcribed and may not be corrected upon review.

## 2015-05-04 ENCOUNTER — Other Ambulatory Visit (HOSPITAL_COMMUNITY): Payer: Self-pay | Admitting: Dentistry

## 2015-05-04 ENCOUNTER — Encounter: Payer: Self-pay | Admitting: Radiation Oncology

## 2015-05-04 NOTE — Progress Notes (Signed)
  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 Julien Nordmann. Mark Hurst -  Chemotherapy and potentially Xgeva for bony metastases  Pain on a scale of 0-10 is: None since medication with Tramadol    If Spine Met(s), symptoms, if any, include:  Bowel/Bladder retention or incontinence (please describe): Enlarged prostate and voids very frequently on a daily basis  Numbness or weakness in extremities (please describe): Reports unsteady gait this am.  Travel by wheelchair.  Feel in past during episode of chest pain - Afib.   Current Decadron regimen, if applicable: None  Ambulatory status? Walker? Wheelchair?: Ambulatory with Assistance and travel by wheelchair  SAFETY ISSUES:  Prior radiation? No  Pacemaker/ICD? No  Possible current pregnancy? N/A  Is the patient on methotrexate? No  Current Complaints / other details:

## 2015-05-05 ENCOUNTER — Encounter: Payer: Self-pay | Admitting: Radiation Oncology

## 2015-05-05 ENCOUNTER — Ambulatory Visit
Admission: RE | Admit: 2015-05-05 | Discharge: 2015-05-05 | Disposition: A | Discharge status: Home / Self Care | Payer: Medicare PPO | Source: Ambulatory Visit | Attending: Radiation Oncology | Admitting: Radiation Oncology

## 2015-05-05 ENCOUNTER — Ambulatory Visit (HOSPITAL_COMMUNITY)
Admission: RE | Admit: 2015-05-05 | Discharge: 2015-05-05 | Disposition: A | Discharge status: Home / Self Care | Payer: Medicare PPO | Source: Ambulatory Visit | Attending: Internal Medicine | Admitting: Internal Medicine

## 2015-05-05 ENCOUNTER — Other Ambulatory Visit: Payer: Self-pay | Admitting: Medical Oncology

## 2015-05-05 ENCOUNTER — Telehealth: Payer: Self-pay | Admitting: Medical Oncology

## 2015-05-05 VITALS — BP 137/66 | HR 81 | Temp 97.5°F

## 2015-05-05 DIAGNOSIS — C7951 Secondary malignant neoplasm of bone: Secondary | ICD-10-CM

## 2015-05-05 DIAGNOSIS — C3491 Malignant neoplasm of unspecified part of right bronchus or lung: Secondary | ICD-10-CM

## 2015-05-05 DIAGNOSIS — R930 Abnormal findings on diagnostic imaging of skull and head, not elsewhere classified: Secondary | ICD-10-CM | POA: Diagnosis not present

## 2015-05-05 DIAGNOSIS — C7952 Secondary malignant neoplasm of bone marrow: Principal | ICD-10-CM

## 2015-05-05 LAB — AFB CULTURE WITH SMEAR (NOT AT ARMC): Acid Fast Smear: NONE SEEN

## 2015-05-05 MED ORDER — GADOBENATE DIMEGLUMINE 529 MG/ML IV SOLN
15.0000 mL | Freq: Once | INTRAVENOUS | Status: AC | PRN
  Administered 2015-05-05: 12 mL via INTRAVENOUS

## 2015-05-05 NOTE — Progress Notes (Signed)
[facility] NEW PATIENT EVALUATION  Name: Mark Hurst MRN: [id number]  Date:   05/05/2015           DOB: [birth year]  Status: outpatient   CC: Mark Gong, MD  Curt Bears, MD    REFERRING PHYSICIAN: Curt Bears, MD   DIAGNOSIS: Stage IV (T1b  N3  M1) non-small cell carcinoma of the right lung metastatic to bone  HISTORY OF PRESENT ILLNESS:  Mark Hurst is a 64 y.o. male who is seen today through the courtesy of Dr. Inda Merlin for consideration of a brief course of palliative radiotherapy in the management of his metastatic non-small cell carcinoma of the lung.  After a cardiac stress test, he was admitted to the hospital with chest pain.  During his evaluation a chest x-ray was obtained on 03/03/2015 which showed a nodular density within the right upper lobe.  There also multiple scattered peripheral nodules.  CT the chest on 03/23/2015 showed a right upper lobe mass worrisome for a primary bronchogenic  carcinoma along with pathologically enlarged right paratracheal lymph nodes and borderline enlargement of mediastinal lymph nodes.  He underwent bronchoscopy with cytology showing non-small cell carcinoma, favoring adenocarcinoma.  A PET scan on July 7 showed a right upper lobe primary bronchogenic carcinoma with right adrenal hypermetabolism suspicious for metastatic disease as well as hepatic flexure colonic hypermetabolism.  There also appeared to be metastatic lesions to bone involving the thoracic spine, lumbar spine, sternal body and right acetabulum.  Along the lumbar spine disease was seen at L2.  The patient was seen in consultation by Dr. Julien Nordmann he plans on giving systemic chemotherapy after he has had dental extractions.  He gives a three-week history of having mid to lower back pain which occasionally involves his right hip and proximal femur.  He was intolerant of oxycodone and has been taking tramadol alone for his pain.  He has been walking  with a cane for the past week.  He denies lower extremity numbness or weakness he does feel unsteady on his feet.  He has not had a bowel movement in 3 days.  No difficulty with bladder control.  He is lost 7 pounds over the past 3 weeks and his appetite is poor.  His brain MRI today is without evidence for metastatic disease.  He will undergo colonoscopy for further evaluation of his PET scan findings and also history of change in his bowel habits.  PREVIOUS RADIATION THERAPY: No   PAST MEDICAL HISTORY:  has a past medical history of WPW (Wolff-Parkinson-White syndrome); DM (dermatomyositis); Hyperlipidemia; Tobacco abuse; CAD (coronary artery disease); Iliac artery occlusion, right; Hypertension; Anginal pain; Type II diabetes mellitus; Lung cancer; and Hepatitis A (1972).     PAST SURGICAL HISTORY:  Past Surgical History  Procedure Laterality Date  . Small intestine surgery  ~ 1976    following ingestion of a fish bone  . Fracture surgery    . Colon surgery    . Hip fracture surgery Right 1976?    "have steel plate put in"  . Percutaneous coronary rotoblator intervention (pci-r)  ~ 1998  . Coronary angioplasty  ~ 1998  . Cataract extraction w/ intraocular lens  implant, bilateral Bilateral ~ 2002  . Radiofrequency ablation      for WPW  . Video bronchoscopy with endobronchial navigation N/A 03/23/2015    Procedure: VIDEO BRONCHOSCOPY WITH ENDOBRONCHIAL NAVIGATION;  Surgeon: Collene Gobble, MD;  Location: [facility];  Service: Thoracic;  Laterality:  N/A;     FAMILY HISTORY: family history includes Bladder Cancer in his brother; Cancer in his father; Colon cancer in his mother; Coronary artery disease (age of onset: 54) in his father; Depression in his mother; Diabetes in his mother; Esophageal cancer in his brother; Lung cancer in his father.    SOCIAL HISTORY:  reports that he has been smoking Cigarettes.  He has a 94 pack-year smoking history. He has never used smokeless tobacco. He  reports that he drinks about 2.4 oz of alcohol per week. He reports that he does not use illicit drugs.  Single, no children.  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   ALLERGIES: Codeine   MEDICATIONS:  Current Outpatient Prescriptions  Medication Sig Dispense Refill  . aspirin 81 MG tablet Take 81 mg by mouth daily.      Marland Kitchen atorvastatin (LIPITOR) 80 MG tablet Take 40 mg by mouth daily at 6 PM.    . bisacodyl (DULCOLAX) 5 MG EC tablet Take 5 mg by mouth daily as needed for mild constipation or moderate constipation.    . Cholecalciferol 2000 UNITS TABS Take 2,000 Units by mouth daily.    . Fish Oil OIL Take 1,000 mg by mouth 2 (two) times daily.     . insulin regular (NOVOLIN R,HUMULIN R) 100 units/mL injection Inject 20 Units into the skin every morning.    Marland Kitchen lisinopril (PRINIVIL,ZESTRIL) 40 MG tablet Take 20 mg daily. 30 tablet 6  . loratadine (CLARITIN) 10 MG tablet Take 10 mg by mouth daily.      . metFORMIN (GLUCOPHAGE) 500 MG tablet Take 1,000 mg by mouth 2 (two) times daily with a meal.     . metoprolol (LOPRESSOR) 50 MG tablet Take 25 mg by mouth daily.    . Multiple Vitamin (MULTIVITAMIN) tablet Take 1 tablet by mouth daily.    . ondansetron (ZOFRAN) 8 MG tablet Take by mouth every 8 (eight) hours as needed for nausea or vomiting.    . traMADol (ULTRAM) 50 MG tablet Take 1 tablet (50 mg total) by mouth every 6 (six) hours as needed. 60 tablet 0  . Apixaban (ELIQUIS PO) Take 1 tablet by mouth daily.    Marland Kitchen oxyCODONE-acetaminophen (PERCOCET/ROXICET) 5-325 MG per tablet Take 1 tablet by mouth every 6 (six) hours as needed for severe pain. (Patient not taking: Reported on 05/02/2015) 30 tablet 0  . tamsulosin (FLOMAX) 0.4 MG CAPS capsule Take 0.4 mg by mouth daily.     No current facility-administered medications for this encounter.     REVIEW OF SYSTEMS:  Pertinent items are noted in HPI.    PHYSICAL EXAM:  temperature is 97.5 F  (36.4 C). His blood pressure is 137/66 and his pulse is 81. His oxygen saturation is 96%.   Alert and oriented 64 year old white male appearing order than his stated age.  Head and neck examination: Grossly unremarkable.  Nodes: Without palpable cervical or supraclavicular lymphadenopathy.  Chest: Scattered rhonchi throughout the right mid to upper lung zone.  Breath sounds distant.  Back: There is pain described at approximately L2-3 and also along his right hip.  Gait not tested.  Neurologic examination: Grossly nonfocal.   LABORATORY DATA:  Lab Results  Component Value Date   WBC 6.5 05/02/2015   HGB 13.2 05/02/2015   HCT 39.0 05/02/2015   MCV 89.6 05/02/2015   PLT 220 05/02/2015   Lab Results  Component Value Date  NA 125* 05/02/2015   K 5.3* 05/02/2015   CL 92* 03/23/2015   CO2 28 05/02/2015   Lab Results  Component Value Date   ALT 18 05/02/2015   AST 18 05/02/2015   ALKPHOS 95 05/02/2015   BILITOT 0.26 05/02/2015      IMPRESSION: Stage IV metastatic adenocarcinoma the lung to bone.  I suspect that he is symptomatic from his disease at L2 and also along his right acetabulum.  It is unclear as to whether not he is symptomatic from his suspected right adrenal metastasis.  I feel he would benefit from a short course of palliative radiotherapy to his L2 vertebra and right acetabulum.  I plan to deliver 1800 cGy in 3 sessions.  He will return tomorrow morning for CT simulation and begin his treatment early next week.  We discussed the potential acute and late toxicities of radiation therapy which should be well tolerated although he may be at risk for nausea.  Consent is signed today.   PLAN: As discussed above.  I spent 45  minutes face to face with the patient and more than 50% of that time was spent in counseling and/or coordination of care.

## 2015-05-05 NOTE — Telephone Encounter (Signed)
Asking if he can take tramadol and ativan at same time. I told him to take them separate 30 minutes apart today due both needed today due to back pain and claustrophobia for MRI at 1300

## 2015-05-06 ENCOUNTER — Ambulatory Visit
Admission: RE | Admit: 2015-05-06 | Discharge: 2015-05-06 | Disposition: A | Discharge status: Home / Self Care | Payer: Medicare PPO | Source: Ambulatory Visit | Attending: Radiation Oncology | Admitting: Radiation Oncology

## 2015-05-06 ENCOUNTER — Encounter: Payer: Self-pay | Admitting: Radiation Oncology

## 2015-05-06 DIAGNOSIS — C7951 Secondary malignant neoplasm of bone: Secondary | ICD-10-CM

## 2015-05-06 NOTE — Progress Notes (Signed)
3-D simulation note: The patient completed 3-D simulation for treatment to his lumbar spine/right adrenal gland and also treatment planning 04 his right acetabulum.  He was set up AP PA to both fields.  15 MV photons were used for his lumbar spine field and also right acetabulum.  Dose volume histograms were obtained for the kidneys, spinal cord, and also target structures.  We met our department will guidelines.  4 separate and unique multileaf collimators including one wedge were used representing 4 complex treatment devices.  I am prescribing 1800 cGy 3 sessions to both sites.

## 2015-05-06 NOTE — Progress Notes (Signed)
Complex simulation/treatment planning note: The patient was taken to the CT simulator.  A Vac lock immobilization device was constructed.  His abdomen and pelvis was scanned.  I chose isocenters along the L2 vertebra and also along the right acetabulum.  The CT data set was sent to the planning system where I contoured his clinical target volumes including the right adrenal gland.  He was set up to AP and PA fields.  2 unique sets of multileaf collimators were designed for the right adrenal/lumbar spine fields, and 2 sets were also designed for the right acetabulum.  He is now ready for 3-D simulation.  I prescribing 1800 cGy in 3 sessions.

## 2015-05-09 ENCOUNTER — Telehealth (HOSPITAL_COMMUNITY): Payer: Self-pay | Admitting: Dentistry

## 2015-05-09 ENCOUNTER — Encounter (HOSPITAL_COMMUNITY): Payer: Self-pay | Admitting: *Deleted

## 2015-05-09 NOTE — Telephone Encounter (Signed)
05/09/2015  Patient:            Mark Hurst Date of Birth:  [birth year] MRN:                [id number]    I received a telephone call from patient requesting information regarding his anticipated oral surgery on Wednesday and its effect on his scheduled radiation therapy for Tuesday, Wednesday, and Thursday. I indicated to the patient that he most likely would be able to proceed with radiation therapy on these dates, but needed to contact the radiation oncologist to make the ultimate decision. Dr. Meta Hatchet radiation oncologist, is currently on vacation and patient was instructed to contact Dr. Marlane Hatcher oncology to answer the patient's question for him.  Dr. Enrique Sack

## 2015-05-10 ENCOUNTER — Ambulatory Visit
Admission: RE | Admit: 2015-05-10 | Discharge: 2015-05-10 | Disposition: A | Discharge status: Home / Self Care | Payer: Medicare PPO | Source: Ambulatory Visit | Attending: Radiation Oncology | Admitting: Radiation Oncology

## 2015-05-10 ENCOUNTER — Telehealth: Payer: Self-pay | Admitting: *Deleted

## 2015-05-10 DIAGNOSIS — C7951 Secondary malignant neoplasm of bone: Secondary | ICD-10-CM | POA: Diagnosis not present

## 2015-05-10 NOTE — Telephone Encounter (Signed)
Called p[atient home phone and answering machine came on and said ,the person you are trying to reach is not accepting calls at this time, asked Lianc 2, if Mark Hurst or other therapist had tried to reach him to let him know of his treatment today, they sdaid no 2:51 PM

## 2015-05-11 ENCOUNTER — Encounter (HOSPITAL_COMMUNITY)
Admission: RE | Disposition: A | Discharge status: Home / Self Care | Payer: Self-pay | Source: Ambulatory Visit | Attending: Dentistry

## 2015-05-11 ENCOUNTER — Ambulatory Visit
Admit: 2015-05-11 | Discharge: 2015-05-11 | Disposition: A | Discharge status: Home / Self Care | Payer: Medicare PPO | Attending: Radiation Oncology | Admitting: Radiation Oncology

## 2015-05-11 ENCOUNTER — Ambulatory Visit (HOSPITAL_COMMUNITY)
Admission: RE | Admit: 2015-05-11 | Discharge: 2015-05-11 | Disposition: A | Discharge status: Home / Self Care | Payer: Medicare PPO | Source: Ambulatory Visit | Attending: Dentistry | Admitting: Dentistry

## 2015-05-11 ENCOUNTER — Ambulatory Visit (HOSPITAL_COMMUNITY): Payer: Medicare PPO | Admitting: Anesthesiology

## 2015-05-11 ENCOUNTER — Ambulatory Visit: Payer: Medicare PPO

## 2015-05-11 ENCOUNTER — Encounter (HOSPITAL_COMMUNITY): Payer: Self-pay | Admitting: Certified Registered Nurse Anesthetist

## 2015-05-11 DIAGNOSIS — E785 Hyperlipidemia, unspecified: Secondary | ICD-10-CM | POA: Diagnosis not present

## 2015-05-11 DIAGNOSIS — K083 Retained dental root: Secondary | ICD-10-CM | POA: Diagnosis present

## 2015-05-11 DIAGNOSIS — Z7982 Long term (current) use of aspirin: Secondary | ICD-10-CM | POA: Diagnosis not present

## 2015-05-11 DIAGNOSIS — I10 Essential (primary) hypertension: Secondary | ICD-10-CM | POA: Diagnosis not present

## 2015-05-11 DIAGNOSIS — I739 Peripheral vascular disease, unspecified: Secondary | ICD-10-CM | POA: Diagnosis not present

## 2015-05-11 DIAGNOSIS — C3491 Malignant neoplasm of unspecified part of right bronchus or lung: Secondary | ICD-10-CM

## 2015-05-11 DIAGNOSIS — K045 Chronic apical periodontitis: Secondary | ICD-10-CM | POA: Insufficient documentation

## 2015-05-11 DIAGNOSIS — C3411 Malignant neoplasm of upper lobe, right bronchus or lung: Secondary | ICD-10-CM | POA: Insufficient documentation

## 2015-05-11 DIAGNOSIS — Z87891 Personal history of nicotine dependence: Secondary | ICD-10-CM | POA: Insufficient documentation

## 2015-05-11 DIAGNOSIS — K029 Dental caries, unspecified: Secondary | ICD-10-CM | POA: Diagnosis present

## 2015-05-11 DIAGNOSIS — J449 Chronic obstructive pulmonary disease, unspecified: Secondary | ICD-10-CM | POA: Diagnosis not present

## 2015-05-11 DIAGNOSIS — K088 Other specified disorders of teeth and supporting structures: Secondary | ICD-10-CM | POA: Insufficient documentation

## 2015-05-11 DIAGNOSIS — Z7901 Long term (current) use of anticoagulants: Secondary | ICD-10-CM | POA: Diagnosis not present

## 2015-05-11 DIAGNOSIS — M3390 Dermatopolymyositis, unspecified, organ involvement unspecified: Secondary | ICD-10-CM | POA: Insufficient documentation

## 2015-05-11 DIAGNOSIS — Z794 Long term (current) use of insulin: Secondary | ICD-10-CM | POA: Insufficient documentation

## 2015-05-11 DIAGNOSIS — K0530 Chronic periodontitis, unspecified: Secondary | ICD-10-CM | POA: Diagnosis present

## 2015-05-11 DIAGNOSIS — I4891 Unspecified atrial fibrillation: Secondary | ICD-10-CM | POA: Diagnosis not present

## 2015-05-11 DIAGNOSIS — I2510 Atherosclerotic heart disease of native coronary artery without angina pectoris: Secondary | ICD-10-CM | POA: Diagnosis not present

## 2015-05-11 DIAGNOSIS — C7951 Secondary malignant neoplasm of bone: Secondary | ICD-10-CM | POA: Diagnosis not present

## 2015-05-11 DIAGNOSIS — Z79899 Other long term (current) drug therapy: Secondary | ICD-10-CM | POA: Diagnosis not present

## 2015-05-11 DIAGNOSIS — R0602 Shortness of breath: Secondary | ICD-10-CM | POA: Diagnosis not present

## 2015-05-11 DIAGNOSIS — E119 Type 2 diabetes mellitus without complications: Secondary | ICD-10-CM | POA: Diagnosis not present

## 2015-05-11 DIAGNOSIS — Z01818 Encounter for other preprocedural examination: Secondary | ICD-10-CM

## 2015-05-11 HISTORY — PX: MULTIPLE EXTRACTIONS WITH ALVEOLOPLASTY: SHX5342

## 2015-05-11 HISTORY — DX: Reserved for inherently not codable concepts without codable children: IMO0001

## 2015-05-11 LAB — COMPREHENSIVE METABOLIC PANEL
ALK PHOS: 101 U/L (ref 38–126)
ALT: 25 U/L (ref 17–63)
AST: 22 U/L (ref 15–41)
Albumin: 3.3 g/dL — ABNORMAL LOW (ref 3.5–5.0)
Anion gap: 9 (ref 5–15)
BUN: 25 mg/dL — AB (ref 6–20)
CHLORIDE: 87 mmol/L — AB (ref 101–111)
CO2: 28 mmol/L (ref 22–32)
CREATININE: 0.94 mg/dL (ref 0.61–1.24)
Calcium: 10.6 mg/dL — ABNORMAL HIGH (ref 8.9–10.3)
GLUCOSE: 148 mg/dL — AB (ref 65–99)
Potassium: 6.2 mmol/L (ref 3.5–5.1)
Sodium: 124 mmol/L — ABNORMAL LOW (ref 135–145)
Total Bilirubin: 0.6 mg/dL (ref 0.3–1.2)
Total Protein: 7.4 g/dL (ref 6.5–8.1)

## 2015-05-11 LAB — GLUCOSE, CAPILLARY
GLUCOSE-CAPILLARY: 157 mg/dL — AB (ref 65–99)
Glucose-Capillary: 139 mg/dL — ABNORMAL HIGH (ref 65–99)

## 2015-05-11 SURGERY — MULTIPLE EXTRACTION WITH ALVEOLOPLASTY
Anesthesia: General | Site: Mouth

## 2015-05-11 MED ORDER — ISOPROPYL ALCOHOL 70 % SOLN
Status: AC
  Filled 2015-05-11: qty 480

## 2015-05-11 MED ORDER — CEFAZOLIN SODIUM-DEXTROSE 2-3 GM-% IV SOLR
2.0000 g | Freq: Once | INTRAVENOUS | Status: AC
  Administered 2015-05-11: 2 g via INTRAVENOUS

## 2015-05-11 MED ORDER — HYDROCODONE-ACETAMINOPHEN 5-325 MG PO TABS: 1.0000 | ORAL_TABLET | Freq: Four times a day (QID) | ORAL | Status: DC | PRN

## 2015-05-11 MED ORDER — OXYMETAZOLINE HCL 0.05 % NA SOLN
NASAL | Status: AC
  Filled 2015-05-11: qty 15

## 2015-05-11 MED ORDER — PROPOFOL 10 MG/ML IV BOLUS
INTRAVENOUS | Status: AC
  Filled 2015-05-11: qty 20

## 2015-05-11 MED ORDER — LIDOCAINE-EPINEPHRINE 2 %-1:100000 IJ SOLN
INTRAMUSCULAR | Status: DC | PRN
  Administered 2015-05-11 (×5): 1.7 mL

## 2015-05-11 MED ORDER — LABETALOL HCL 5 MG/ML IV SOLN
10.0000 mg | INTRAVENOUS | Status: DC | PRN
  Administered 2015-05-11 (×3): 10 mg via INTRAVENOUS

## 2015-05-11 MED ORDER — PHENYLEPHRINE HCL 10 MG/ML IJ SOLN
INTRAMUSCULAR | Status: DC | PRN
  Administered 2015-05-11: 120 ug via INTRAVENOUS

## 2015-05-11 MED ORDER — HYDROCODONE-ACETAMINOPHEN 5-325 MG PO TABS: 1.0000 | ORAL_TABLET | ORAL | Status: DC | PRN

## 2015-05-11 MED ORDER — MIDAZOLAM HCL 5 MG/5ML IJ SOLN
INTRAMUSCULAR | Status: DC | PRN
  Administered 2015-05-11: 2 mg via INTRAVENOUS

## 2015-05-11 MED ORDER — FENTANYL CITRATE (PF) 250 MCG/5ML IJ SOLN
INTRAMUSCULAR | Status: AC
  Filled 2015-05-11: qty 25

## 2015-05-11 MED ORDER — KETOROLAC TROMETHAMINE 30 MG/ML IJ SOLN
INTRAMUSCULAR | Status: AC
  Filled 2015-05-11: qty 1

## 2015-05-11 MED ORDER — KETOROLAC TROMETHAMINE 30 MG/ML IJ SOLN
30.0000 mg | Freq: Once | INTRAMUSCULAR | Status: AC
  Administered 2015-05-11: 30 mg via INTRAVENOUS

## 2015-05-11 MED ORDER — OXYMETAZOLINE HCL 0.05 % NA SOLN
NASAL | Status: DC | PRN
  Administered 2015-05-11 (×2): 2 via NASAL

## 2015-05-11 MED ORDER — LEVALBUTEROL HCL 0.63 MG/3ML IN NEBU
0.6300 mg | INHALATION_SOLUTION | Freq: Once | RESPIRATORY_TRACT | Status: AC
  Administered 2015-05-11: 0.63 mg via RESPIRATORY_TRACT

## 2015-05-11 MED ORDER — BUPIVACAINE-EPINEPHRINE (PF) 0.5% -1:200000 IJ SOLN
INTRAMUSCULAR | Status: AC
  Filled 2015-05-11: qty 3.6

## 2015-05-11 MED ORDER — PROMETHAZINE HCL 25 MG/ML IJ SOLN: 6.2500 mg | INTRAMUSCULAR | Status: DC | PRN

## 2015-05-11 MED ORDER — ONDANSETRON HCL 4 MG/2ML IJ SOLN
INTRAMUSCULAR | Status: AC
  Filled 2015-05-11: qty 2

## 2015-05-11 MED ORDER — FENTANYL CITRATE (PF) 100 MCG/2ML IJ SOLN
INTRAMUSCULAR | Status: DC | PRN
  Administered 2015-05-11: 50 ug via INTRAVENOUS
  Administered 2015-05-11: 100 ug via INTRAVENOUS

## 2015-05-11 MED ORDER — MIDAZOLAM HCL 2 MG/2ML IJ SOLN
INTRAMUSCULAR | Status: AC
  Filled 2015-05-11: qty 4

## 2015-05-11 MED ORDER — CEFAZOLIN SODIUM-DEXTROSE 2-3 GM-% IV SOLR
INTRAVENOUS | Status: AC
  Filled 2015-05-11: qty 50

## 2015-05-11 MED ORDER — BUPIVACAINE-EPINEPHRINE (PF) 0.5% -1:200000 IJ SOLN
INTRAMUSCULAR | Status: DC | PRN
  Administered 2015-05-11 (×2): 1.8 mL

## 2015-05-11 MED ORDER — LACTATED RINGERS IV SOLN
INTRAVENOUS | Status: DC | PRN
  Administered 2015-05-11: 08:00:00 via INTRAVENOUS

## 2015-05-11 MED ORDER — LIDOCAINE HCL (CARDIAC) 20 MG/ML IV SOLN
INTRAVENOUS | Status: DC | PRN
  Administered 2015-05-11: 50 mg via INTRAVENOUS

## 2015-05-11 MED ORDER — LIDOCAINE HCL (CARDIAC) 20 MG/ML IV SOLN
INTRAVENOUS | Status: AC
  Filled 2015-05-11: qty 5

## 2015-05-11 MED ORDER — FENTANYL CITRATE (PF) 100 MCG/2ML IJ SOLN
25.0000 ug | INTRAMUSCULAR | Status: DC | PRN
  Administered 2015-05-11 (×2): 50 ug via INTRAVENOUS

## 2015-05-11 MED ORDER — SUCCINYLCHOLINE CHLORIDE 20 MG/ML IJ SOLN
INTRAMUSCULAR | Status: DC | PRN
  Administered 2015-05-11: 60 mg via INTRAVENOUS

## 2015-05-11 MED ORDER — FENTANYL CITRATE (PF) 100 MCG/2ML IJ SOLN
INTRAMUSCULAR | Status: AC
  Filled 2015-05-11: qty 2

## 2015-05-11 MED ORDER — LIDOCAINE-EPINEPHRINE 2 %-1:100000 IJ SOLN
INTRAMUSCULAR | Status: AC
  Filled 2015-05-11: qty 10.2

## 2015-05-11 MED ORDER — LEVALBUTEROL HCL 0.63 MG/3ML IN NEBU
INHALATION_SOLUTION | RESPIRATORY_TRACT | Status: AC
  Filled 2015-05-11: qty 3

## 2015-05-11 MED ORDER — LACTATED RINGERS IV SOLN: INTRAVENOUS | Status: DC

## 2015-05-11 MED ORDER — ACETAMINOPHEN 10 MG/ML IV SOLN
1000.0000 mg | Freq: Once | INTRAVENOUS | Status: AC
  Administered 2015-05-11: 1000 mg via INTRAVENOUS

## 2015-05-11 MED ORDER — LABETALOL HCL 5 MG/ML IV SOLN
INTRAVENOUS | Status: AC
  Filled 2015-05-11: qty 4

## 2015-05-11 MED ORDER — MEPERIDINE HCL 50 MG/ML IJ SOLN: 6.2500 mg | INTRAMUSCULAR | Status: DC | PRN

## 2015-05-11 MED ORDER — ISOPROPYL ALCOHOL 70 % SOLN
Status: DC | PRN
  Administered 2015-05-11: 1 via TOPICAL

## 2015-05-11 MED ORDER — SODIUM CHLORIDE 0.9 % IV SOLN
INTRAVENOUS | Status: DC | PRN
  Administered 2015-05-11: 09:00:00 via INTRAVENOUS

## 2015-05-11 MED ORDER — ACETAMINOPHEN 10 MG/ML IV SOLN
INTRAVENOUS | Status: AC
  Filled 2015-05-11: qty 100

## 2015-05-11 SURGICAL SUPPLY — 27 items
ATTRACTOMAT 16X20 MAGNETIC DRP (DRAPES) ×3 IMPLANT
BAG ZIPLOCK 12X15 (MISCELLANEOUS) IMPLANT
BANDAGE EYE OVAL (MISCELLANEOUS) ×6 IMPLANT
BLADE SURG 15 STRL LF DISP TIS (BLADE) ×2 IMPLANT
BLADE SURG 15 STRL SS (BLADE) ×4
CANNULA VESSEL W/WING WO/VALVE (CANNULA) ×3 IMPLANT
GAUZE SPONGE 4X4 12PLY STRL (GAUZE/BANDAGES/DRESSINGS) ×3 IMPLANT
GAUZE SPONGE 4X4 16PLY XRAY LF (GAUZE/BANDAGES/DRESSINGS) ×3 IMPLANT
GLOVE BIOGEL PI IND STRL 6 (GLOVE) ×1 IMPLANT
GLOVE BIOGEL PI INDICATOR 6 (GLOVE) ×2
GLOVE SURG ORTHO 8.0 STRL STRW (GLOVE) ×3 IMPLANT
GLOVE SURG SS PI 6.0 STRL IVOR (GLOVE) ×3 IMPLANT
GOWN STRL REUS W/TWL 2XL LVL3 (GOWN DISPOSABLE) ×3 IMPLANT
GOWN STRL REUS W/TWL LRG LVL3 (GOWN DISPOSABLE) ×3 IMPLANT
KIT BASIN OR (CUSTOM PROCEDURE TRAY) ×3 IMPLANT
NS IRRIG 1000ML POUR BTL (IV SOLUTION) ×3 IMPLANT
PACK EENT SPLIT (PACKS) ×3 IMPLANT
PACKING VAGINAL (PACKING) ×3 IMPLANT
SUCTION FRAZIER 12FR DISP (SUCTIONS) IMPLANT
SUT CHROMIC 3 0 PS 2 (SUTURE) ×12 IMPLANT
SUT CHROMIC 4 0 P 3 18 (SUTURE) IMPLANT
SYR 50ML LL SCALE MARK (SYRINGE) ×3 IMPLANT
TOWEL OR 17X26 10 PK STRL BLUE (TOWEL DISPOSABLE) ×3 IMPLANT
TUBING CONNECTING 10 (TUBING) ×2 IMPLANT
TUBING CONNECTING 10' (TUBING) ×1
WATER STERILE IRR 1500ML POUR (IV SOLUTION) ×3 IMPLANT
YANKAUER SUCT BULB TIP NO VENT (SUCTIONS) ×3 IMPLANT

## 2015-05-11 NOTE — Anesthesia Postprocedure Evaluation (Signed)
  Anesthesia Post-op Note  Patient: Mark Hurst  Procedure(s) Performed: Procedure(s) (LRB): Extraction of tooth #'s 2,5,6,7,8,11,12,17, 20,21,22,23,24,25,26,27,29 with alveoloplasty (N/A)  Patient Location: PACU  Anesthesia Type: General  Level of Consciousness: awake and alert   Airway and Oxygen Therapy: Patient Spontanous Breathing  Post-op Pain: mild  Post-op Assessment: Post-op Vital signs reviewed, Patient's Cardiovascular Status Stable, Respiratory Function Stable, Patent Airway and No signs of Nausea or vomiting  Last Vitals:  Filed Vitals:   05/11/15 1441  BP: 143/43  Pulse: 86  Temp: 36.7 C  Resp: 16    Post-op Vital Signs: stable   Complications: No apparent anesthesia complications

## 2015-05-11 NOTE — Discharge Instructions (Signed)

## 2015-05-11 NOTE — Anesthesia Preprocedure Evaluation (Signed)
Anesthesia Evaluation  Patient identified by MRN, date of birth, ID band Patient awake    Reviewed: Allergy & Precautions, NPO status , Patient's Chart, lab work & pertinent test results, reviewed documented beta blocker date and time   History of Anesthesia Complications Negative for: history of anesthetic complications  Airway Mallampati: II  TM Distance: >3 FB Neck ROM: Full    Dental  (+) Poor Dentition, Dental Advisory Given   Pulmonary COPDCurrent Smoker, former smoker,  breath sounds clear to auscultation        Cardiovascular hypertension, Pt. on medications and Pt. on home beta blockers + angina + CAD and + Peripheral Vascular Disease + dysrhythmias Atrial Fibrillation Rhythm:Regular     Neuro/Psych negative neurological ROS  negative psych ROS   GI/Hepatic negative GI ROS, (+) Hepatitis -, A  Endo/Other  diabetes  Renal/GU negative Renal ROS  negative genitourinary   Musculoskeletal negative musculoskeletal ROS (+)   Abdominal   Peds negative pediatric ROS (+)  Hematology negative hematology ROS (+)   Anesthesia Other Findings   Reproductive/Obstetrics negative OB ROS                             Anesthesia Physical  Anesthesia Plan  ASA: III  Anesthesia Plan: General   Post-op Pain Management:    Induction: Intravenous  Airway Management Planned: Nasal ETT  Additional Equipment:   Intra-op Plan:   Post-operative Plan: Extubation in OR  Informed Consent: I have reviewed the patients History and Physical, chart, labs and discussed the procedure including the risks, benefits and alternatives for the proposed anesthesia with the patient or authorized representative who has indicated his/her understanding and acceptance.   Dental advisory given  Plan Discussed with: CRNA, Anesthesiologist and Surgeon  Anesthesia Plan Comments:         Anesthesia Quick  Evaluation

## 2015-05-11 NOTE — Progress Notes (Signed)
   05/11/15 1320  PACU Vital Signs  Pulse Rate 91  ECG Heart Rate 92  Resp 19  BP (!) 146/58 mmHg  Oxygen Therapy  SpO2 91 %  O2 Device Room Air  MD Carignan previously @ pt bedside, MD ok with o2 sat 90%. Pt has been doing good coughing, lung sounds still diminished but clearer.Pt received breathing tx during PACU stay. Currently, MD Carignan updated prior to discharge from PACU, O2 sats range 87-91% on room air, with average @ 90%. Ok to discharge from PACU per Marcell Barlow. Pt comfortable unlabored breathing.

## 2015-05-11 NOTE — Op Note (Signed)
OPERATIVE REPORT  Patient:            Mark Hurst Date of Birth:  11/29/1950 MRN:                [id number]   DATE OF PROCEDURE:  05/11/2015  PREOPERATIVE DIAGNOSES: 1. Lung cancer with bony metastases 2. Pre-chemotherapy and pre-Xgeva dental protocol 3. Chronic apical periodontitis 4. Multiple retained root segments 5. Dental caries 6. Chronic periodontitis   POSTOPERATIVE DIAGNOSES: 1. Lung cancer with bony metastases 2. Pre-chemotherapy and pre-Xgeva dental protocol 3. Chronic apical periodontitis 4. Multiple retained root segments 5. Dental caries 6. Chronic periodontitis   OPERATIONS: 1. Multiple extraction of tooth numbers 2, 5, 6, 7, 8, 11, 12, 17, 20, 21, 22, 23, 24, 25, 26, 27, and 29 2. 4 Quadrants of alveoloplasty  SURGEON: Lenn Cal, DDS  ANESTHESIA: General anesthesia via nasoendotracheal tube.  MEDICATIONS: 1. Ancef 2 g IV prior to invasive dental procedures. 2. Local anesthesia with a total utilization of 5 carpules each containing 34 mg of lidocaine with 0.017 mg of epinephrine as well as 2 carpules each containing 9 mg of bupivacaine with 0.009 mg of epinephrine.  SPECIMENS: There are 17 teeth that were discarded.  DRAINS: None  CULTURES: None  COMPLICATIONS: None   ESTIMATED BLOOD LOSS: 100 mLs.  INTRAVENOUS FLUIDS: 1000 mLs of Lactated ringers solution and 500 mL of normal saline solution.  INDICATIONS: The patient was recently diagnosed with lung cancer with bony metastasis.  A medically necessary dental consultation was then requested to evaluate poor dentition prior to anticipated chemotherapy and anticipated Xgeva therapy.  The patient was examined and treatment planned for extraction of remaining teeth with alveoloplasty as indicated in the operating room with general anesthesia.  This treatment plan was formulated to decrease the risks and complications associated with dental infection from affecting the patient's systemic  health and to prevent future complications such as drug-induced osteonecrosis of the jaw related to the anticipated Xgeva therapy.  OPERATIVE FINDINGS: Patient was examined operating room number 7.  The teeth were identified for extraction. The patient was noted be affected by chronic apical periodontitis, multiple retained root segments, dental caries, and chronic periodontitis.   DESCRIPTION OF PROCEDURE: Patient was brought to the main operating room number 7. Patient was then placed in the supine position on the operating table. General anesthesia was then induced per the anesthesia team. The patient was then prepped and draped in the usual manner for dental medicine procedure. A timeout was performed. The patient was identified and procedures were verified. A throat pack was placed at this time. The oral cavity was then thoroughly examined with the findings noted above. The patient was then ready for dental medicine procedure as follows:  Local anesthesia was then administered sequentially with a total utilization of 5 carpules each containing 34 mg of lidocaine with 0.017 mg of epinephrine as well as 2 carpules  each containing 9 mg bupivacaine with 0.009 mg of epinephrine.  The Maxillary left and right quadrants first approached. Anesthesia was then delivered utilizing infiltration with lidocaine with epinephrine. A #15 blade incision was then made from the mesial of #1 and extended the distal of #3.  A second 15 blade incision was made from the distal of #4 and extended to the distal of #14. A  surgical flap was then carefully reflected. Appropriate amounts of buccal and interseptal bone were then removed around tooth numbers 5, 6, and 11 utilizing a surgical handpiece and  bur and copious amounts of sterile water.  The maxillary teeth were then subluxated with a series of straight elevators. Tooth numbers 2, 5, 6, 7, 8, 11 were then removed with a 150 forceps without complications. A small  retained root tips in the area of #12 was then removed with a root tip pick without complications. Alveoloplasty was then performed utilizing a ronguers and bone file. The surgical site was then irrigated with copious amounts of sterile saline. The tissues were approximated and trimmed appropriately. The maxillary right surgical site was then closed from the mesial of #1 and extended to the distal of #3 utilizing 3-0 chromic gut suture in a continuous interrupted suture technique 1. The maxillary right surgical site was then further closed from the distal of #4 and extended to the mesial #8 utilizing 3-0 chromic gut suture in a continuous interrupted suture technique 1. The maxillary left surgical site was then closed from the distal of #14 extended the mesial #9 utilizing 3-0 chromic gut suture in a continuous interrupted suture technique 1.  At this point time, the mandibular quadrants were approached. The patient was given bilateral inferior alveolar nerve blocks and long buccal nerve blocks utilizing the bupivacaine with epinephrine. Further infiltration was then achieved utilizing the lidocaine with epinephrine. A 15 blade incision was then made from the distal of number #17 and extended to the distal of #31.  A surgical flap was then carefully reflected. Appropriate amounts of buccal and interseptal bone were then removed utilizing a surgical handpiece and copious amount of sterile water around tooth numbers 20, 21, 22, 27, and 29. The lower teeth were then subluxated with a series of straight elevators. Tooth numbers 17, 20, 21, 22, 23, 24, 25, 26, 27, and 29 were then removed with a 151 forceps. Alveoloplasty was then performed utilizing a rongeurs and bone file. The tissues were approximated and trimmed appropriately. The surgical sites were then irrigated with copious amounts of sterile saline. The mandibular left surgical site was then closed from the distal of  #17 and extended the mesial #24  utilizing 3-0 chromic gut suture in a continuous sharp and suture technique 1. The mandibular right surgical site was then closed from the distal of #31 and extended the mesial #25 utilizing 3-0 chromic gut suture in a continuous interrupted suture technique 1.   At this point time, the entire mouth was irrigated with copious amounts of sterile saline. The patient was examined for complications, seeing none, the dental medicine procedure was deemed to be complete. The throat pack was removed at this time. An oral airway was then placed at the request of the anesthesia team. A series of 4 x 4 gauze were placed in the mouth to aid hemostasis. The patient was then handed over to the anesthesia team for final disposition. After an appropriate amount of time, the patient was extubated and taken to the postanesthsia care unit in good condition. All counts were correct for the dental medicine procedure. Patient was given a prescription for hydrocodone/acetaminophen 5/325 for pain at the patient's request. Patient is to take one to 2 tablets every 6 hours as needed for pain. Patient is not to take the tramadol/Ultram while on this pain medication. Patient is return to clinic in 7-10 days for evaluation for suture removal. Xgeva therapy is to ideally be withheld for an additional 6-8 weeks to allow for healing from the dental extractions. However, the Xgeva therapy may be provided as needed at the discretion of the  medical oncologist.   Lenn Cal, DDS.

## 2015-05-11 NOTE — Progress Notes (Signed)
PRE-OPERATIVE NOTE:  05/11/2015 Mark Hurst [id number]  VITALS: BP 129/53 mmHg  Pulse 94  Temp(Src) 97.4 F (36.3 C) (Oral)  Resp 16  Ht '5\' 7"'$  (1.702 m)  Wt 136 lb (61.689 kg)  BMI 21.30 kg/m2  SpO2 97%  Lab Results  Component Value Date   WBC 6.5 05/02/2015   HGB 13.2 05/02/2015   HCT 39.0 05/02/2015   MCV 89.6 05/02/2015   PLT 220 05/02/2015   BMET    Component Value Date/Time   NA 125* 05/02/2015 1043   NA 126* 03/23/2015 0639   K 5.3* 05/02/2015 1043   K 5.2* 03/23/2015 0639   CL 92* 03/23/2015 0639   CO2 28 05/02/2015 1043   CO2 25 03/23/2015 0639   GLUCOSE 106 05/02/2015 1043   GLUCOSE 112* 03/23/2015 0639   BUN 9.5 05/02/2015 1043   BUN 12 03/23/2015 0639   CREATININE 0.8 05/02/2015 1043   CREATININE 0.87 03/23/2015 0639   CALCIUM 9.7 05/02/2015 1043   CALCIUM 9.4 03/23/2015 0639   GFRNONAA >60 03/23/2015 0639   GFRAA >60 03/23/2015 0639    Lab Results  Component Value Date   INR 0.96 03/23/2015   INR 1.58* 03/16/2015   INR 1.03 03/03/2015   No results found for: PTT   [name] presents for extraction of remaining teeth with alveoloplasty and pre-prosthetic surgery as needed in the operating room with general anesthesia.    SUBJECTIVE: The patient denies any acute medical or dental changes and agrees to proceed with treatment as planned. Patient is currently undergoing active radiation therapy with Dr. Valere Dross to his bony metastasis.  EXAM: No sign of acute dental changes.  ASSESSMENT: Patient is affected by chronic apical periodontitis, multiple retained root segments, dental caries, chronic periodontitis, and tooth mobility.  PLAN: Patient agrees to proceed with treatment as planned in the operating room as previously discussed and accepts the risks, benefits, and complications of the proposed treatment. Patient is aware of the risk for bleeding, bruising, swelling, infection, pain, nerve damage, soft tissue damage, sinus involvement,  root tip fracture, mandible fracture, and the risks of complications associated with the anesthesia. Patient also is aware of the potential for other complications not mentioned above.   Mark Hurst, DDS

## 2015-05-11 NOTE — Anesthesia Procedure Notes (Signed)
Procedure Name: Intubation Performed by: Gean Maidens Pre-anesthesia Checklist: Patient identified, Emergency Drugs available, Suction available, Patient being monitored and Timeout performed Patient Re-evaluated:Patient Re-evaluated prior to inductionOxygen Delivery Method: Circle system utilized Preoxygenation: Pre-oxygenation with 100% oxygen Intubation Type: Inhalational induction with existing ETT Ventilation: Mask ventilation without difficulty Laryngoscope Size: Mac and 3 Grade View: Grade I Nasal Tubes: Right, Magill forceps- large, utilized and Nasal prep performed Tube size: 7.5 mm Number of attempts: 1 Placement Confirmation: ETT inserted through vocal cords under direct vision,  positive ETCO2,  CO2 detector and breath sounds checked- equal and bilateral Tube secured with: Tape Dental Injury: Teeth and Oropharynx as per pre-operative assessment

## 2015-05-11 NOTE — Transfer of Care (Signed)
Immediate Anesthesia Transfer of Care Note  Patient: Mark Hurst  Procedure(s) Performed: Procedure(s): Extraction of tooth #'s 2,5,6,7,8,11,12,17, 20,21,22,23,24,25,26,27,29 with alveoloplasty (N/A)  Patient Location: PACU  Anesthesia Type:General  Level of Consciousness: sedated, patient cooperative and responds to stimulation  Airway & Oxygen Therapy: Patient Spontanous Breathing and Patient connected to face mask oxygen  Post-op Assessment: Report given to RN and Post -op Vital signs reviewed and stable  Post vital signs: Reviewed and stable  Last Vitals:  Filed Vitals:   05/11/15 1045  BP:   Pulse: 97  Temp:   Resp:     Complications: No apparent anesthesia complications

## 2015-05-11 NOTE — Progress Notes (Signed)
Patient request pain med. Understands anesthesia to see him soon and medicate him

## 2015-05-11 NOTE — H&P (Signed)
05/11/2015  Patient:            Mark Hurst Date of Birth:  12/09/1950 MRN:                [id number]   BP 129/53 mmHg  Pulse 94  Temp(Src) 97.4 F (36.3 C) (Oral)  Resp 16  Ht '5\' 7"'$  (1.702 m)  Wt 136 lb (61.689 kg)  BMI 21.30 kg/m2  SpO2 97%  Mark Hurst is a 64 year old male recently diagnosed with lung cancer with bony metastases. Patient was seen as part of a medically necessary prechemotherapy and pre-Xgeva dental protocol examination. The patient now presents for extraction remaining teeth with alveoloplasty and pre-prosthetic surgery as indicated in the operating of general anesthesia. Patient denies any recent dental changes. Please use note from Dr. Inda Merlin dated 05/02/2015 as the H&P for the dental operating room procedure.  Lenn Cal, DDS  Progress Notes    Expand All Collapse All     [place] [phone] [phone]  OFFICE PROGRESS NOTE  [name], MD [place] [address]  DIAGNOSIS: Stage IV (T1b, N3, M1 B) non-small cell lung cancer favoring adenocarcinoma diagnosed in June 2016 presented with right upper lobe nodule in addition to multiple bilateral pulmonary nodules as well as mediastinal and supraclavicular lymphadenopathy and metastatic disease to the bones.  PRIOR THERAPY: None  CURRENT THERAPY: None  INTERVAL HISTORY: Mark Hurst 64 y.o. male returns to the clinic today for follow-up visit accompanied by his brother and friend. The patient was referred to gastroenterology as well as dental medicine for evaluation of his condition and consideration of colonoscopy as well as dental extraction but he did not follow-up with his physicians as a scheduled. He also had an appointment for MRI of the brain that he missed. He continues to complain of low back pain. He is currently on Vicodin with minimal improvement. He continues to complain of increasing fatigue and weakness. He has no  chest pain but continues to have shortness of breath and cough with no hemoptysis. He is here today for reevaluation.  MEDICAL HISTORY: Past Medical History  Diagnosis Date  . WPW (Wolff-Parkinson-White syndrome)     ablated  . DM (dermatomyositis)     x 10 years  . Hyperlipidemia     x 13 years  . Tobacco abuse   . CAD (coronary artery disease)     1997 LAD 95% stenosis. He had Rotablator of small couple lesions in this astery. His last stress perfusion study was in 2001 with no evidence of ischemia.  . Iliac artery occlusion, right   . Hypertension     x 13 years  . Anginal pain   . Type II diabetes mellitus     Type 2  . Lung cancer     stage 4 - with mets  . Hepatitis A 1972    "in [employer]"    ALLERGIES: is allergic to codeine.  MEDICATIONS:  Current Outpatient Prescriptions  Medication Sig Dispense Refill  . aspirin 81 MG tablet Take 81 mg by mouth daily.     Marland Kitchen atorvastatin (LIPITOR) 80 MG tablet Take 40 mg by mouth daily at 6 PM.    . bisacodyl (DULCOLAX) 5 MG EC tablet Take 5 mg by mouth daily as needed for mild constipation or moderate constipation.    . Cholecalciferol 2000 UNITS TABS Take 2,000 Units by mouth daily.    . Fish Oil OIL  Take 1,000 mg by mouth 2 (two) times daily.     . insulin regular (NOVOLIN R,HUMULIN R) 100 units/mL injection Inject 20 Units into the skin every morning.    Marland Kitchen lisinopril (PRINIVIL,ZESTRIL) 40 MG tablet Take 20 mg daily. 30 tablet 6  . loratadine (CLARITIN) 10 MG tablet Take 10 mg by mouth daily.     . metFORMIN (GLUCOPHAGE) 500 MG tablet Take 1,000 mg by mouth 2 (two) times daily with a meal.     . metoprolol (LOPRESSOR) 50 MG tablet Take 25 mg by mouth daily.    . Multiple Vitamin (MULTIVITAMIN) tablet Take 1 tablet by mouth daily.    . tamsulosin (FLOMAX) 0.4 MG CAPS capsule Take 0.4 mg by mouth daily.      Marland Kitchen Apixaban (ELIQUIS PO) Take 1 tablet by mouth daily.    . ondansetron (ZOFRAN) 8 MG tablet Take by mouth every 8 (eight) hours as needed for nausea or vomiting.    Marland Kitchen oxyCODONE-acetaminophen (PERCOCET/ROXICET) 5-325 MG per tablet Take 1 tablet by mouth every 6 (six) hours as needed for severe pain. (Patient not taking: Reported on 05/02/2015) 30 tablet 0   No current facility-administered medications for this visit.    SURGICAL HISTORY:  Past Surgical History  Procedure Laterality Date  . Small intestine surgery  ~ 1976    following ingestion of a fish bone  . Fracture surgery    . Colon surgery    . Hip fracture surgery Right 1976?    "have steel plate put in"  . Percutaneous coronary rotoblator intervention (pci-r)  ~ 1998  . Coronary angioplasty  ~ 1998  . Cataract extraction w/ intraocular lens implant, bilateral Bilateral ~ 2002  . Radiofrequency ablation      for WPW  . Video bronchoscopy with endobronchial navigation N/A 03/23/2015    Procedure: VIDEO BRONCHOSCOPY WITH ENDOBRONCHIAL NAVIGATION; Surgeon: Collene Gobble, MD; Location: [facility]; Service: Thoracic; Laterality: N/A;    REVIEW OF SYSTEMS: Constitutional: positive for anorexia, fatigue and weight loss Eyes: negative Ears, nose, mouth, throat, and face: negative Respiratory: positive for cough and dyspnea on exertion Cardiovascular: negative Gastrointestinal: negative Genitourinary:negative Integument/breast: negative Hematologic/lymphatic: negative Musculoskeletal:positive for back pain Neurological: negative Behavioral/Psych: negative Endocrine: negative Allergic/Immunologic: negative   PHYSICAL EXAMINATION: General appearance: alert, cooperative, fatigued and no distress Head: Normocephalic, without obvious abnormality, atraumatic Neck: no adenopathy, no JVD, supple, symmetrical, trachea midline and thyroid not enlarged, symmetric,  no tenderness/mass/nodules Lymph nodes: Cervical, supraclavicular, and axillary nodes normal. Resp: wheezes bilaterally Back: symmetric, no curvature. ROM normal. No CVA tenderness. Cardio: regular rate and rhythm, S1, S2 normal, no murmur, click, rub or gallop GI: soft, non-tender; bowel sounds normal; no masses, no organomegaly Extremities: extremities normal, atraumatic, no cyanosis or edema Neurologic: Alert and oriented X 3, normal strength and tone. Normal symmetric reflexes. Normal coordination and gait  ECOG PERFORMANCE STATUS: 1 - Symptomatic but completely ambulatory  Blood pressure 159/65, pulse 78, temperature 98.2 F (36.8 C), temperature source Oral, resp. rate 19, height '5\' 7"'$  (1.702 m), weight 135 lb (61.236 kg), SpO2 96 %.  LABORATORY DATA:  Recent Labs    Lab Results  Component Value Date   WBC 6.5 05/02/2015   HGB 13.2 05/02/2015   HCT 39.0 05/02/2015   MCV 89.6 05/02/2015   PLT 220 05/02/2015       Chemistry    Labs (Brief)       Component Value Date/Time   NA 125* 05/02/2015 1043   NA 126*  03/23/2015 0639   K 5.3* 05/02/2015 1043   K 5.2* 03/23/2015 0639   CL 92* 03/23/2015 0639   CO2 28 05/02/2015 1043   CO2 25 03/23/2015 0639   BUN 9.5 05/02/2015 1043   BUN 12 03/23/2015 0639   CREATININE 0.8 05/02/2015 1043   CREATININE 0.87 03/23/2015 0639      Labs (Brief)       Component Value Date/Time   CALCIUM 9.7 05/02/2015 1043   CALCIUM 9.4 03/23/2015 0639   ALKPHOS 95 05/02/2015 1043   ALKPHOS 79 03/16/2015 1504   AST 18 05/02/2015 1043   AST 23 03/16/2015 1504   ALT 18 05/02/2015 1043   ALT 20 03/16/2015 1504   BILITOT 0.26 05/02/2015 1043   BILITOT 0.4 03/16/2015 1504         RADIOGRAPHIC STUDIES:  Imaging Results    Nm Pet Image Initial (pi) Skull Base To Thigh  04/07/2015 CLINICAL DATA: Initial treatment  strategy for non-small-cell lung cancer. Right-sided primary. New diagnosis. Hepatitis. EXAM: NUCLEAR MEDICINE PET SKULL BASE TO THIGH TECHNIQUE: 7.0 mCi F-18 FDG was injected intravenously. Full-ring PET imaging was performed from the skull base to thigh after the radiotracer. CT data was obtained and used for attenuation correction and anatomic localization. FASTING BLOOD GLUCOSE: Value: 140 mg/dl COMPARISON: Chest CTs of 03/23/2015 and 03/03/2015. No prior PET. FINDINGS: NECK Low left jugular/ supraclavicular hypermetabolic node. This measures 8 mm and a S.U.V. max of 7.6 on image 47. CHEST Hypermetabolism corresponding to the previously described right apical lung mass. This measures 3.4 x 2.9 cm and a S.U.V. max of 14.5 on image 10 of series 8. This may have enlarged minimally since the prior CT. Mediastinal and right hilar nodal metastasis. Right paratracheal node measures 1.4 cm and a S.U.V. max of 6.6 on image 55. Right hilar node which measures a S.U.V. max of 6.5 on image 82. Scattered hypermetabolic pulmonary nodules. An index medial right lower lobe pulmonary nodule measures 1.5 cm and a S.U.V. max of 5.1. Similar in size to on the prior exam. ABDOMEN/PELVIS Right adrenal hypermetabolism corresponding to mild thickening and nodularity. This measures a S.U.V. max of 5.4 on image 117. A focus of hypermetabolism within the hepatic flexure colon corresponds to an area of underdistention. This measures a S.U.V. max of 10.5 on image 128. SKELETON Multifocal osseous metastasis. Index lesion within the L2 vertebral body measures a S.U.V. max of 7.7. There are also lesions within the thoracic spine, sternal body, and right acetabulum. CT IMAGES PERFORMED FOR ATTENUATION CORRECTION Bilateral carotid atherosclerosis. No cervical adenopathy. Trace right pleural fluid is similar. Multivessel coronary artery atherosclerosis. Centrilobular emphysema. Basilar predominant interstitial lung disease.  Mild right renal atrophy. Non aneurysmal infrarenal aortic dilatation at 3.0 cm. Extensive colonic diverticulosis. Right proximal femoral fixation. Right L5 pars defect. IMPRESSION: 1. Right upper lobe primary bronchogenic carcinoma with cervical/thoracic nodal and osseous metastasis. 2. Scattered hypermetabolic pulmonary nodules. These remain indeterminate and could represent foci of infection of and/or pulmonary metastasis. Consider antibiotic therapy and attention on follow-up. 3. Right adrenal hypermetabolism is suspicious for metastatic disease. 4. Hepatic flexure colonic hypermetabolism is without CT correlate. This could either be re-evaluated at followup or more entirely evaluated with colonoscopy to exclude colonic polyp or carcinoma. 5. Incidental findings, including atherosclerosis within the coronary arteries. Electronically Signed By: Abigail Miyamoto M.D. On: 04/07/2015 11:50     ASSESSMENT AND PLAN: This is a very pleasant 64 years old white male recently diagnosed with a stage IV non-small  cell lung cancer, adenocarcinoma with multiple bilateral pulmonary nodules in addition to mediastinal and supraclavicular lymphadenopathy and metastatic bone disease. Unfortunately no significant change having since his last visit and the patient missed many of his appointment for the dental extraction as well as colonoscopy for evaluation of the hypermetabolic lesion at the hepatic flexure colon.  He also missed his appointment for the MRI of the brain. The molecular studies are still pending but expected to be available later this week. I had a lengthy discussion with the patient and his brother and friend today about his current condition. I strongly encouraged the patient to reschedule his appointment with the dentist as well as gastroenterology. I will reschedule his appointment for the MRI of the brain. For the back pain, I referred the patient to radiation oncology and I started him on  tramadol 50 mg by mouth every 6 hours as needed for pain. I will see the patient back for follow-up visit in 2 weeks for reevaluation and more detailed discussion of his systemic therapy. He was advised to call if he has any concerning symptoms in the interval. The patient voices understanding of current disease status and treatment options and is in agreement with the current care plan.  All questions were answered. The patient knows to call the clinic with any problems, questions or concerns. We can certainly see the patient much sooner if necessary.  I spent 15 minutes counseling the patient face to face. The total time spent in the appointment was 25 minutes.  Disclaimer: This note was dictated with voice recognition software. Similar sounding words can inadvertently be transcribed and may not be corrected upon review.

## 2015-05-12 ENCOUNTER — Ambulatory Visit
Admit: 2015-05-12 | Discharge: 2015-05-12 | Disposition: A | Discharge status: Home / Self Care | Payer: Medicare PPO | Attending: Radiation Oncology | Admitting: Radiation Oncology

## 2015-05-12 ENCOUNTER — Encounter (HOSPITAL_COMMUNITY): Payer: Self-pay | Admitting: Dentistry

## 2015-05-12 ENCOUNTER — Ambulatory Visit: Payer: Medicare PPO

## 2015-05-12 VITALS — BP 112/98 | HR 119 | Temp 97.8°F | Resp 20

## 2015-05-12 DIAGNOSIS — C7951 Secondary malignant neoplasm of bone: Secondary | ICD-10-CM | POA: Diagnosis not present

## 2015-05-12 LAB — POCT I-STAT 4, (NA,K, GLUC, HGB,HCT)
Glucose, Bld: 134 mg/dL — ABNORMAL HIGH (ref 65–99)
HEMATOCRIT: 35 % — AB (ref 39.0–52.0)
Hemoglobin: 11.9 g/dL — ABNORMAL LOW (ref 13.0–17.0)
Potassium: 5.4 mmol/L — ABNORMAL HIGH (ref 3.5–5.1)
Sodium: 123 mmol/L — ABNORMAL LOW (ref 135–145)

## 2015-05-12 MED ORDER — HYDROCODONE-ACETAMINOPHEN 5-325 MG PO TABS
1.0000 | ORAL_TABLET | Freq: Once | ORAL | Status: AC
  Administered 2015-05-12: 1 via ORAL
  Filled 2015-05-12: qty 1

## 2015-05-12 NOTE — Progress Notes (Addendum)
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  Sitting and rocking in W/C during assessment.  He took Tramadol of unknown dose (since not on his MAR) prior to arriving. Per patient request,r. Sondra Come ordered patient 1 Hydrocodone/ACET 5- '235mg'$  tab given~2:15pm, patient proceeded via W/C with family member to the treatment area.

## 2015-05-13 ENCOUNTER — Ambulatory Visit: Payer: Medicare PPO

## 2015-05-13 ENCOUNTER — Ambulatory Visit
Admit: 2015-05-13 | Discharge: 2015-05-13 | Disposition: A | Discharge status: Home / Self Care | Payer: Medicare PPO | Attending: Radiation Oncology | Admitting: Radiation Oncology

## 2015-05-13 DIAGNOSIS — C7951 Secondary malignant neoplasm of bone: Secondary | ICD-10-CM | POA: Diagnosis not present

## 2015-05-13 LAB — GUARDANT 360

## 2015-05-14 ENCOUNTER — Ambulatory Visit: Payer: Medicare PPO

## 2015-05-16 ENCOUNTER — Telehealth: Payer: Self-pay | Admitting: Physician Assistant

## 2015-05-16 ENCOUNTER — Encounter: Payer: Self-pay | Admitting: General Practice

## 2015-05-16 ENCOUNTER — Encounter: Payer: Self-pay | Admitting: Physician Assistant

## 2015-05-16 ENCOUNTER — Encounter: Payer: Self-pay | Admitting: Radiation Oncology

## 2015-05-16 ENCOUNTER — Other Ambulatory Visit (HOSPITAL_BASED_OUTPATIENT_CLINIC_OR_DEPARTMENT_OTHER): Payer: Medicare PPO

## 2015-05-16 ENCOUNTER — Ambulatory Visit (HOSPITAL_BASED_OUTPATIENT_CLINIC_OR_DEPARTMENT_OTHER): Payer: Medicare PPO | Admitting: Physician Assistant

## 2015-05-16 ENCOUNTER — Telehealth: Payer: Self-pay | Admitting: *Deleted

## 2015-05-16 VITALS — BP 126/52 | HR 91 | Temp 97.8°F | Resp 18 | Ht 67.0 in

## 2015-05-16 DIAGNOSIS — C3411 Malignant neoplasm of upper lobe, right bronchus or lung: Secondary | ICD-10-CM | POA: Diagnosis not present

## 2015-05-16 DIAGNOSIS — C3401 Malignant neoplasm of right main bronchus: Secondary | ICD-10-CM

## 2015-05-16 DIAGNOSIS — C7951 Secondary malignant neoplasm of bone: Secondary | ICD-10-CM | POA: Diagnosis not present

## 2015-05-16 DIAGNOSIS — C3491 Malignant neoplasm of unspecified part of right bronchus or lung: Secondary | ICD-10-CM

## 2015-05-16 LAB — CBC WITH DIFFERENTIAL/PLATELET
BASO%: 0.9 % (ref 0.0–2.0)
BASOS ABS: 0.1 10*3/uL (ref 0.0–0.1)
EOS ABS: 0.1 10*3/uL (ref 0.0–0.5)
EOS%: 1 % (ref 0.0–7.0)
HCT: 41.5 % (ref 38.4–49.9)
HGB: 13.8 g/dL (ref 13.0–17.1)
LYMPH%: 7 % — AB (ref 14.0–49.0)
MCH: 29.7 pg (ref 27.2–33.4)
MCHC: 33.4 g/dL (ref 32.0–36.0)
MCV: 89 fL (ref 79.3–98.0)
MONO#: 0.6 10*3/uL (ref 0.1–0.9)
MONO%: 7.8 % (ref 0.0–14.0)
NEUT%: 83.3 % — AB (ref 39.0–75.0)
NEUTROS ABS: 6.7 10*3/uL — AB (ref 1.5–6.5)
PLATELETS: 316 10*3/uL (ref 140–400)
RBC: 4.66 10*6/uL (ref 4.20–5.82)
RDW: 13.7 % (ref 11.0–14.6)
WBC: 8.1 10*3/uL (ref 4.0–10.3)
lymph#: 0.6 10*3/uL — ABNORMAL LOW (ref 0.9–3.3)

## 2015-05-16 LAB — COMPREHENSIVE METABOLIC PANEL (CC13)
ALT: 24 U/L (ref 0–55)
ANION GAP: 13 meq/L — AB (ref 3–11)
AST: 18 U/L (ref 5–34)
Albumin: 2.8 g/dL — ABNORMAL LOW (ref 3.5–5.0)
Alkaline Phosphatase: 141 U/L (ref 40–150)
BUN: 33.7 mg/dL — ABNORMAL HIGH (ref 7.0–26.0)
CHLORIDE: 89 meq/L — AB (ref 98–109)
CO2: 26 mEq/L (ref 22–29)
Calcium: 11.6 mg/dL — ABNORMAL HIGH (ref 8.4–10.4)
Creatinine: 1.3 mg/dL (ref 0.7–1.3)
EGFR: 56 mL/min/{1.73_m2} — AB (ref >90)
Glucose: 335 mg/dl — ABNORMAL HIGH (ref 70–140)
Potassium: 5.3 mEq/L — ABNORMAL HIGH (ref 3.5–5.1)
Sodium: 128 mEq/L — ABNORMAL LOW (ref 136–145)
Total Bilirubin: 0.29 mg/dL (ref 0.20–1.20)
Total Protein: 7.5 g/dL (ref 6.4–8.3)

## 2015-05-16 MED ORDER — FOLIC ACID 1 MG PO TABS: 1.0000 mg | ORAL_TABLET | Freq: Every day | ORAL | Status: AC

## 2015-05-16 MED ORDER — TRAMADOL HCL 50 MG PO TABS: 50.0000 mg | ORAL_TABLET | Freq: Four times a day (QID) | ORAL | Status: DC | PRN

## 2015-05-16 MED ORDER — DEXAMETHASONE 4 MG PO TABS: ORAL_TABLET | ORAL | Status: AC

## 2015-05-16 MED ORDER — CYANOCOBALAMIN 1000 MCG/ML IJ SOLN
1000.0000 ug | Freq: Once | INTRAMUSCULAR | Status: AC
  Administered 2015-05-16: 1000 ug via INTRAMUSCULAR

## 2015-05-16 NOTE — Progress Notes (Signed)
Spiritual Care Note  Pt's stepfather, a [employer] chaplain in [place], sought [facility] chaplain care in [employer].  Provided pastoral presence and reflective listening as he shared some of his family's story.  Per stepfather, Mark Hurst is the eldest of five children (youngest died of lupus), and brother Mark Hurst (also a cancer survivor) is a significant source of support.  Stepfather used opportunity well to process his own feelings, to verbalize and process his support for his wife, and to seek prayer for pt; he took several chaplain cards to share Martha availability with family.  Consulted with Polo Riley, LCSW, who met pt briefly today.  Will follow for support, but please also page as needs arise.  Thank you.  [place], [place] Pager [phone] Voicemail  [phone]

## 2015-05-16 NOTE — Telephone Encounter (Signed)
Per staff message and POF I have scheduled appts. Advised scheduler of appts. JMW  

## 2015-05-16 NOTE — Patient Instructions (Signed)
Return in one week to begin systemic chemotherapy Follow-up in 2 weeks for symptom management visit

## 2015-05-16 NOTE — Progress Notes (Signed)
[facility] End of Treatment Note  Name:Mark Hurst  Date: 05/16/2015 GZQ:[id number] DOB:September 13, 1951   Status:inpatient    CC: [name], MD  Dr. Curt Bears  REFERRING PHYSICIAN: Dr. Curt Bears     DIAGNOSIS: There were no encounter diagnoses.    INDICATION FOR TREATMENT: Palliative   TREATMENT DATES: 05/10/2015 through 05/13/2015                          SITE/DOSE:   Upper lumbar spine including right adrenal metastasis, and right acetabulum 1800 cGy in 3 sessions                         BEAMS/ENERGY:    Parallel opposed anterior posterior fields to both the upper lumbar spine/right adrenal gland, and right acetabulum.  We used mixed 10 MV/15 MV photons to the lumbar spine/right adrenal gland, and 15 MV photons to the right acetabulum.               NARRATIVE:   [name] continue to have discomfort while taking tramadol.  He was premedicated with hydrocodone/APAP  prior to his second treatment.                        PLAN: Routine followup in one month.  He will move on with systemic therapy through Dr. Inda Merlin.  Patient instructed to call if questions or worsening complaints in interim.

## 2015-05-16 NOTE — Progress Notes (Addendum)
[place] [phone]) [phone]   [phone]  OFFICE PROGRESS NOTE  Mark Gong, MD [place] [address]  DIAGNOSIS: Stage IV (T1b, N3, M1 B) non-small cell lung cancer favoring adenocarcinoma diagnosed in June 2016 presented with right upper lobe nodule in addition to multiple bilateral pulmonary nodules as well as mediastinal and supraclavicular lymphadenopathy and metastatic disease to the bones.  PRIOR THERAPY: None  CURRENT THERAPY: None  INTERVAL HISTORY: Mark Hurst 64 y.o. male returns to the clinic today for follow-up visit accompanied by his brother and friends. The patient recently had a staging MRI of the brain and presents to discuss the results as well as treatment options. He reports that he had his dental extractions under the care of Dr. Enrique Sack last Wednesday and is scheduled to have his stitches removed this coming Monday. He continues to have significant back pain. He recently completed palliative radiotherapy to his back. He requests a refill for his tramadol. He is not able to eat due to his recent dental surgery as well as experiencing some nausea when he does try to eat solid foods. He is currently existing only on ensure, drinking approximately 2-3 a day. He reports having cold sweats but denies any frank fever but also reports that he does not have a thermometer. He continues to have shortness of breath with exertion and with his activities of daily living.  He continues to complain of increasing fatigue and weakness. He has no chest pain but continues to have shortness of breath and cough with no hemoptysis.   MEDICAL HISTORY: Past Medical History  Diagnosis Date  . WPW (Wolff-Parkinson-White syndrome)     ablated  . DM (dermatomyositis)     x 10 years  . Hyperlipidemia     x 13 years  . Tobacco abuse   . CAD (coronary artery disease)     1997 LAD 95% stenosis. He had Rotablator of small  couple lesions  in this astery. His last stress perfusion study was in 2001 with no evidence of ischemia.  . Iliac artery occlusion, right   . Hypertension     x 13 years  . Anginal pain   . Type II diabetes mellitus     Type 2  . Lung cancer     stage 4 - with mets  . Hepatitis A 1972    "in [employer]"  . Shortness of breath dyspnea     ALLERGIES:  is allergic to codeine and percocet.  MEDICATIONS:  Current Outpatient Prescriptions  Medication Sig Dispense Refill  . aspirin 81 MG tablet Take 81 mg by mouth daily.      Marland Kitchen atorvastatin (LIPITOR) 80 MG tablet Take 40 mg by mouth daily at 6 PM.    . bisacodyl (DULCOLAX) 5 MG EC tablet Take 10 mg by mouth daily as needed for mild constipation or moderate constipation.     . Cholecalciferol 2000 UNITS TABS Take 2,000 Units by mouth daily.    . Fish Oil OIL Take 1,000 mg by mouth 2 (two) times daily.     Marland Kitchen HYDROcodone-acetaminophen (NORCO) 5-325 MG per tablet Take 1-2 tablets by mouth every 6 (six) hours as needed for moderate pain or severe pain. 40 tablet 0  . ibuprofen (ADVIL,MOTRIN) 200 MG tablet Take 200 mg by mouth every 6 (six) hours as needed for headache, mild pain or moderate pain.     Marland Kitchen lisinopril (PRINIVIL,ZESTRIL) 40 MG tablet Take  20 mg daily. (Patient taking differently: Take 20 mg by mouth daily. ) 30 tablet 6  . loratadine (CLARITIN) 10 MG tablet Take 10 mg by mouth daily.      . metFORMIN (GLUCOPHAGE) 500 MG tablet Take 1,000 mg by mouth 2 (two) times daily with a meal. Not taking due to not eating    . metoprolol (LOPRESSOR) 50 MG tablet Take 25 mg by mouth 2 (two) times daily.     . Multiple Vitamin (MULTIVITAMIN) tablet Take 1 tablet by mouth daily.    . tamsulosin (FLOMAX) 0.4 MG CAPS capsule Take 0.4 mg by mouth daily.    Marland Kitchen dexamethasone (DECADRON) 4 MG tablet Take 1 tablet by mouth twice a day, the day before, the day of and day after chemotheapy 30 tablet 0  . folic acid (FOLVITE) 1 MG tablet Take 1 tablet (1 mg total) by mouth daily.  30 tablet 3  . insulin regular (NOVOLIN R,HUMULIN R) 100 units/mL injection Inject 20 Units into the skin every morning. Due to not eating patient not taking insulin    . ondansetron (ZOFRAN) 8 MG tablet Take 8 mg by mouth every 8 (eight) hours as needed for nausea or vomiting.     . traMADol (ULTRAM) 50 MG tablet Take 1 tablet (50 mg total) by mouth every 6 (six) hours as needed. 40 tablet 0   No current facility-administered medications for this visit.    SURGICAL HISTORY:  Past Surgical History  Procedure Laterality Date  . Small intestine surgery  ~ 1976    following ingestion of a fish bone  . Fracture surgery    . Colon surgery    . Hip fracture surgery Right 1976?    "have steel plate put in"  . Percutaneous coronary rotoblator intervention (pci-r)  ~ 1998  . Coronary angioplasty  ~ 1998  . Cataract extraction w/ intraocular lens  implant, bilateral Bilateral ~ 2002  . Radiofrequency ablation      for WPW  . Video bronchoscopy with endobronchial navigation N/A 03/23/2015    Procedure: VIDEO BRONCHOSCOPY WITH ENDOBRONCHIAL NAVIGATION;  Surgeon: Collene Gobble, MD;  Location: [facility] OR;  Service: Thoracic;  Laterality: N/A;  . Multiple extractions with alveoloplasty N/A 05/11/2015    Procedure: Extraction of tooth #'s 2,5,6,7,8,11,12,17, 20,21,22,23,24,25,26,27,29 with alveoloplasty;  Surgeon: Lenn Cal, DDS;  Location: [facility] ORS;  Service: Oral Surgery;  Laterality: N/A;    REVIEW OF SYSTEMS:  Constitutional: positive for anorexia, fatigue and weight loss Eyes: negative Ears, nose, mouth, throat, and face: negative Respiratory: positive for cough and dyspnea on exertion Cardiovascular: negative Gastrointestinal: negative Genitourinary:negative Integument/breast: negative Hematologic/lymphatic: negative Musculoskeletal:positive for back pain Neurological: negative Behavioral/Psych: negative Endocrine: negative Allergic/Immunologic: negative   PHYSICAL EXAMINATION:  General appearance: alert, cooperative, fatigued and no distress Head: Normocephalic, without obvious abnormality, atraumatic Neck: no adenopathy, no JVD, supple, symmetrical, trachea midline and thyroid not enlarged, symmetric, no tenderness/mass/nodules Lymph nodes: Cervical, supraclavicular, and axillary nodes normal. Resp: wheezes bilaterally Back: symmetric, no curvature. ROM normal. No CVA tenderness. Cardio: regular rate and rhythm, S1, S2 normal, no murmur, click, rub or gallop GI: soft, non-tender; bowel sounds normal; no masses,  no organomegaly Extremities: extremities normal, atraumatic, no cyanosis or edema Neurologic: Alert and oriented X 3, normal strength and tone. Normal symmetric reflexes. Normal coordination and gait  ECOG PERFORMANCE STATUS: 1 - Symptomatic but completely ambulatory  Blood pressure 126/52, pulse 91, temperature 97.8 F (36.6 C), temperature source Oral, resp. rate 18, height 5'  7" (1.702 m), SpO2 95 %.  LABORATORY DATA: Lab Results  Component Value Date   WBC 8.1 05/16/2015   HGB 13.8 05/16/2015   HCT 41.5 05/16/2015   MCV 89.0 05/16/2015   PLT 316 05/16/2015      Chemistry      Component Value Date/Time   NA 128* 05/16/2015 1140   NA 123* 05/11/2015 0858   K 5.3* 05/16/2015 1140   K 5.4* 05/11/2015 0858   CL 87* 05/11/2015 0740   CO2 26 05/16/2015 1140   CO2 28 05/11/2015 0740   BUN 33.7* 05/16/2015 1140   BUN 25* 05/11/2015 0740   CREATININE 1.3 05/16/2015 1140   CREATININE 0.94 05/11/2015 0740      Component Value Date/Time   CALCIUM 11.6* 05/16/2015 1140   CALCIUM 10.6* 05/11/2015 0740   ALKPHOS 141 05/16/2015 1140   ALKPHOS 101 05/11/2015 0740   AST 18 05/16/2015 1140   AST 22 05/11/2015 0740   ALT 24 05/16/2015 1140   ALT 25 05/11/2015 0740   BILITOT 0.29 05/16/2015 1140   BILITOT 0.6 05/11/2015 0740       RADIOGRAPHIC STUDIES: Mr Jeri Cos Wo Contrast  06-03-15   CLINICAL DATA:  64 year old male with stage IV  non-small cell lung cancer diagnosed in June. Staging. Subsequent encounter.  EXAM: MRI HEAD WITHOUT AND WITH CONTRAST  TECHNIQUE: Multiplanar, multiecho pulse sequences of the brain and surrounding structures were obtained without and with intravenous contrast.  CONTRAST:  22m MULTIHANCE GADOBENATE DIMEGLUMINE 529 MG/ML IV SOLN  COMPARISON:  PET-CT 04/07/2015.  FINDINGS: There is a small triangular focus of T2 and FLAIR hyperintensity in the right cerebellum (series 6, image 7) associated with curvilinear postcontrast enhancement (series 11, image 12). However, this more resembles a small area of encephalomalacia than a mass. No associated hemosiderin. Diffusion here is facilitated.  No other abnormal enhancement identified.  No dural thickening.  Patchy T2 hyperintensity in the pons. No supratentorial encephalomalacia, with normal for age hemispheric gray and white matter signal.  No restricted diffusion to suggest acute infarction. No intracranial mass effect, ventriculomegaly, extra-axial collection or acute intracranial hemorrhage. Cervicomedullary junction and pituitary are within normal limits. Grossly negative visualized cervical spine.  Visualized bone marrow signal is within normal limits. Negative scalp soft tissues. Orbits soft tissues are normal aside from postoperative changes to the globes. Visible internal auditory structures appear normal. Visualized paranasal sinuses and mastoids are clear.  IMPRESSION: 1. A small 7 mm focus of signal abnormality and enhancement in the right cerebellar hemisphere more resembles sequelae of small vessel ischemia or a small benign vascular malformation than a small metastasis. Recommend repeat study to document stability in 3 months, unless clinically indicated sooner. 2. Otherwise no acute or metastatic intracranial process.   Electronically Signed   By: HGenevie AnnM.D.   On: [id number]    ASSESSMENT AND PLAN: This is a very pleasant 64years old white male  recently diagnosed with a stage IV non-small cell lung cancer, adenocarcinoma with multiple bilateral pulmonary nodules in addition to mediastinal and supraclavicular lymphadenopathy and metastatic bone disease. The MRI of the brain revealed a 7 mm lesion that resembles a couple of small vessel ischemia or a small benign vascular nation rather than a small metastasis. Recommend in a repeat MRI of the brain and 3 months to reevaluate this area. These results were discussed with the patient and his family member/friends. He will proceed with systemic chemotherapy in the form of carboplatin and Alimta.  Patient was discussed with and also seen by Dr. Julien Nordmann. The side effects of chemotherapy including but not limited to nausea vomiting diarrhea or constipation low blood counts were discussed with the patient and he wishes to proceed with chemotherapy. He was given a B-12 injection. A refill prescription for his tramadol was given to the patient and prescription for folic acid and dexamethasone were sent to his pharmacy of record via E scribe. Expectantly first cycle of chemotherapy to be given in 1 week and we will have him follow-up in 2 weeks for symptom management visit. Patient advised to increase his by mouth intake as tolerated. He was advised to call if he has any concerning symptoms in the interval. The patient voices understanding of current disease status and treatment options and is in agreement with the current care plan.  All questions were answered. The patient knows to call the clinic with any problems, questions or concerns. We can certainly see the patient much sooner if necessary.  Mark Hurst, DUDDING, PA-C 05/16/2015  ADDENDUM: Hematology/Oncology Attending: I had a face to face encounter with the patient. I recommended his care plan. This is a very pleasant 64 years old white male with stage IV non-small cell lung cancer, adenocarcinoma. The patient had dental evaluation and treatment  recently. He is here for evaluation and discussion of his systemic chemotherapy options. I had a lengthy discussion with the patient and his family about his condition. I gave him the option of palliative care and hospice referral versus consideration of treatment with carboplatin and Alimta every 3 weeks. He is interested in proceeding with the systemic chemotherapy. I discussed with the patient adverse effects of this treatment including but not limited to alopecia, myelosuppression, nausea and vomiting, peripheral neuropathy, liver or renal dysfunction. We will arrange for the patient to receive vitamin B 12 injection today. The patient would also receive prescription for Compazine 10 mg by mouth every 6 hours as needed for nausea, Decadron 4 mg by mouth twice a day, the day before, day of and day after the chemotherapy in addition to folic acid 1 mg by mouth daily. I will arrange for the patient to have a chemotherapy education class before starting the first dose of the chemotherapy. He is expected to start the first cycle of this treatment next week. The patient would come back for follow-up visit in 2 weeks for reevaluation and management of any adverse effect of his treatment. He was advised to call immediately if he has any concerning symptoms in the interval.  Disclaimer: This note was dictated with voice recognition software. Similar sounding words can inadvertently be transcribed and may be missed upon review. Eilleen Kempf., MD 05/19/2015

## 2015-05-16 NOTE — Telephone Encounter (Signed)
per pof to sch pt appt-gave pt copy of avs-sent MW email to sch trmt-

## 2015-05-18 ENCOUNTER — Emergency Department (HOSPITAL_COMMUNITY)
Admission: EM | Admit: 2015-05-18 | Discharge: 2015-05-18 | Disposition: A | Discharge status: Home / Self Care | Payer: Medicare PPO | Source: Home / Self Care | Attending: Emergency Medicine | Admitting: Emergency Medicine

## 2015-05-18 ENCOUNTER — Encounter (HOSPITAL_COMMUNITY): Payer: Self-pay | Admitting: Emergency Medicine

## 2015-05-18 ENCOUNTER — Telehealth: Payer: Self-pay | Admitting: Medical Oncology

## 2015-05-18 ENCOUNTER — Emergency Department (HOSPITAL_COMMUNITY): Payer: Medicare PPO

## 2015-05-18 DIAGNOSIS — T402X5A Adverse effect of other opioids, initial encounter: Secondary | ICD-10-CM

## 2015-05-18 DIAGNOSIS — A419 Sepsis, unspecified organism: Secondary | ICD-10-CM | POA: Diagnosis not present

## 2015-05-18 DIAGNOSIS — M549 Dorsalgia, unspecified: Secondary | ICD-10-CM | POA: Diagnosis not present

## 2015-05-18 DIAGNOSIS — C7951 Secondary malignant neoplasm of bone: Secondary | ICD-10-CM

## 2015-05-18 DIAGNOSIS — K5903 Drug induced constipation: Secondary | ICD-10-CM

## 2015-05-18 LAB — COMPREHENSIVE METABOLIC PANEL
ALBUMIN: 2.8 g/dL — AB (ref 3.5–5.0)
ALK PHOS: 110 U/L (ref 38–126)
ALT: 25 U/L (ref 17–63)
AST: 24 U/L (ref 15–41)
Anion gap: 12 (ref 5–15)
BILIRUBIN TOTAL: 0.3 mg/dL (ref 0.3–1.2)
BUN: 40 mg/dL — AB (ref 6–20)
CALCIUM: 10.3 mg/dL (ref 8.9–10.3)
CO2: 22 mmol/L (ref 22–32)
Chloride: 94 mmol/L — ABNORMAL LOW (ref 101–111)
Creatinine, Ser: 1.06 mg/dL (ref 0.61–1.24)
GFR calc Af Amer: >60 mL/min (ref >60)
GFR calc non Af Amer: >60 mL/min (ref >60)
GLUCOSE: 301 mg/dL — AB (ref 65–99)
Potassium: 4.8 mmol/L (ref 3.5–5.1)
SODIUM: 128 mmol/L — AB (ref 135–145)
TOTAL PROTEIN: 7.1 g/dL (ref 6.5–8.1)

## 2015-05-18 LAB — CBC WITH DIFFERENTIAL/PLATELET
BASOS ABS: 0 10*3/uL (ref 0.0–0.1)
BASOS PCT: 0 % (ref 0–1)
Eosinophils Absolute: 0.1 10*3/uL (ref 0.0–0.7)
Eosinophils Relative: 1 % (ref 0–5)
HEMATOCRIT: 37.4 % — AB (ref 39.0–52.0)
HEMOGLOBIN: 12.9 g/dL — AB (ref 13.0–17.0)
Lymphocytes Relative: 6 % — ABNORMAL LOW (ref 12–46)
Lymphs Abs: 0.5 10*3/uL — ABNORMAL LOW (ref 0.7–4.0)
MCH: 29.9 pg (ref 26.0–34.0)
MCHC: 34.5 g/dL (ref 30.0–36.0)
MCV: 86.8 fL (ref 78.0–100.0)
MONOS PCT: 12 % (ref 3–12)
Monocytes Absolute: 1 10*3/uL (ref 0.1–1.0)
NEUTROS ABS: 7 10*3/uL (ref 1.7–7.7)
NEUTROS PCT: 81 % — AB (ref 43–77)
Platelets: 312 10*3/uL (ref 150–400)
RBC: 4.31 MIL/uL (ref 4.22–5.81)
RDW: 12.8 % (ref 11.5–15.5)
WBC: 8.6 10*3/uL (ref 4.0–10.5)

## 2015-05-18 MED ORDER — FENTANYL 25 MCG/HR TD PT72: 25.0000 ug | MEDICATED_PATCH | TRANSDERMAL | Status: AC

## 2015-05-18 MED ORDER — LIDOCAINE HCL 2 % EX GEL
1.0000 "application " | Freq: Once | CUTANEOUS | Status: AC
  Administered 2015-05-18: 1 via TOPICAL
  Filled 2015-05-18: qty 10

## 2015-05-18 MED ORDER — FENTANYL CITRATE (PF) 100 MCG/2ML IJ SOLN
100.0000 ug | Freq: Once | INTRAMUSCULAR | Status: AC
  Administered 2015-05-18: 100 ug via INTRAVENOUS
  Filled 2015-05-18: qty 2

## 2015-05-18 MED ORDER — SODIUM CHLORIDE 0.9 % IV BOLUS (SEPSIS)
1000.0000 mL | Freq: Once | INTRAVENOUS | Status: AC
  Administered 2015-05-18: 1000 mL via INTRAVENOUS

## 2015-05-18 NOTE — ED Notes (Signed)
Delay in lab draw, pt in exray

## 2015-05-18 NOTE — ED Notes (Signed)
Patient has been constipated for 4 weeks and had bowel movement. Patient is having severe pain after having bowel movement.

## 2015-05-18 NOTE — Telephone Encounter (Signed)
Reports uncontrolled back pain . Taking hydrocodone and tramadol with some relief. Dr Julien Nordmann prescribed fentanyl patch 25 mg. Pt notified to pick up rx and he will pick it up tomorrow .

## 2015-05-18 NOTE — ED Notes (Signed)
Bed: GL87 Expected date:  Expected time:  Means of arrival:  Comments: EMS [place] pt, back and leg pain

## 2015-05-18 NOTE — ED Provider Notes (Signed)
CSN: [id number]     Arrival date & time 05/18/15  0131 History   First MD Initiated Contact with Patient 05/18/15 0148     Chief Complaint  Patient presents with  . Leg Pain after Bowel Movement      (Consider location/radiation/quality/duration/timing/severity/associated sxs/prior Treatment) HPI  This is a 64 year old male with metastatic lung cancer with multiple bony metastases. He has had a 4 week history of minimal appetite with poor oral intake, usually Ensure. He recently had radiation therapy and had extraction of all of his teeth last week. He has not had a bowel movement for about 4 weeks. He had a bowel movement prior to arrival which he describes as being like "a rock". After expelling disimpacted feces he experienced acute exacerbation of pain in his lower back and hips. His pain was severe has improved since then. He continues to have pain. He feels generally weak.  Past Medical History  Diagnosis Date  . WPW (Wolff-Parkinson-White syndrome)     ablated  . DM (dermatomyositis)     x 10 years  . Hyperlipidemia     x 13 years  . Tobacco abuse   . CAD (coronary artery disease)     1997 LAD 95% stenosis. He had Rotablator of small  couple lesions in this astery. His last stress perfusion study was in 2001 with no evidence of ischemia.  . Iliac artery occlusion, right   . Hypertension     x 13 years  . Anginal pain   . Type II diabetes mellitus     Type 2  . Lung cancer     stage 4 - with mets  . Hepatitis A 1972    "in [employer]"  . Shortness of breath dyspnea    Past Surgical History  Procedure Laterality Date  . Small intestine surgery  ~ 1976    following ingestion of a fish bone  . Fracture surgery    . Colon surgery    . Hip fracture surgery Right 1976?    "have steel plate put in"  . Percutaneous coronary rotoblator intervention (pci-r)  ~ 1998  . Coronary angioplasty  ~ 1998  . Cataract extraction w/ intraocular lens  implant, bilateral Bilateral ~ 2002  .  Radiofrequency ablation      for WPW  . Video bronchoscopy with endobronchial navigation N/A 03/23/2015    Procedure: VIDEO BRONCHOSCOPY WITH ENDOBRONCHIAL NAVIGATION;  Surgeon: Collene Gobble, MD;  Location: [facility] OR;  Service: Thoracic;  Laterality: N/A;  . Multiple extractions with alveoloplasty N/A 05/11/2015    Procedure: Extraction of tooth #'s 2,5,6,7,8,11,12,17, 20,21,22,23,24,25,26,27,29 with alveoloplasty;  Surgeon: Lenn Cal, DDS;  Location: [facility] ORS;  Service: Oral Surgery;  Laterality: N/A;  . Genital modification N/A    Family History  Problem Relation Age of Onset  . Diabetes Mother   . Depression Mother   . Coronary artery disease Father 43  . Lung cancer Father   . Cancer Father   . Colon cancer Mother   . Bladder Cancer Brother   . Esophageal cancer Brother    Social History  Substance Use Topics  . Smoking status: Former Smoker -- 2.00 packs/day for 47 years    Types: Cigarettes    Quit date: 05/02/2015  . Smokeless tobacco: Never Used  . Alcohol Use: 2.4 oz/week    4 Cans of beer, 0 Standard drinks or equivalent per week     Comment: Drinks 2 beers daily- quit 04/2015  Review of Systems  All other systems reviewed and are negative.   Allergies  Codeine and Percocet  Home Medications   Prior to Admission medications   Medication Sig Start Date End Date Taking? Authorizing Provider  aspirin 81 MG tablet Take 81 mg by mouth daily.     Yes Historical Provider, MD  atorvastatin (LIPITOR) 80 MG tablet Take 40 mg by mouth daily at 6 PM.   Yes Historical Provider, MD  bisacodyl (DULCOLAX) 5 MG EC tablet Take 10 mg by mouth daily as needed for mild constipation or moderate constipation.    Yes Historical Provider, MD  Cholecalciferol 2000 UNITS TABS Take 2,000 Units by mouth daily.   Yes Historical Provider, MD  Fish Oil OIL Take 1,000 mg by mouth 2 (two) times daily.    Yes Historical Provider, MD  folic acid (FOLVITE) 1 MG tablet Take 1 tablet (1 mg  total) by mouth daily. 05/16/15  Yes Carlton Adam, PA-C  HYDROcodone-acetaminophen (NORCO) 5-325 MG per tablet Take 1-2 tablets by mouth every 6 (six) hours as needed for moderate pain or severe pain. 05/11/15  Yes Lenn Cal, DDS  insulin regular (NOVOLIN R,HUMULIN R) 100 units/mL injection Inject 20 Units into the skin every morning. Due to not eating patient not taking insulin   Yes Historical Provider, MD  lisinopril (PRINIVIL,ZESTRIL) 40 MG tablet Take 20 mg daily. Patient taking differently: Take 20 mg by mouth daily.  03/23/15  Yes Collene Gobble, MD  loratadine (CLARITIN) 10 MG tablet Take 10 mg by mouth daily.     Yes Historical Provider, MD  metFORMIN (GLUCOPHAGE) 500 MG tablet Take 1,000 mg by mouth 2 (two) times daily with a meal. Not taking due to not eating   Yes Historical Provider, MD  metoprolol (LOPRESSOR) 50 MG tablet Take 25 mg by mouth 2 (two) times daily.    Yes Historical Provider, MD  Multiple Vitamin (MULTIVITAMIN) tablet Take 1 tablet by mouth daily.   Yes Historical Provider, MD  ondansetron (ZOFRAN) 8 MG tablet Take 8 mg by mouth every 8 (eight) hours as needed for nausea or vomiting.    Yes Historical Provider, MD  tamsulosin (FLOMAX) 0.4 MG CAPS capsule Take 0.4 mg by mouth daily.   Yes Historical Provider, MD  traMADol (ULTRAM) 50 MG tablet Take 1 tablet (50 mg total) by mouth every 6 (six) hours as needed. 05/16/15  Yes Adrena E Johnson, PA-C  dexamethasone (DECADRON) 4 MG tablet Take 1 tablet by mouth twice a day, the day before, the day of and day after chemotheapy 05/16/15   Adrena E Johnson, PA-C   BP 170/74 mmHg  Pulse 97  Temp(Src) 98.2 F (36.8 C) (Oral)  Resp 18  SpO2 92%   Physical Exam  General: Well-developed, cachectic male in no acute distress; appearance consistent with age of record HENT: normocephalic; atraumatic Eyes: pupils equal, round and reactive to light; extraocular muscles intact Neck: supple Heart: regular rate and  rhythm Lungs: clear to auscultation bilaterally Abdomen: soft; nondistended; nontender; bowel sounds present Rectal: External hemorrhoids; no formed stool in vault GU: Tanner 5 male, circumcised with evidence of genital modification Back: Pain on movement of lower back Extremities: No deformity; full range of motion; pulses normal; muscle atrophy Neurologic: Awake, alert and oriented; motor function intact in all extremities and symmetric; no facial droop Skin: Pale Psychiatric: Flat affect   ED Course  Procedures (including critical care time)   MDM  Nursing notes and vitals signs,  including pulse oximetry, reviewed.  Summary of this visit's results, reviewed by myself:  Labs:  Results for orders placed or performed during the hospital encounter of 05/18/15 (from the past 24 hour(s))  CBC with Differential/Platelet     Status: Abnormal   Collection Time: 05/18/15  2:40 AM  Result Value Ref Range   WBC 8.6 4.0 - 10.5 K/uL   RBC 4.31 4.22 - 5.81 MIL/uL   Hemoglobin 12.9 (L) 13.0 - 17.0 g/dL   HCT 37.4 (L) 39.0 - 52.0 %   MCV 86.8 78.0 - 100.0 fL   MCH 29.9 26.0 - 34.0 pg   MCHC 34.5 30.0 - 36.0 g/dL   RDW 12.8 11.5 - 15.5 %   Platelets 312 150 - 400 K/uL   Neutrophils Relative % 81 (H) 43 - 77 %   Neutro Abs 7.0 1.7 - 7.7 K/uL   Lymphocytes Relative 6 (L) 12 - 46 %   Lymphs Abs 0.5 (L) 0.7 - 4.0 K/uL   Monocytes Relative 12 3 - 12 %   Monocytes Absolute 1.0 0.1 - 1.0 K/uL   Eosinophils Relative 1 0 - 5 %   Eosinophils Absolute 0.1 0.0 - 0.7 K/uL   Basophils Relative 0 0 - 1 %   Basophils Absolute 0.0 0.0 - 0.1 K/uL  Comprehensive metabolic panel     Status: Abnormal   Collection Time: 05/18/15  2:40 AM  Result Value Ref Range   Sodium 128 (L) 135 - 145 mmol/L   Potassium 4.8 3.5 - 5.1 mmol/L   Chloride 94 (L) 101 - 111 mmol/L   CO2 22 22 - 32 mmol/L   Glucose, Bld 301 (H) 65 - 99 mg/dL   BUN 40 (H) 6 - 20 mg/dL   Creatinine, Ser 1.06 0.61 - 1.24 mg/dL   Calcium  10.3 8.9 - 10.3 mg/dL   Total Protein 7.1 6.5 - 8.1 g/dL   Albumin 2.8 (L) 3.5 - 5.0 g/dL   AST 24 15 - 41 U/L   ALT 25 17 - 63 U/L   Alkaline Phosphatase 110 38 - 126 U/L   Total Bilirubin 0.3 0.3 - 1.2 mg/dL   GFR calc non Af Amer >60 >60 mL/min   GFR calc Af Amer >60 >60 mL/min   Anion gap 12 5 - 15    Imaging Studies: Ct Lumbar Spine Wo Contrast  05/18/2015   CLINICAL DATA:  Low back pain.  Constipation.  EXAM: CT LUMBAR SPINE WITHOUT CONTRAST  TECHNIQUE: Multidetector CT imaging of the lumbar spine was performed without intravenous contrast administration. Multiplanar CT image reconstructions were also generated.  COMPARISON:  PET-CT 04/07/2015  FINDINGS: On scout imaging there is constipation with stool distending the entire colon.  Lumbar spine:  No acute fracture, endplate erosion, or traumatic malalignment. Bone metastases are not appreciated, including a large deposit in the L2 body seen on previous PET-CT.  Right L5 pars defect without slip.  Mild disc bulging at L4-5 and L5-S1 without suspected impingement. Mild moderate left facet arthropathy at L4-5 and L5-S1. No gross canal hematoma.  Small right pleural effusion. Right adrenal thickening, likely metastatic deposit based on previous PET-CT. Abdominal aortic aneurysm with a 34 mm maximal diameter. Attenuated appearance of the right common and external iliac arteries consistent with chronic occlusion.  Pelvis: Status post open reduction internal fixation of the proximal right femur. There is no evidence of periprosthetic fracture or hardware loosening. No acute fracture or diastasis. Metastases within the right pelvis on prior  PET-CT are not visible by CT.  Mild osteoarthritic spurring about the sacroiliac joints. No notable hip degenerative change.  Sigmoid diverticulosis.  IMPRESSION: 1. No acute finding in the lumbar spine or pelvis. 2. Osseous metastases seen on PET-CT 04/07/2015 are not visible by CT. 3. Large stool volume. 4. Right  pleural effusion is small but increased from July 2016.   Electronically Signed   By: Monte Fantasia M.D.   On: 05/18/2015 02:52   Ct Pelvis Wo Contrast  05/18/2015   CLINICAL DATA:  Low back pain.  Constipation.  EXAM: CT LUMBAR SPINE WITHOUT CONTRAST  TECHNIQUE: Multidetector CT imaging of the lumbar spine was performed without intravenous contrast administration. Multiplanar CT image reconstructions were also generated.  COMPARISON:  PET-CT 04/07/2015  FINDINGS: On scout imaging there is constipation with stool distending the entire colon.  Lumbar spine:  No acute fracture, endplate erosion, or traumatic malalignment. Bone metastases are not appreciated, including a large deposit in the L2 body seen on previous PET-CT.  Right L5 pars defect without slip.  Mild disc bulging at L4-5 and L5-S1 without suspected impingement. Mild moderate left facet arthropathy at L4-5 and L5-S1. No gross canal hematoma.  Small right pleural effusion. Right adrenal thickening, likely metastatic deposit based on previous PET-CT. Abdominal aortic aneurysm with a 34 mm maximal diameter. Attenuated appearance of the right common and external iliac arteries consistent with chronic occlusion.  Pelvis: Status post open reduction internal fixation of the proximal right femur. There is no evidence of periprosthetic fracture or hardware loosening. No acute fracture or diastasis. Metastases within the right pelvis on prior PET-CT are not visible by CT.  Mild osteoarthritic spurring about the sacroiliac joints. No notable hip degenerative change.  Sigmoid diverticulosis.  IMPRESSION: 1. No acute finding in the lumbar spine or pelvis. 2. Osseous metastases seen on PET-CT 04/07/2015 are not visible by CT. 3. Large stool volume. 4. Right pleural effusion is small but increased from July 2016.   Electronically Signed   By: Monte Fantasia M.D.   On: 05/18/2015 02:52   3:34 AM Patient comfortable after IV fentanyl. He was advised that MiraLAX  may be a better choice for him than local lacks given persistent large stool volume. He is scheduled to start chemotherapy soon.    Shanon Rosser, MD 05/18/15 [phone]

## 2015-05-19 ENCOUNTER — Ambulatory Visit (HOSPITAL_COMMUNITY)
Admission: RE | Admit: 2015-05-19 | Discharge: 2015-05-19 | Disposition: A | Discharge status: Home / Self Care | Payer: Medicare PPO | Source: Ambulatory Visit | Attending: Nurse Practitioner | Admitting: Nurse Practitioner

## 2015-05-19 ENCOUNTER — Other Ambulatory Visit: Payer: Self-pay | Admitting: Nurse Practitioner

## 2015-05-19 ENCOUNTER — Other Ambulatory Visit: Payer: Medicare PPO

## 2015-05-19 ENCOUNTER — Other Ambulatory Visit: Payer: Self-pay | Admitting: *Deleted

## 2015-05-19 ENCOUNTER — Ambulatory Visit (HOSPITAL_BASED_OUTPATIENT_CLINIC_OR_DEPARTMENT_OTHER): Payer: Medicare PPO | Admitting: Nurse Practitioner

## 2015-05-19 ENCOUNTER — Telehealth: Payer: Self-pay | Admitting: *Deleted

## 2015-05-19 VITALS — BP 167/70 | HR 87 | Resp 18

## 2015-05-19 VITALS — BP 158/76 | HR 94 | Resp 20

## 2015-05-19 DIAGNOSIS — C7951 Secondary malignant neoplasm of bone: Secondary | ICD-10-CM | POA: Diagnosis not present

## 2015-05-19 DIAGNOSIS — K5900 Constipation, unspecified: Secondary | ICD-10-CM | POA: Diagnosis not present

## 2015-05-19 DIAGNOSIS — G893 Neoplasm related pain (acute) (chronic): Secondary | ICD-10-CM

## 2015-05-19 DIAGNOSIS — C3490 Malignant neoplasm of unspecified part of unspecified bronchus or lung: Secondary | ICD-10-CM

## 2015-05-19 DIAGNOSIS — C3411 Malignant neoplasm of upper lobe, right bronchus or lung: Secondary | ICD-10-CM | POA: Diagnosis not present

## 2015-05-19 DIAGNOSIS — E860 Dehydration: Secondary | ICD-10-CM | POA: Diagnosis not present

## 2015-05-19 MED ORDER — MORPHINE SULFATE 4 MG/ML IJ SOLN
2.0000 mg | Freq: Once | INTRAMUSCULAR | Status: AC
  Administered 2015-05-19: 2 mg via INTRAVENOUS
  Filled 2015-05-19 (×2): qty 1

## 2015-05-19 MED ORDER — SODIUM CHLORIDE 0.9 % IV SOLN
INTRAVENOUS | Status: AC
  Administered 2015-05-19: 13:00:00 via INTRAVENOUS

## 2015-05-19 MED ORDER — HYDROCODONE-ACETAMINOPHEN 5-325 MG PO TABS: 1.0000 | ORAL_TABLET | Freq: Four times a day (QID) | ORAL | Status: DC | PRN

## 2015-05-19 NOTE — Telephone Encounter (Signed)
Oncology Nurse Navigator Documentation  Oncology Nurse Navigator Flowsheets 05/19/2015  Navigator Encounter Type Telephone/I spoke with patient's friend today, Rex at [facility].  He states patient is requesting to add people to his HIPPA form here at [facility].  I called patient to inquire.  Rex answered the phone due to him getting "fluids" right now.  I instructed him that when patient comes for a visit at [facility] he could fill out form at that time.    Treatment Phase Treatment  Interventions Coordination of Care  Time Spent with Patient 15

## 2015-05-19 NOTE — Procedures (Signed)
Associated diagnosis: malignant neoplasm of lung, unspecified unilaterality, unspecified part of lund C. 34.90 MD: Berniece Salines  Procedure Note: Infusion 1L normal saline Condition during procedure: Tolerated well Condition after procedure: Alert, oriented, and accompanied by companion.

## 2015-05-20 ENCOUNTER — Encounter: Payer: Self-pay | Admitting: *Deleted

## 2015-05-20 NOTE — Progress Notes (Signed)
Oncology Nurse Navigator Documentation  Oncology Nurse Navigator Flowsheets 05/20/2015  Navigator Encounter Type Other/Looked up [place] [facility] results, place on Dr. Worthy Flank desk  Treatment Phase -  Interventions Coordination of Care  Time Spent with Patient 15

## 2015-05-21 ENCOUNTER — Emergency Department (HOSPITAL_COMMUNITY): Payer: Medicare PPO

## 2015-05-21 ENCOUNTER — Inpatient Hospital Stay (HOSPITAL_BASED_OUTPATIENT_CLINIC_OR_DEPARTMENT_OTHER): Payer: Medicare PPO

## 2015-05-21 ENCOUNTER — Inpatient Hospital Stay (HOSPITAL_COMMUNITY): Payer: Medicare PPO | Admitting: Anesthesiology

## 2015-05-21 ENCOUNTER — Encounter (HOSPITAL_COMMUNITY): Payer: Self-pay

## 2015-05-21 ENCOUNTER — Encounter (HOSPITAL_COMMUNITY)
Admission: EM | Disposition: A | Discharge status: Hospice | Payer: Self-pay | Source: Home / Self Care | Attending: Internal Medicine

## 2015-05-21 ENCOUNTER — Inpatient Hospital Stay (HOSPITAL_COMMUNITY)
Admission: EM | Admit: 2015-05-21 | Discharge: 2015-05-26 | DRG: 853 | Disposition: A | Discharge status: Hospice | Payer: Medicare PPO | Attending: Internal Medicine | Admitting: Internal Medicine

## 2015-05-21 ENCOUNTER — Encounter (HOSPITAL_COMMUNITY): Payer: Self-pay | Admitting: Emergency Medicine

## 2015-05-21 ENCOUNTER — Inpatient Hospital Stay (HOSPITAL_COMMUNITY): Payer: Medicare PPO

## 2015-05-21 DIAGNOSIS — Z8249 Family history of ischemic heart disease and other diseases of the circulatory system: Secondary | ICD-10-CM

## 2015-05-21 DIAGNOSIS — Z8052 Family history of malignant neoplasm of bladder: Secondary | ICD-10-CM | POA: Diagnosis not present

## 2015-05-21 DIAGNOSIS — C7951 Secondary malignant neoplasm of bone: Secondary | ICD-10-CM | POA: Diagnosis present

## 2015-05-21 DIAGNOSIS — J9601 Acute respiratory failure with hypoxia: Secondary | ICD-10-CM | POA: Diagnosis present

## 2015-05-21 DIAGNOSIS — C781 Secondary malignant neoplasm of mediastinum: Secondary | ICD-10-CM | POA: Diagnosis present

## 2015-05-21 DIAGNOSIS — C7989 Secondary malignant neoplasm of other specified sites: Secondary | ICD-10-CM

## 2015-05-21 DIAGNOSIS — Z515 Encounter for palliative care: Secondary | ICD-10-CM | POA: Diagnosis not present

## 2015-05-21 DIAGNOSIS — I456 Pre-excitation syndrome: Secondary | ICD-10-CM | POA: Diagnosis present

## 2015-05-21 DIAGNOSIS — Z961 Presence of intraocular lens: Secondary | ICD-10-CM | POA: Diagnosis present

## 2015-05-21 DIAGNOSIS — J189 Pneumonia, unspecified organism: Secondary | ICD-10-CM | POA: Diagnosis not present

## 2015-05-21 DIAGNOSIS — Z720 Tobacco use: Secondary | ICD-10-CM | POA: Diagnosis present

## 2015-05-21 DIAGNOSIS — I25119 Atherosclerotic heart disease of native coronary artery with unspecified angina pectoris: Secondary | ICD-10-CM | POA: Diagnosis present

## 2015-05-21 DIAGNOSIS — E440 Moderate protein-calorie malnutrition: Secondary | ICD-10-CM | POA: Diagnosis present

## 2015-05-21 DIAGNOSIS — Y95 Nosocomial condition: Secondary | ICD-10-CM | POA: Diagnosis present

## 2015-05-21 DIAGNOSIS — M314 Aortic arch syndrome [Takayasu]: Secondary | ICD-10-CM | POA: Diagnosis not present

## 2015-05-21 DIAGNOSIS — M549 Dorsalgia, unspecified: Secondary | ICD-10-CM | POA: Diagnosis present

## 2015-05-21 DIAGNOSIS — I480 Paroxysmal atrial fibrillation: Secondary | ICD-10-CM | POA: Diagnosis present

## 2015-05-21 DIAGNOSIS — I745 Embolism and thrombosis of iliac artery: Secondary | ICD-10-CM | POA: Diagnosis not present

## 2015-05-21 DIAGNOSIS — D6859 Other primary thrombophilia: Secondary | ICD-10-CM | POA: Diagnosis present

## 2015-05-21 DIAGNOSIS — F17210 Nicotine dependence, cigarettes, uncomplicated: Secondary | ICD-10-CM | POA: Diagnosis present

## 2015-05-21 DIAGNOSIS — Z7982 Long term (current) use of aspirin: Secondary | ICD-10-CM

## 2015-05-21 DIAGNOSIS — I10 Essential (primary) hypertension: Secondary | ICD-10-CM | POA: Diagnosis present

## 2015-05-21 DIAGNOSIS — I4891 Unspecified atrial fibrillation: Secondary | ICD-10-CM | POA: Diagnosis not present

## 2015-05-21 DIAGNOSIS — C3490 Malignant neoplasm of unspecified part of unspecified bronchus or lung: Secondary | ICD-10-CM | POA: Diagnosis present

## 2015-05-21 DIAGNOSIS — R6889 Other general symptoms and signs: Secondary | ICD-10-CM | POA: Diagnosis present

## 2015-05-21 DIAGNOSIS — I998 Other disorder of circulatory system: Secondary | ICD-10-CM | POA: Diagnosis not present

## 2015-05-21 DIAGNOSIS — R000 Tachycardia, unspecified: Secondary | ICD-10-CM

## 2015-05-21 DIAGNOSIS — R209 Unspecified disturbances of skin sensation: Secondary | ICD-10-CM | POA: Diagnosis present

## 2015-05-21 DIAGNOSIS — E1151 Type 2 diabetes mellitus with diabetic peripheral angiopathy without gangrene: Secondary | ICD-10-CM | POA: Diagnosis present

## 2015-05-21 DIAGNOSIS — Z794 Long term (current) use of insulin: Secondary | ICD-10-CM

## 2015-05-21 DIAGNOSIS — E780 Pure hypercholesterolemia: Secondary | ICD-10-CM | POA: Diagnosis present

## 2015-05-21 DIAGNOSIS — Z79899 Other long term (current) drug therapy: Secondary | ICD-10-CM

## 2015-05-21 DIAGNOSIS — E785 Hyperlipidemia, unspecified: Secondary | ICD-10-CM | POA: Diagnosis present

## 2015-05-21 DIAGNOSIS — R0602 Shortness of breath: Secondary | ICD-10-CM

## 2015-05-21 DIAGNOSIS — Z9841 Cataract extraction status, right eye: Secondary | ICD-10-CM | POA: Diagnosis not present

## 2015-05-21 DIAGNOSIS — Z885 Allergy status to narcotic agent status: Secondary | ICD-10-CM

## 2015-05-21 DIAGNOSIS — A419 Sepsis, unspecified organism: Secondary | ICD-10-CM | POA: Diagnosis not present

## 2015-05-21 DIAGNOSIS — Z9889 Other specified postprocedural states: Secondary | ICD-10-CM | POA: Diagnosis not present

## 2015-05-21 DIAGNOSIS — Z66 Do not resuscitate: Secondary | ICD-10-CM | POA: Diagnosis present

## 2015-05-21 DIAGNOSIS — I248 Other forms of acute ischemic heart disease: Secondary | ICD-10-CM | POA: Diagnosis present

## 2015-05-21 DIAGNOSIS — J449 Chronic obstructive pulmonary disease, unspecified: Secondary | ICD-10-CM | POA: Diagnosis present

## 2015-05-21 DIAGNOSIS — I749 Embolism and thrombosis of unspecified artery: Secondary | ICD-10-CM | POA: Diagnosis not present

## 2015-05-21 DIAGNOSIS — Z800 Family history of malignant neoplasm of digestive organs: Secondary | ICD-10-CM | POA: Diagnosis not present

## 2015-05-21 DIAGNOSIS — Z833 Family history of diabetes mellitus: Secondary | ICD-10-CM | POA: Diagnosis not present

## 2015-05-21 DIAGNOSIS — Z801 Family history of malignant neoplasm of trachea, bronchus and lung: Secondary | ICD-10-CM | POA: Diagnosis not present

## 2015-05-21 DIAGNOSIS — Z9842 Cataract extraction status, left eye: Secondary | ICD-10-CM | POA: Diagnosis not present

## 2015-05-21 DIAGNOSIS — Z419 Encounter for procedure for purposes other than remedying health state, unspecified: Secondary | ICD-10-CM

## 2015-05-21 DIAGNOSIS — M79606 Pain in leg, unspecified: Secondary | ICD-10-CM

## 2015-05-21 DIAGNOSIS — R410 Disorientation, unspecified: Secondary | ICD-10-CM

## 2015-05-21 DIAGNOSIS — I25118 Atherosclerotic heart disease of native coronary artery with other forms of angina pectoris: Secondary | ICD-10-CM | POA: Diagnosis not present

## 2015-05-21 DIAGNOSIS — I2510 Atherosclerotic heart disease of native coronary artery without angina pectoris: Secondary | ICD-10-CM | POA: Diagnosis present

## 2015-05-21 DIAGNOSIS — I7090 Unspecified atherosclerosis: Secondary | ICD-10-CM | POA: Diagnosis present

## 2015-05-21 HISTORY — DX: Unspecified atrial fibrillation: I48.91

## 2015-05-21 HISTORY — PX: EMBOLECTOMY: SHX44

## 2015-05-21 LAB — ABO/RH: ABO/RH(D): O POS

## 2015-05-21 LAB — COMPREHENSIVE METABOLIC PANEL
ALK PHOS: 112 U/L (ref 38–126)
ALT: 22 U/L (ref 17–63)
AST: 24 U/L (ref 15–41)
Albumin: 3 g/dL — ABNORMAL LOW (ref 3.5–5.0)
Anion gap: 10 (ref 5–15)
BILIRUBIN TOTAL: 0.3 mg/dL (ref 0.3–1.2)
BUN: 30 mg/dL — ABNORMAL HIGH (ref 6–20)
CALCIUM: 9.9 mg/dL (ref 8.9–10.3)
CHLORIDE: 94 mmol/L — AB (ref 101–111)
CO2: 24 mmol/L (ref 22–32)
CREATININE: 0.79 mg/dL (ref 0.61–1.24)
Glucose, Bld: 242 mg/dL — ABNORMAL HIGH (ref 65–99)
Potassium: 4.7 mmol/L (ref 3.5–5.1)
Sodium: 128 mmol/L — ABNORMAL LOW (ref 135–145)
TOTAL PROTEIN: 7.3 g/dL (ref 6.5–8.1)

## 2015-05-21 LAB — I-STAT CHEM 8, ED
BUN: 29 mg/dL — ABNORMAL HIGH (ref 6–20)
Calcium, Ion: 1.25 mmol/L (ref 1.13–1.30)
Chloride: 97 mmol/L — ABNORMAL LOW (ref 101–111)
Creatinine, Ser: 0.9 mg/dL (ref 0.61–1.24)
Glucose, Bld: 243 mg/dL — ABNORMAL HIGH (ref 65–99)
HEMATOCRIT: 43 % (ref 39.0–52.0)
HEMOGLOBIN: 14.6 g/dL (ref 13.0–17.0)
Potassium: 4.7 mmol/L (ref 3.5–5.1)
SODIUM: 127 mmol/L — AB (ref 135–145)
TCO2: 23 mmol/L (ref 0–100)

## 2015-05-21 LAB — URINALYSIS, ROUTINE W REFLEX MICROSCOPIC
BILIRUBIN URINE: NEGATIVE
Glucose, UA: 100 mg/dL — AB
Hgb urine dipstick: NEGATIVE
KETONES UR: NEGATIVE mg/dL
Leukocytes, UA: NEGATIVE
NITRITE: NEGATIVE
Protein, ur: NEGATIVE mg/dL
Specific Gravity, Urine: 1.028 (ref 1.005–1.030)
UROBILINOGEN UA: 1 mg/dL (ref 0.0–1.0)
pH: 5.5 (ref 5.0–8.0)

## 2015-05-21 LAB — CBC WITH DIFFERENTIAL/PLATELET
Basophils Absolute: 0 10*3/uL (ref 0.0–0.1)
Basophils Relative: 0 % (ref 0–1)
EOS PCT: 1 % (ref 0–5)
Eosinophils Absolute: 0.1 10*3/uL (ref 0.0–0.7)
HCT: 39.1 % (ref 39.0–52.0)
Hemoglobin: 13.3 g/dL (ref 13.0–17.0)
LYMPHS ABS: 0.8 10*3/uL (ref 0.7–4.0)
LYMPHS PCT: 9 % — AB (ref 12–46)
MCH: 29.6 pg (ref 26.0–34.0)
MCHC: 34 g/dL (ref 30.0–36.0)
MCV: 86.9 fL (ref 78.0–100.0)
Monocytes Absolute: 0.7 10*3/uL (ref 0.1–1.0)
Monocytes Relative: 8 % (ref 3–12)
Neutro Abs: 7 10*3/uL (ref 1.7–7.7)
Neutrophils Relative %: 82 % — ABNORMAL HIGH (ref 43–77)
PLATELETS: 301 10*3/uL (ref 150–400)
RBC: 4.5 MIL/uL (ref 4.22–5.81)
RDW: 13 % (ref 11.5–15.5)
WBC: 8.5 10*3/uL (ref 4.0–10.5)

## 2015-05-21 LAB — I-STAT CG4 LACTIC ACID, ED
LACTIC ACID, VENOUS: 1.9 mmol/L (ref 0.5–2.0)
Lactic Acid, Venous: 2.33 mmol/L (ref 0.5–2.0)

## 2015-05-21 LAB — LACTIC ACID, PLASMA: Lactic Acid, Venous: 1.8 mmol/L (ref 0.5–2.0)

## 2015-05-21 LAB — GLUCOSE, CAPILLARY
GLUCOSE-CAPILLARY: 165 mg/dL — AB (ref 65–99)
Glucose-Capillary: 135 mg/dL — ABNORMAL HIGH (ref 65–99)
Glucose-Capillary: 162 mg/dL — ABNORMAL HIGH (ref 65–99)

## 2015-05-21 LAB — CBC
HCT: 33 % — ABNORMAL LOW (ref 39.0–52.0)
HEMOGLOBIN: 11.3 g/dL — AB (ref 13.0–17.0)
MCH: 29.5 pg (ref 26.0–34.0)
MCHC: 34.2 g/dL (ref 30.0–36.0)
MCV: 86.2 fL (ref 78.0–100.0)
Platelets: 226 10*3/uL (ref 150–400)
RBC: 3.83 MIL/uL — AB (ref 4.22–5.81)
RDW: 13 % (ref 11.5–15.5)
WBC: 6 10*3/uL (ref 4.0–10.5)

## 2015-05-21 LAB — PREPARE RBC (CROSSMATCH)

## 2015-05-21 LAB — CREATININE, SERUM: CREATININE: 0.9 mg/dL (ref 0.61–1.24)

## 2015-05-21 LAB — PROCALCITONIN: PROCALCITONIN: 0.22 ng/mL

## 2015-05-21 LAB — CBG MONITORING, ED
GLUCOSE-CAPILLARY: 256 mg/dL — AB (ref 65–99)
Glucose-Capillary: 150 mg/dL — ABNORMAL HIGH (ref 65–99)

## 2015-05-21 LAB — MRSA PCR SCREENING: MRSA by PCR: NEGATIVE

## 2015-05-21 LAB — STREP PNEUMONIAE URINARY ANTIGEN: Strep Pneumo Urinary Antigen: NEGATIVE

## 2015-05-21 SURGERY — EMBOLECTOMY
Anesthesia: General | Laterality: Bilateral

## 2015-05-21 MED ORDER — FENTANYL CITRATE (PF) 250 MCG/5ML IJ SOLN
INTRAMUSCULAR | Status: AC
  Filled 2015-05-21: qty 5

## 2015-05-21 MED ORDER — ALUM & MAG HYDROXIDE-SIMETH 200-200-20 MG/5ML PO SUSP: 15.0000 mL | ORAL | Status: DC | PRN

## 2015-05-21 MED ORDER — SODIUM CHLORIDE 0.9 % IV SOLN
Freq: Once | INTRAVENOUS | Status: AC
  Administered 2015-05-21: 08:00:00 via INTRAVENOUS

## 2015-05-21 MED ORDER — HEPARIN SODIUM (PORCINE) 1000 UNIT/ML IJ SOLN
INTRAMUSCULAR | Status: DC | PRN
  Administered 2015-05-21: 2000 [IU] via INTRAVENOUS
  Administered 2015-05-21: 5000 [IU] via INTRAVENOUS

## 2015-05-21 MED ORDER — ROCURONIUM BROMIDE 100 MG/10ML IV SOLN
INTRAVENOUS | Status: DC | PRN
  Administered 2015-05-21 (×2): 20 mg via INTRAVENOUS
  Administered 2015-05-21: 10 mg via INTRAVENOUS

## 2015-05-21 MED ORDER — SUCCINYLCHOLINE CHLORIDE 20 MG/ML IJ SOLN
INTRAMUSCULAR | Status: DC | PRN
  Administered 2015-05-21: 120 mg via INTRAVENOUS

## 2015-05-21 MED ORDER — ASPIRIN 81 MG PO CHEW
81.0000 mg | CHEWABLE_TABLET | Freq: Every day | ORAL | Status: DC
  Administered 2015-05-22 – 2015-05-24 (×3): 81 mg via ORAL
  Filled 2015-05-21 (×3): qty 1

## 2015-05-21 MED ORDER — GUAIFENESIN-DM 100-10 MG/5ML PO SYRP
15.0000 mL | ORAL_SOLUTION | ORAL | Status: DC | PRN
  Administered 2015-05-25: 15 mL via ORAL
  Filled 2015-05-21 (×2): qty 15

## 2015-05-21 MED ORDER — ONDANSETRON HCL 4 MG/2ML IJ SOLN
4.0000 mg | Freq: Four times a day (QID) | INTRAMUSCULAR | Status: DC | PRN
  Administered 2015-05-21: 4 mg via INTRAVENOUS
  Filled 2015-05-21: qty 2

## 2015-05-21 MED ORDER — TAMSULOSIN HCL 0.4 MG PO CAPS
0.4000 mg | ORAL_CAPSULE | Freq: Every day | ORAL | Status: DC
  Administered 2015-05-22 – 2015-05-25 (×4): 0.4 mg via ORAL
  Filled 2015-05-21 (×6): qty 1

## 2015-05-21 MED ORDER — INSULIN ASPART 100 UNIT/ML ~~LOC~~ SOLN: 0.0000 [IU] | Freq: Every day | SUBCUTANEOUS | Status: DC

## 2015-05-21 MED ORDER — ATORVASTATIN CALCIUM 40 MG PO TABS
40.0000 mg | ORAL_TABLET | Freq: Every day | ORAL | Status: DC
  Administered 2015-05-21 – 2015-05-24 (×2): 40 mg via ORAL
  Filled 2015-05-21 (×6): qty 1

## 2015-05-21 MED ORDER — CALCIUM CHLORIDE 10 % IV SOLN
INTRAVENOUS | Status: DC | PRN
  Administered 2015-05-21: 200 mg via INTRAVENOUS

## 2015-05-21 MED ORDER — DEXTROSE 5 % IV SOLN
1.0000 g | INTRAVENOUS | Status: DC
  Administered 2015-05-21: 1 g via INTRAVENOUS
  Filled 2015-05-21 (×2): qty 10

## 2015-05-21 MED ORDER — SODIUM CHLORIDE 0.9 % IV SOLN
Freq: Once | INTRAVENOUS | Status: AC
  Administered 2015-05-21: 15:00:00 via INTRAVENOUS

## 2015-05-21 MED ORDER — INSULIN ASPART 100 UNIT/ML ~~LOC~~ SOLN
0.0000 [IU] | Freq: Three times a day (TID) | SUBCUTANEOUS | Status: DC
  Administered 2015-05-22: 2 [IU] via SUBCUTANEOUS
  Administered 2015-05-22: 1 [IU] via SUBCUTANEOUS
  Administered 2015-05-22 – 2015-05-23 (×2): 2 [IU] via SUBCUTANEOUS
  Administered 2015-05-23 (×2): 1 [IU] via SUBCUTANEOUS
  Administered 2015-05-24: 2 [IU] via SUBCUTANEOUS
  Administered 2015-05-24 (×2): 1 [IU] via SUBCUTANEOUS
  Administered 2015-05-25: 2 [IU] via SUBCUTANEOUS
  Administered 2015-05-25: 1 [IU] via SUBCUTANEOUS

## 2015-05-21 MED ORDER — ENOXAPARIN SODIUM 40 MG/0.4ML ~~LOC~~ SOLN
40.0000 mg | SUBCUTANEOUS | Status: DC
  Administered 2015-05-22: 40 mg via SUBCUTANEOUS
  Filled 2015-05-21 (×2): qty 0.4

## 2015-05-21 MED ORDER — POTASSIUM CHLORIDE CRYS ER 20 MEQ PO TBCR: 20.0000 meq | EXTENDED_RELEASE_TABLET | Freq: Every day | ORAL | Status: DC | PRN

## 2015-05-21 MED ORDER — PANTOPRAZOLE SODIUM 40 MG PO TBEC
40.0000 mg | DELAYED_RELEASE_TABLET | Freq: Every day | ORAL | Status: DC
  Administered 2015-05-21 – 2015-05-24 (×4): 40 mg via ORAL
  Filled 2015-05-21 (×5): qty 1

## 2015-05-21 MED ORDER — HYDRALAZINE HCL 20 MG/ML IJ SOLN: 5.0000 mg | INTRAMUSCULAR | Status: DC | PRN

## 2015-05-21 MED ORDER — LEVALBUTEROL HCL 1.25 MG/0.5ML IN NEBU
1.2500 mg | INHALATION_SOLUTION | Freq: Four times a day (QID) | RESPIRATORY_TRACT | Status: DC | PRN
  Administered 2015-05-22: 1.25 mg via RESPIRATORY_TRACT
  Filled 2015-05-21 (×2): qty 0.5

## 2015-05-21 MED ORDER — LABETALOL HCL 5 MG/ML IV SOLN
INTRAVENOUS | Status: AC
  Filled 2015-05-21: qty 4

## 2015-05-21 MED ORDER — ENOXAPARIN SODIUM 40 MG/0.4ML ~~LOC~~ SOLN
40.0000 mg | SUBCUTANEOUS | Status: DC
  Filled 2015-05-21: qty 0.4

## 2015-05-21 MED ORDER — METOPROLOL TARTRATE 25 MG PO TABS
25.0000 mg | ORAL_TABLET | Freq: Two times a day (BID) | ORAL | Status: DC
  Administered 2015-05-21: 25 mg via ORAL
  Filled 2015-05-21 (×4): qty 1

## 2015-05-21 MED ORDER — MIDAZOLAM HCL 2 MG/2ML IJ SOLN
INTRAMUSCULAR | Status: AC
  Filled 2015-05-21: qty 4

## 2015-05-21 MED ORDER — SODIUM CHLORIDE 0.9 % IV SOLN
Freq: Once | INTRAVENOUS | Status: AC
  Administered 2015-05-21 (×2): via INTRAVENOUS

## 2015-05-21 MED ORDER — PHENYLEPHRINE HCL 10 MG/ML IJ SOLN
INTRAMUSCULAR | Status: DC | PRN
  Administered 2015-05-21: 200 ug via INTRAVENOUS

## 2015-05-21 MED ORDER — PROTAMINE SULFATE 10 MG/ML IV SOLN
INTRAVENOUS | Status: DC | PRN
  Administered 2015-05-21: 30 mg via INTRAVENOUS

## 2015-05-21 MED ORDER — LACTATED RINGERS IV SOLN: INTRAVENOUS | Status: DC

## 2015-05-21 MED ORDER — SODIUM CHLORIDE 0.9 % IV SOLN
500.0000 mL | Freq: Once | INTRAVENOUS | Status: DC | PRN
Start: 2015-05-21 — End: 2015-05-22

## 2015-05-21 MED ORDER — ONDANSETRON HCL 4 MG PO TABS: 4.0000 mg | ORAL_TABLET | Freq: Four times a day (QID) | ORAL | Status: DC | PRN

## 2015-05-21 MED ORDER — MEPERIDINE HCL 25 MG/ML IJ SOLN: 6.2500 mg | INTRAMUSCULAR | Status: DC | PRN

## 2015-05-21 MED ORDER — ENSURE PO LIQD: 237.0000 mL | Freq: Two times a day (BID) | ORAL | Status: DC

## 2015-05-21 MED ORDER — LIDOCAINE HCL (CARDIAC) 20 MG/ML IV SOLN
INTRAVENOUS | Status: DC | PRN
  Administered 2015-05-21: 80 mg via INTRAVENOUS

## 2015-05-21 MED ORDER — SUGAMMADEX SODIUM 200 MG/2ML IV SOLN
INTRAVENOUS | Status: AC
  Filled 2015-05-21: qty 2

## 2015-05-21 MED ORDER — FENTANYL CITRATE (PF) 100 MCG/2ML IJ SOLN
100.0000 ug | Freq: Once | INTRAMUSCULAR | Status: AC
  Administered 2015-05-21: 100 ug via INTRAVENOUS
  Filled 2015-05-21: qty 2

## 2015-05-21 MED ORDER — LISINOPRIL 10 MG PO TABS
10.0000 mg | ORAL_TABLET | Freq: Every day | ORAL | Status: DC
  Administered 2015-05-22 – 2015-05-23 (×2): 10 mg via ORAL
  Filled 2015-05-21 (×3): qty 1

## 2015-05-21 MED ORDER — HYDROMORPHONE HCL 1 MG/ML IJ SOLN
0.5000 mg | INTRAMUSCULAR | Status: DC | PRN
  Administered 2015-05-21: 0.5 mg via INTRAVENOUS
  Filled 2015-05-21 (×2): qty 1

## 2015-05-21 MED ORDER — IOHEXOL 300 MG/ML  SOLN
INTRAMUSCULAR | Status: DC | PRN
  Administered 2015-05-21: 50 mL via INTRA_ARTERIAL

## 2015-05-21 MED ORDER — SODIUM CHLORIDE 0.9 % IR SOLN
Status: DC | PRN
  Administered 2015-05-21: 500 mL

## 2015-05-21 MED ORDER — FOLIC ACID 1 MG PO TABS
1.0000 mg | ORAL_TABLET | Freq: Every day | ORAL | Status: DC
  Administered 2015-05-22 – 2015-05-24 (×3): 1 mg via ORAL
  Filled 2015-05-21 (×6): qty 1

## 2015-05-21 MED ORDER — HYDROMORPHONE HCL 1 MG/ML IJ SOLN
INTRAMUSCULAR | Status: AC
  Filled 2015-05-21: qty 1

## 2015-05-21 MED ORDER — THROMBIN 20000 UNITS EX SOLR
CUTANEOUS | Status: AC
  Filled 2015-05-21: qty 20000

## 2015-05-21 MED ORDER — PIPERACILLIN-TAZOBACTAM 3.375 G IVPB 30 MIN
3.3750 g | Freq: Once | INTRAVENOUS | Status: AC
  Administered 2015-05-21: 3.375 g via INTRAVENOUS

## 2015-05-21 MED ORDER — SODIUM CHLORIDE 0.9 % IV SOLN
INTRAVENOUS | Status: DC
  Administered 2015-05-21 – 2015-05-23 (×2): via INTRAVENOUS

## 2015-05-21 MED ORDER — PROMETHAZINE HCL 25 MG/ML IJ SOLN: 6.2500 mg | INTRAMUSCULAR | Status: DC | PRN

## 2015-05-21 MED ORDER — PIPERACILLIN-TAZOBACTAM 3.375 G IVPB
3.3750 g | Freq: Once | INTRAVENOUS | Status: DC
  Filled 2015-05-21: qty 50

## 2015-05-21 MED ORDER — METOPROLOL TARTRATE 1 MG/ML IV SOLN
2.0000 mg | INTRAVENOUS | Status: DC | PRN
  Administered 2015-05-22: 5 mg via INTRAVENOUS
  Filled 2015-05-21: qty 5

## 2015-05-21 MED ORDER — LABETALOL HCL 5 MG/ML IV SOLN
5.0000 mg | INTRAVENOUS | Status: DC | PRN
  Administered 2015-05-21: 10 mg via INTRAVENOUS

## 2015-05-21 MED ORDER — FENTANYL CITRATE (PF) 100 MCG/2ML IJ SOLN
INTRAMUSCULAR | Status: DC | PRN
  Administered 2015-05-21 (×2): 50 ug via INTRAVENOUS
  Administered 2015-05-21 (×3): 25 ug via INTRAVENOUS
  Administered 2015-05-21: 50 ug via INTRAVENOUS
  Administered 2015-05-21: 25 ug via INTRAVENOUS

## 2015-05-21 MED ORDER — PHENOL 1.4 % MT LIQD: 1.0000 | OROMUCOSAL | Status: DC | PRN

## 2015-05-21 MED ORDER — LEVALBUTEROL HCL 1.25 MG/0.5ML IN NEBU
1.2500 mg | INHALATION_SOLUTION | Freq: Four times a day (QID) | RESPIRATORY_TRACT | Status: DC
  Administered 2015-05-22 (×3): 1.25 mg via RESPIRATORY_TRACT
  Filled 2015-05-21 (×6): qty 0.5

## 2015-05-21 MED ORDER — SUGAMMADEX SODIUM 200 MG/2ML IV SOLN
INTRAVENOUS | Status: DC | PRN
  Administered 2015-05-21: 120 mg via INTRAVENOUS

## 2015-05-21 MED ORDER — HYDROMORPHONE HCL 1 MG/ML IJ SOLN
0.2500 mg | INTRAMUSCULAR | Status: DC | PRN
  Administered 2015-05-21: 0.5 mg via INTRAVENOUS

## 2015-05-21 MED ORDER — DEXTROSE 5 % IV SOLN
500.0000 mg | INTRAVENOUS | Status: DC
  Administered 2015-05-21: 500 mg via INTRAVENOUS
  Filled 2015-05-21 (×2): qty 500

## 2015-05-21 MED ORDER — 0.9 % SODIUM CHLORIDE (POUR BTL) OPTIME
TOPICAL | Status: DC | PRN
  Administered 2015-05-21: 3000 mL

## 2015-05-21 MED ORDER — PROPOFOL 10 MG/ML IV BOLUS
INTRAVENOUS | Status: DC | PRN
  Administered 2015-05-21: 130 mg via INTRAVENOUS

## 2015-05-21 MED ORDER — DOCUSATE SODIUM 100 MG PO CAPS: 100.0000 mg | ORAL_CAPSULE | Freq: Every day | ORAL | Status: DC

## 2015-05-21 MED ORDER — ALBUMIN HUMAN 5 % IV SOLN
INTRAVENOUS | Status: DC | PRN
  Administered 2015-05-21: 16:00:00 via INTRAVENOUS

## 2015-05-21 MED ORDER — ENSURE ENLIVE PO LIQD
237.0000 mL | Freq: Two times a day (BID) | ORAL | Status: DC
  Administered 2015-05-22 – 2015-05-24 (×3): 237 mL via ORAL

## 2015-05-21 MED ORDER — MAGNESIUM SULFATE 2 GM/50ML IV SOLN: 2.0000 g | Freq: Every day | INTRAVENOUS | Status: DC | PRN

## 2015-05-21 MED ORDER — LABETALOL HCL 5 MG/ML IV SOLN
10.0000 mg | INTRAVENOUS | Status: DC | PRN
  Administered 2015-05-21: 10 mg via INTRAVENOUS
  Filled 2015-05-21: qty 4

## 2015-05-21 MED ORDER — PHENYLEPHRINE HCL 10 MG/ML IJ SOLN
10.0000 mg | INTRAVENOUS | Status: DC | PRN
  Administered 2015-05-21: 25 ug/min via INTRAVENOUS

## 2015-05-21 MED ORDER — PROPOFOL 10 MG/ML IV BOLUS
INTRAVENOUS | Status: AC
  Filled 2015-05-21: qty 20

## 2015-05-21 MED ORDER — HYDROMORPHONE HCL 1 MG/ML IJ SOLN
0.5000 mg | INTRAMUSCULAR | Status: DC | PRN
  Administered 2015-05-21: 0.5 mg via INTRAVENOUS
  Administered 2015-05-21 – 2015-05-25 (×20): 1 mg via INTRAVENOUS
  Filled 2015-05-21 (×20): qty 1

## 2015-05-21 MED ORDER — CEFAZOLIN SODIUM-DEXTROSE 2-3 GM-% IV SOLR
2.0000 g | INTRAVENOUS | Status: AC
  Administered 2015-05-21: 2 g via INTRAVENOUS

## 2015-05-21 SURGICAL SUPPLY — 66 items
BANDAGE ELASTIC 4 VELCRO ST LF (GAUZE/BANDAGES/DRESSINGS) IMPLANT
BANDAGE ESMARK 6X9 LF (GAUZE/BANDAGES/DRESSINGS) IMPLANT
BNDG ESMARK 6X9 LF (GAUZE/BANDAGES/DRESSINGS)
CANISTER SUCTION 2500CC (MISCELLANEOUS) ×3 IMPLANT
CANNULA VESSEL 3MM 2 BLNT TIP (CANNULA) ×6 IMPLANT
CATH EMB 4FR 80CM (CATHETERS) ×3 IMPLANT
CLIP TI MEDIUM 24 (CLIP) ×3 IMPLANT
CLIP TI WIDE RED SMALL 24 (CLIP) ×3 IMPLANT
CUFF TOURNIQUET SINGLE 24IN (TOURNIQUET CUFF) IMPLANT
CUFF TOURNIQUET SINGLE 34IN LL (TOURNIQUET CUFF) IMPLANT
CUFF TOURNIQUET SINGLE 44IN (TOURNIQUET CUFF) IMPLANT
DERMABOND ADVANCED (GAUZE/BANDAGES/DRESSINGS) ×4
DERMABOND ADVANCED .7 DNX12 (GAUZE/BANDAGES/DRESSINGS) ×2 IMPLANT
DRAIN CHANNEL 15F RND FF W/TCR (WOUND CARE) IMPLANT
DRAPE PROXIMA HALF (DRAPES) ×3 IMPLANT
DRAPE X-RAY CASS 24X20 (DRAPES) ×3 IMPLANT
DRSG COVADERM 4X10 (GAUZE/BANDAGES/DRESSINGS) IMPLANT
DRSG COVADERM 4X8 (GAUZE/BANDAGES/DRESSINGS) IMPLANT
ELECT REM PT RETURN 9FT ADLT (ELECTROSURGICAL) ×3
ELECTRODE REM PT RTRN 9FT ADLT (ELECTROSURGICAL) ×1 IMPLANT
EVACUATOR SILICONE 100CC (DRAIN) IMPLANT
GAUZE SPONGE 4X4 16PLY XRAY LF (GAUZE/BANDAGES/DRESSINGS) ×3 IMPLANT
GLOVE BIO SURGEON STRL SZ7.5 (GLOVE) ×3 IMPLANT
GLOVE BIOGEL PI IND STRL 6.5 (GLOVE) ×1 IMPLANT
GLOVE BIOGEL PI IND STRL 7.0 (GLOVE) ×3 IMPLANT
GLOVE BIOGEL PI IND STRL 7.5 (GLOVE) ×2 IMPLANT
GLOVE BIOGEL PI IND STRL 8 (GLOVE) ×1 IMPLANT
GLOVE BIOGEL PI INDICATOR 6.5 (GLOVE) ×2
GLOVE BIOGEL PI INDICATOR 7.0 (GLOVE) ×6
GLOVE BIOGEL PI INDICATOR 7.5 (GLOVE) ×4
GLOVE BIOGEL PI INDICATOR 8 (GLOVE) ×2
GLOVE SURG SS PI 7.5 STRL IVOR (GLOVE) ×3 IMPLANT
GOWN STRL REUS W/ TWL LRG LVL3 (GOWN DISPOSABLE) ×4 IMPLANT
GOWN STRL REUS W/TWL LRG LVL3 (GOWN DISPOSABLE) ×8
GRAFT HEMASHIELD 8MM (Vascular Products) ×2 IMPLANT
GRAFT VASC STRG 30X8KNIT (Vascular Products) ×1 IMPLANT
INSERT FOGARTY SM (MISCELLANEOUS) ×3 IMPLANT
KIT BASIN OR (CUSTOM PROCEDURE TRAY) ×3 IMPLANT
KIT ROOM TURNOVER OR (KITS) ×3 IMPLANT
MARKER GRAFT CORONARY BYPASS (MISCELLANEOUS) IMPLANT
NS IRRIG 1000ML POUR BTL (IV SOLUTION) ×6 IMPLANT
PACK PERIPHERAL VASCULAR (CUSTOM PROCEDURE TRAY) ×3 IMPLANT
PAD ARMBOARD 7.5X6 YLW CONV (MISCELLANEOUS) ×6 IMPLANT
PADDING CAST COTTON 6X4 STRL (CAST SUPPLIES) IMPLANT
SET COLLECT BLD 21X3/4 12 (NEEDLE) ×3 IMPLANT
SPONGE SURGIFOAM ABS GEL 100 (HEMOSTASIS) IMPLANT
STAPLER VISISTAT (STAPLE) ×3 IMPLANT
STOPCOCK 4 WAY LG BORE MALE ST (IV SETS) ×3 IMPLANT
STRIP PERIGUARD 6X8 (Vascular Products) ×3 IMPLANT
SUT ETHILON 3 0 PS 1 (SUTURE) IMPLANT
SUT PROLENE 5 0 C 1 24 (SUTURE) ×9 IMPLANT
SUT PROLENE 6 0 BV (SUTURE) ×12 IMPLANT
SUT PROLENE 7 0 BV 1 (SUTURE) IMPLANT
SUT SILK 2 0 FS (SUTURE) ×3 IMPLANT
SUT SILK 3 0 (SUTURE)
SUT SILK 3-0 18XBRD TIE 12 (SUTURE) IMPLANT
SUT VIC AB 2-0 CTB1 (SUTURE) ×6 IMPLANT
SUT VIC AB 3-0 SH 27 (SUTURE) ×4
SUT VIC AB 3-0 SH 27X BRD (SUTURE) ×2 IMPLANT
SUT VICRYL 4-0 PS2 18IN ABS (SUTURE) ×6 IMPLANT
SYR 3ML LL SCALE MARK (SYRINGE) ×3 IMPLANT
SYRINGE 1CC SLIP TB (MISCELLANEOUS) ×3 IMPLANT
TRAY FOLEY W/METER SILVER 16FR (SET/KITS/TRAYS/PACK) ×3 IMPLANT
TUBING EXTENTION W/L.L. (IV SETS) ×3 IMPLANT
UNDERPAD 30X30 INCONTINENT (UNDERPADS AND DIAPERS) ×3 IMPLANT
WATER STERILE IRR 1000ML POUR (IV SOLUTION) ×3 IMPLANT

## 2015-05-21 NOTE — Anesthesia Postprocedure Evaluation (Signed)
  Anesthesia Post-op Note  Patient: Mark Hurst  Procedure(s) Performed: Procedure(s): Bilateral Femoral Endarterectomies with Bovine Patch Angioplasties; Bilateral Femoral Thrombectomies; Left to Right Femoral to Femoral Bypass Graft; Intraop Arteriograms times two (Bilateral)  Patient Location: PACU  Anesthesia Type:General  Level of Consciousness: awake and alert   Airway and Oxygen Therapy: Patient Spontanous Breathing  Post-op Pain: mild  Post-op Assessment: Post-op Vital signs reviewed and Patient's Cardiovascular Status Stable              Post-op Vital Signs: Reviewed and stable  Last Vitals:  Filed Vitals:   05/21/15 1954  BP:   Pulse:   Temp: 36.3 C  Resp:     Complications: No apparent anesthesia complications

## 2015-05-21 NOTE — ED Notes (Signed)
[facility] called for transport request

## 2015-05-21 NOTE — Transfer of Care (Signed)
Immediate Anesthesia Transfer of Care Note  Patient: Mark Hurst  Procedure(s) Performed: Procedure(s): Bilateral Femoral Endarterectomies with Bovine Patch Angioplasties; Bilateral Femoral Thrombectomies; Left to Right Femoral to Femoral Bypass Graft; Intraop Arteriograms times two (Bilateral)  Patient Location: PACU  Anesthesia Type:General  Level of Consciousness: awake, alert , oriented and patient cooperative  Airway & Oxygen Therapy: Patient Spontanous Breathing and Patient connected to face mask oxygen  Post-op Assessment: Report given to RN, Post -op Vital signs reviewed and stable, Patient moving all extremities, Patient moving all extremities X 4 and Patient able to stick tongue midline  Post vital signs: Reviewed  Last Vitals:  Filed Vitals:   05/21/15 1316  BP: 156/73  Pulse: 111  Temp:   Resp: 21    Complications: No apparent anesthesia complications

## 2015-05-21 NOTE — Op Note (Signed)
NAME: Mark Hurst   MRN: [id number] DOB: 09-12-1951    DATE OF OPERATION: 05/21/2015  PREOP DIAGNOSIS: Bilateral lower extremity ischemia  POSTOP DIAGNOSIS: Same  PROCEDURE:  1. Bilateral common femoral artery endarterectomies with bovine pericardial patch angioplasties 2. Bilateral femoral embolectomies 3. Left to right femorofemoral bypass graft (8 mm Dacron graft) 4. Intraoperative arteriogram 2  SURGEON: Judeth Cornfield. Scot Dock, MD, FACS  ASSIST: Vernia Buff RNFA  ANESTHESIA: Gen.   EBL: minimal  INDICATIONS: [name] is a 64 y.o. male who developed significant bilateral leg pain 2 days ago. He presented to the [facility] emergency department and had no Doppler flow in the feet. He was transferred to [facility] for vascular evaluation. He had evidence of severe diffuse multilevel arterial occlusive disease. It was felt that his only chance for limb salvage was attempted exploration.  FINDINGS: The patient had severe diffuse atherosclerotic disease of the common femoral arteries and superficial femoral arteries. Intraoperative arteriogram showed no patent distal vessels for bypass.  TECHNIQUE: The patient was taken to the operating room and received a general anesthetic. The left chest left infraclavicular area both groins and both lower extremities were prepped and draped in usual sterile fashion. If I was unable to get inflow once we've the option of an axillofemoral bypass graft open.  Attention was first turned to the left groin. A longitudinal incision was made in the left groin. The common femoral, deep femoral, and superficial femoral arteries were dissected free. I dissected well above the inguinal ligament in order to find an area on the external iliac artery which I could clamp. Branches were also controlled with vessel loops. Before heparinizing, a longitudinal incision was made in the right groin and likewise on the right side the common femoral, deep femoral,  superficial femoral arteries were all controlled. Branches were also controlled. There was diffuse disease of the common femoral arteries bilaterally. The patient was then heparinized.  Attention was first turned to the left groin. The external iliac artery was clamped and the superficial femoral artery and deep femoral arteries were controlled. A longitudinal arteriotomy was made in the common femoral artery and this was extended proximally and distally. There was extensive plaque which I endarterectomized. The endarterectomy extended fairly high. Distally the endarterectomy extended into the deep femoral artery. The superficial femoral artery appeared to be chronically occluded. I was able to get good inflow proximally. I also retrieved some clot from the proximal deep femoral artery. I suspect the patient had severe diffuse disease and thrombosed this is the etiology of his symptoms. A bovine pericardial patch was used to close the arteriotomy and this was done with running 60 proline suture.  Attention was then turned to the right groin. Again the external iliac artery was clamped. The common femoral and superficial femoral arteries were controlled. A longitudinal arteriotomy was made in the common femoral artery and this was extended down to the SFA. Endarterectomy plane was established and again this extended down into the superficial and deep femoral arteries. However there was not good inflow on the right side and according to the records it was a chronic proximal occlusion. I did pass a 30 cath which would not advance confirming the suspicion. After the endarterectomy was completed the bovine pericardial patch was sewn using continuous 50 proline suture. Of note the Fogarty catheter was passed down the deep femoral artery on the left and I did some retrieved some clot from the left side as described  above. On the right side and passed the Fogarty catheter down the deep femoral artery and there was no  clot retrieved. The superficial femoral arteries appeared to be chronically occluded.  Given that I was not pleased with the inflow on the right and elected to proceed with a left-to-right femorofemoral bypass graft. An 8 mm Dacron graft was tunneled between the 2 incisions in an inverted U fashion. The patient was redosed with heparin. On the right side the vessels were controlled again and a longitudinal arteriotomy made in the bovine pericardial patch. The Dacron graft was spatulated and sewn end-to-side to the patch using continuous 50 proline suture. The graft was then clamped after the anastomosis was completed and the vessel loops released. On the left side and again the vessels were controlled and a longitudinal graftotomy made in the bovine pericardial patch. The left limb of the femorofemoral graft was sewn end-to-side to the patch using continuous 50 proline suture. The arteries were backbled and flushed appropriately and the anastomosis completed. At this point there was excellent Doppler flow in both deep femoral arteries and also in the popliteal arteries bilaterally. However there was no distal flow.  Intraoperative arteriograms were obtained in both sides by cannulating the distal aspect of the femorofemoral graft on both sides. These films did not show any patent vessels distally. I therefore did not think it would be prudent to explore distally given the overall situation. 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  Next hemostasis was obtained in the wounds and each of the wounds was closed with a deep layer of 2-0 Vicryl, subcutaneous layer with 2-0 Vicryl, and the skin closed with a 4-0 subcuticular stitch. Sterile dressing was applied. The patient tolerated the procedure well transferred to the recovery room in stable condition. All needle and sponge counts were correct.  Deitra Mayo, MD, FACS [facility]  DATE OF DICTATION:   05/21/2015

## 2015-05-21 NOTE — Progress Notes (Signed)
Patient heading to surgery report given to short stay RN all questions answered at this time.

## 2015-05-21 NOTE — OR Nursing (Signed)
Foley inserted by Dr. Scot Dock due to assessment of penis shaft being split.  Foley inserted without difficulty.

## 2015-05-21 NOTE — ED Notes (Signed)
Labs obtained. Extra gold and blue tubes sent to lab for save. Dark green tube in ED mini-lab.

## 2015-05-21 NOTE — Anesthesia Preprocedure Evaluation (Addendum)
Anesthesia Evaluation  Patient identified by MRN, date of birth, ID band Patient awake    Reviewed: Allergy & Precautions, NPO status , Patient's Chart, lab work & pertinent test results, reviewed documented beta blocker date and time   Airway Mallampati: I       Dental   Pulmonary COPDformer smoker,  - rhonchi  + decreased breath sounds      Cardiovascular hypertension, Pt. on medications and Pt. on home beta blockers + CAD and + Peripheral Vascular Disease Rhythm:Regular Rate:Normal     Neuro/Psych negative psych ROS   GI/Hepatic negative GI ROS, (+) Hepatitis -, A  Endo/Other  diabetes, Type 2, Oral Hypoglycemic Agents  Renal/GU   negative genitourinary   Musculoskeletal negative musculoskeletal ROS (+)   Abdominal   Peds negative pediatric ROS (+)  Hematology negative hematology ROS (+)   Anesthesia Other Findings   Reproductive/Obstetrics negative OB ROS                           Lab Results  Component Value Date   WBC 8.5 05/21/2015   HGB 14.6 05/21/2015   HCT 43.0 05/21/2015   MCV 86.9 05/21/2015   PLT 301 05/21/2015   Lab Results  Component Value Date   INR 0.96 03/23/2015   INR 1.58* 03/16/2015   INR 1.03 03/03/2015   Lab Results  Component Value Date   CREATININE 0.90 05/21/2015   BUN 29* 05/21/2015   NA 127* 05/21/2015   K 4.7 05/21/2015   CL 97* 05/21/2015   CO2 24 05/21/2015   03/2015: EKG: normal sinus rhythm.  Anesthesia Physical Anesthesia Plan  ASA: III and emergent  Anesthesia Plan: General   Post-op Pain Management:    Induction: Intravenous  Airway Management Planned: Oral ETT  Additional Equipment: Arterial line  Intra-op Plan:   Post-operative Plan: Extubation in OR  Informed Consent: I have reviewed the patients History and Physical, chart, labs and discussed the procedure including the risks, benefits and alternatives for the proposed  anesthesia with the patient or authorized representative who has indicated his/her understanding and acceptance.   Dental advisory given  Plan Discussed with: CRNA  Anesthesia Plan Comments:         Anesthesia Quick Evaluation

## 2015-05-21 NOTE — H&P (Addendum)
[facility] History and Physical  Mark Hurst MIW:[id number] DOB: [birth year] DOA: 05/21/2015  Referring physician: ED physician, Dr. 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 Julien Nordmann, no presented to [facility] ED with main concern of one week duration of progressively worsening bilateral LE pain, LLE > RLE, constant and sharp/10/10 in severity, non radiating, worse with ambulation and even minimal movement, no specific alleviating factors, associated with cold extremities and numbness, malaise. Pt denies fevers, chills, no specific abdominal or urinary concerns. Pt denies chest pain but endorses exertional dyspnea with chronic non productive cough.   In ED, pt noted to be in mild distress due to dyspnea and pain. Physical exam notable for absence of pulses in LE, arterial  doppler confirming findings and vascular surgery team consulted for further evaluation. Request made to transfer pt to [facility] SDU.  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 Julien Nordmann of pt's admission  Acute hypoxic respiratory failure in pt with known COPD and recently diagnosed NSCLC - secondary to possible lobar PNA and underlying COPD - place on empiric ABX rocephin and zithromax for now - sputum culture requested, urine legionella and strep pneumo - also provide BD's scheduled and as needed  - keep on oxygen via [place] for now and monitor in SDU Sepsis  secondary to lobar PNA, right basilar PNA - pt meets sepsis criteria with tachycardia and tachypnea, source likely PNA - repeat lactic acid and check procalcitonin level - abx for PNA as noted above  - sepsis order set in place  DM type II with complications on PVD - hold metformin that pt takes at home - place on SSI for now while pt is NPO HTN - continue metoprolol and lisinopril per home medical regimen  HLD - continue statin  A-fib, rate controlled, CHADS2 score = 4 - has been on Eliquis but this has been on hold - currently in sinus rhythm - continue Metoprolol - last ECHO in 03/2015 with normal EF  Tobacco use - will need to address smoking cessation once pt feels better  - review of records indicate that Na has been at baseline ~ 126 in the past few months   DVT prophylaxis - Lovenox SQ   Radiological Exams on Admission: Dg Chest Port 1 View 05/21/2015   Stable right apical mass.  Patchy right basilar infiltrate which may represent acute pneumonia.  Diffuse interstitial changes likely related to the underlying neoplasm  Code Status: Full Family Communication: Pt at bedside Disposition Plan: Admit for further evaluation    Mart Piggs [facility] [phone]   Review of Systems:  Constitutional: Negative for fever, chills and malaise/fatigue. Negative for diaphoresis.  HENT: Negative for hearing loss, ear pain, nosebleeds, congestion, sore throat, neck pain, tinnitus and ear discharge.   Eyes: Negative for blurred vision, double vision, photophobia, pain, discharge and redness.  Respiratory: Negative for  hemoptysis and stridor.  Cardiovascular: Negative for chest pain, palpitations, orthopnea.  Gastrointestinal: Negative for nausea, vomiting and abdominal pain.  Genitourinary: Negative for dysuria, urgency, frequency, hematuria and flank pain.  Musculoskeletal: Negative for joint pain and falls.  Skin: Negative for itching and rash.  Neurological: Negative for dizziness and  weakness.  Endo/Heme/Allergies: Negative for environmental allergies and polydipsia. Does not bruise/bleed easily.  Psychiatric/Behavioral: Negative for suicidal ideas. The patient is not nervous/anxious.      Past Medical History  Diagnosis Date  . WPW (Wolff-Parkinson-White syndrome)     ablated  . DM (dermatomyositis)     x 10 years  . Hyperlipidemia     x 13 years  . Tobacco abuse   . CAD (coronary artery disease)     1997 LAD 95% stenosis. He had Rotablator of small  couple lesions in this astery. His last stress perfusion study was in 2001 with no evidence of ischemia.  . Iliac artery occlusion, right   . Hypertension     x 13 years  . Anginal pain   . Type II diabetes mellitus     Type 2  . Lung cancer     stage 4 - with mets  . Hepatitis A 1972    "in [employer]"  . Shortness of breath dyspnea     Past Surgical History  Procedure Laterality Date  . Small intestine surgery  ~ 1976    following ingestion of a fish bone  . Fracture surgery    . Colon surgery    . Hip fracture surgery Right 1976?    "have steel plate put in"  . Percutaneous coronary rotoblator intervention (pci-r)  ~ 1998  . Coronary angioplasty  ~ 1998  . Cataract extraction w/ intraocular lens  implant, bilateral Bilateral ~ 2002  . Radiofrequency ablation      for WPW  . Video bronchoscopy with endobronchial navigation N/A 03/23/2015    Procedure: VIDEO BRONCHOSCOPY WITH ENDOBRONCHIAL NAVIGATION;  Surgeon: Collene Gobble, MD;  Location: [facility] OR;  Service: Thoracic;  Laterality: N/A;  . Multiple extractions with alveoloplasty N/A 05/11/2015    Procedure: Extraction of tooth #'s 2,5,6,7,8,11,12,17, 20,21,22,23,24,25,26,27,29 with alveoloplasty;  Surgeon: Lenn Cal, DDS;  Location: [facility] ORS;  Service: Oral Surgery;  Laterality: N/A;  . Genital modification N/A     Social History:  reports that he quit smoking about 2 weeks ago. His smoking use included Cigarettes. He has a 94 pack-year smoking  history. He has never used smokeless tobacco. He reports that he drinks about 2.4 oz of alcohol per week. He reports that he does not use illicit drugs.  Allergies  Allergen Reactions  . Codeine Itching  . Percocet [Oxycodone-Acetaminophen] Other (See Comments)    Sharpe Kidney pains    Family History  Problem Relation Age of Onset  . Diabetes Mother   . Depression Mother   . Coronary artery disease Father 41  . Lung cancer Father   . Cancer Father   . Colon cancer Mother   . Bladder Cancer Brother   . Esophageal cancer Brother     Medication Sig  aspirin 81 MG tablet Take 81 mg by mouth daily.    atorvastatin (LIPITOR) 80 MG tablet Take 40 mg by mouth daily at 6 PM.  insulin regular (NOVOLIN R,HUMULIN R) 100 units/mL injection Inject 20 Units into the skin every morning. Due to not eating patient not taking insulin  lisinopril (PRINIVIL,ZESTRIL) 40 MG tablet Take 20 mg daily. Patient taking differently: Take  20 mg by mouth daily.   loratadine (CLARITIN) 10 MG tablet Take 10 mg by mouth daily.    metFORMIN (GLUCOPHAGE) 500 MG tablet Take 1,000 mg by mouth 2 (two) times daily with a meal. Not taking due to not eating  metoprolol (LOPRESSOR) 50 MG tablet Take 25 mg by mouth 2 (two) times daily.   Multiple Vitamin (MULTIVITAMIN) tablet Take 1 tablet by mouth daily.  ondansetron (ZOFRAN) 8 MG tablet Take 8 mg by mouth every 8 (eight) hours as needed for nausea or vomiting.   tamsulosin (FLOMAX) 0.4 MG CAPS capsule Take 0.4 mg by mouth daily.  traMADol (ULTRAM) 50 MG tablet Take 1 tablet (50 mg total) by mouth every 6 (six) hours as needed.    Physical Exam: Filed Vitals:   05/21/15 0422 05/21/15 0635  BP: 149/88 158/74  Pulse: 114 96  Temp: 97.8 F (36.6 C)   TempSrc: Oral   Resp: 18 19  SpO2: 96% 94%    Physical Exam  Constitutional: Appears chronically ill and with mild dyspnea  HENT: Normocephalic. External right and left ear normal.  Eyes: Conjunctivae and EOM are  normal. PERRLA, no scleral icterus.  Neck: Normal ROM. Neck supple. No JVD. No tracheal deviation. No thyromegaly.  CVS: RRR, S1/S2 +, no gallops, no carotid bruit.  Pulmonary: Effort and breath sounds normal, no stridor, rhonchi, diminished breath sounds at bases  Abdominal: Soft. BS +,  no distension, tenderness, rebound or guarding.  Musculoskeletal: RLL with no femoral pulse, popliteal pulse, or pedal pulses. LLE with diminished femoral pulse, no popliteal or pedal pulses, left lower extremity is mottled to the proximal thigh. Lymphadenopathy: No lymphadenopathy noted, cervical, inguinal. Neuro: Alert. No cranial nerve deficit.Diminished sensation in bilateral LE Skin: No rash noted. Not diaphoretic. Psychiatric: Normal mood and affect. Behavior, judgment, thought content normal.   Labs on Admission:  Basic Metabolic Panel:  Recent Labs Lab 05/16/15 1140 05/18/15 0240 05/21/15 0759  NA 128* 128* 127*  K 5.3* 4.8 4.7  CL  --  94* 97*  CO2 26 22  --   GLUCOSE 335* 301* 243*  BUN 33.7* 40* 29*  CREATININE 1.3 1.06 0.90  CALCIUM 11.6* 10.3  --    Liver Function Tests:  Recent Labs Lab 05/16/15 1140 05/18/15 0240  AST 18 24  ALT 24 25  ALKPHOS 141 110  BILITOT 0.29 0.3  PROT 7.5 7.1  ALBUMIN 2.8* 2.8*   CBC:  Recent Labs Lab 05/16/15 1139 05/18/15 0240 05/21/15 0754 05/21/15 0759  WBC 8.1 8.6 8.5  --   NEUTROABS 6.7* 7.0 7.0  --   HGB 13.8 12.9* 13.3 14.6  HCT 41.5 37.4* 39.1 43.0  MCV 89.0 86.8 86.9  --   PLT 316 312 301  --    CBG:  Recent Labs Lab 05/21/15 0745  GLUCAP 256*    EKG: pending    If 7PM-7AM, please contact night-coverage [email or web address] Password [email or web address] 05/21/2015, 8:27 AM

## 2015-05-21 NOTE — Consult Note (Signed)
[facility]  Patient name: Mark Hurst MRN: [id number] DOB: 1951/07/20 Sex: male  REASON FOR CONSULT: Bilateral lower extremity ischemia.  HPI: Mark Hurst is a 64 y.o. male with a history of metastatic adenocarcinoma of the lung. He has metastatic disease to his spine and hips. He presented to the emergency department today with severe bilateral lower extremity pain and was found to have ischemic lower extremities. He was transferred to [facility] to be admitted by the hospitalists with vascular surgery consultation.  On my history, the patient states that he has a long history of "poor circulation". He smoked 2 packs per day of cigarettes for his long as he can remember. In addition he has a history of diabetes, hypertension, and hypercholesterolemia. He describes some bilateral hip claudication. I do not get any history of clear-cut rest pain or previous nonhealing ulcers.  He was diagnosed with a metastatic lung cancer in June. He denies any previous history of DVT. He does tell me that he had an episode of atrial fibrillation earlier this year and was on Eliquis which was held because he required several procedures including dental extraction prior to chemotherapy.  2 days ago he developed a gradual onset of pain initially in the left lower extremity and then in both lower extremities. Pain became progressively worse and he presented to the emergency department at [facility] today.  In reviewing his records, it appears that he has a known right common iliac artery occlusion.  Past Medical History  Diagnosis Date  . WPW (Wolff-Parkinson-White syndrome)     ablated  . DM (dermatomyositis)     x 10 years  . Hyperlipidemia     x 13 years  . Tobacco abuse   . CAD (coronary artery disease)     1997 LAD 95% stenosis. He had Rotablator of small  couple lesions in this astery. His last stress perfusion study was in 2001 with no evidence of ischemia.  .  Iliac artery occlusion, right   . Hypertension     x 13 years  . Anginal pain   . Type II diabetes mellitus     Type 2  . Lung cancer     stage 4 - with mets  . Hepatitis A 1972    "in [employer]"  . Shortness of breath dyspnea     Family History  Problem Relation Age of Onset  . Diabetes Mother   . Depression Mother   . Coronary artery disease Father 73  . Lung cancer Father   . Cancer Father   . Colon cancer Mother   . Bladder Cancer Brother   . Esophageal cancer Brother     SOCIAL HISTORY: Social History  Substance Use Topics  . Smoking status: Former Smoker -- 2.00 packs/day for 47 years    Types: Cigarettes    Quit date: 05/02/2015  . Smokeless tobacco: Never Used  . Alcohol Use: 2.4 oz/week    4 Cans of beer, 0 Standard drinks or equivalent per week     Comment: Drinks 2 beers daily- quit 04/2015     Allergies  Allergen Reactions  . Codeine Itching  . Percocet [Oxycodone-Acetaminophen] Other (See Comments)    Sharpe Kidney pains    Current Facility-Administered Medications  Medication Dose Route Frequency Provider Last Rate Last Dose  . aspirin chewable tablet 81 mg  81 mg Oral Daily Theodis Blaze, MD      . enoxaparin (LOVENOX) injection  40 mg  40 mg Subcutaneous Q24H Theodis Blaze, MD      . folic acid (FOLVITE) tablet 1 mg  1 mg Oral Daily Theodis Blaze, MD      . HYDROmorphone (DILAUDID) injection 0.5 mg  0.5 mg Intravenous Q4H PRN Theodis Blaze, MD   0.5 mg at 05/21/15 1106  . metoprolol tartrate (LOPRESSOR) tablet 25 mg  25 mg Oral BID Theodis Blaze, MD      . ondansetron [facility]) tablet 4 mg  4 mg Oral Q6H PRN Theodis Blaze, MD       Or  . ondansetron [facility]) injection 4 mg  4 mg Intravenous Q6H PRN Theodis Blaze, MD      . tamsulosin (FLOMAX) capsule 0.4 mg  0.4 mg Oral Daily Theodis Blaze, MD        REVIEW OF SYSTEMS: Valu.Nieves ] denotes positive finding; [  ] denotes negative finding CARDIOVASCULAR:  '[ ]'$  chest pain   '[ ]'$  chest pressure   '[ ]'$  palpitations    '[ ]'$  orthopnea   Valu.Nieves ] dyspnea on exertion   Valu.Nieves ] claudication   '[ ]'$  rest pain   '[ ]'$  DVT   '[ ]'$  phlebitis PULMONARY:   '[ ]'$  productive cough   '[ ]'$  asthma   '[ ]'$  wheezing NEUROLOGIC:   Valu.Nieves ] weakness  '[ ]'$  paresthesias  '[ ]'$  aphasia  '[ ]'$  amaurosis  '[ ]'$  dizziness HEMATOLOGIC:   '[ ]'$  bleeding problems   '[ ]'$  clotting disorders MUSCULOSKELETAL:  Valu.Nieves ] joint pain   '[ ]'$  joint swelling '[ ]'$  leg swelling GASTROINTESTINAL: '[ ]'$   blood in stool  '[ ]'$   hematemesis GENITOURINARY:  '[ ]'$   dysuria  '[ ]'$   hematuria PSYCHIATRIC:  '[ ]'$  history of major depression INTEGUMENTARY:  '[ ]'$  rashes  '[ ]'$  ulcers CONSTITUTIONAL:  '[ ]'$  fever   '[ ]'$  chills  PHYSICAL EXAM: Filed Vitals:   05/21/15 0831 05/21/15 1039 05/21/15 1107 05/21/15 1202  BP: 148/75 141/74 146/75 130/69  Pulse:  107 102 106  Temp:    98 F (36.7 C)  TempSrc:    Oral  Resp: '20 19 19 16  '$ Height:    '5\' 7"'$  (1.702 m)  Weight:    129 lb 6.6 oz (58.7 kg)  SpO2:  94% 95% 95%   Body mass index is 20.26 kg/(m^2). GENERAL: The patient is a poorly nourished  male, in mild distress. The vital signs are documented above. CARDIAC: There is a regular rate and rhythm.  VASCULAR: I do not detect carotid bruits. On the right side, I cannot palpate a femoral pulse, popliteal pulse, or pedal pulses. There is no Doppler flow in the right foot. Right foot however is not mottled. On the left side, he has a diminished femoral pulse, no popliteal or pedal pulses. There is no Doppler flow in the left foot. The left lower extremity is mottled to the proximal thigh. There is no significant lower extremity ischemia. PULMONARY: There is good air exchange bilaterally without wheezing or rales. ABDOMEN: Soft and non-tender with normal pitched bowel sounds.  MUSCULOSKELETAL: There are no major deformities. NEUROLOGIC: he has decreased motor and sensory function of the left foot. SKIN: There are no ulcers or rashes noted. PSYCHIATRIC: The patient has a normal affect.  DATA:  Lab Results    Component Value Date   WBC 8.5 05/21/2015   HGB 14.6 05/21/2015   HCT 43.0 05/21/2015  MCV 86.9 05/21/2015   PLT 301 05/21/2015   Lab Results  Component Value Date   NA 127* 05/21/2015   K 4.7 05/21/2015   CL 97* 05/21/2015   CO2 24 05/21/2015   Lab Results  Component Value Date   CREATININE 0.90 05/21/2015   Lab Results  Component Value Date   INR 0.96 03/23/2015   INR 1.58* 03/16/2015   INR 1.03 03/03/2015   Lab Results  Component Value Date   HGBA1C 7.3* 03/16/2015   CBG (last 3)   Recent Labs  05/21/15 0745 05/21/15 1124  GLUCAP 256* 150*   ARTERIAL DUPLEX: No doppler flow in both feet.  Right: markedly dampened CFA waveform. Diffuse plaque CFA and SFA. Left: Monophasic CFA waveform. SFA occluded.   EKG shows sinus tachycardia.  MEDICAL ISSUES:  BILATERAL LOWER EXTREMITY ISCHEMIA:   I am most concerned about the left leg as the left lower extremity is mottled to the proximal thigh. Based on the history this has been ischemic for 2 days. He has no Doppler flow in the left foot with decreased motor and sensory function. I have explained that without revascularization he would certainly require a high left above-the-knee amputation. We have also discussed the option of comfort measures given his stage IV lung cancer. Although he has a history of atrial fibrillation he currently is in sinus rhythm. However, certainly embolic disease would be included in the differential diagnosis. My concern is that given his multiple risk factors for peripheral vascular disease and heavy smoking history with known multilevel disease, his options for revascularization may be limited. However, he is agreeable to proceed with femoral exploration and possible axillofemoral bypass or infrainguinal bypass in order to attempt limb salvage. He understands that even with attempted revascularization, and even if this is "successful," there is only a 50-50 chance of success given the severity  of this ischemia and his multilevel disease.  Although he has a known right common iliac artery occlusion according to the records, he does state that his right leg symptoms and also worsened significantly. Therefore I will also explore the right groin.  He last had a can of injury at 4 AM. We will proceed urgently.  Deitra Mayo [facility] [place]: [phone]

## 2015-05-21 NOTE — Progress Notes (Signed)
   05/21/15 1313 05/21/15 1316  Vitals  BP 124/66 mmHg (!) 156/73 mmHg  MAP (mmHg) 80 92  BP Location Right Arm Left Arm  BP Method Automatic Automatic

## 2015-05-21 NOTE — ED Notes (Signed)
Pt ambulated to restroom with one staff assistance. Pt reports he feel as if he may have a BM.

## 2015-05-21 NOTE — ED Notes (Addendum)
Pt BIB [facility] c/o left  leg pain and constipation. Pt was seen on Tuesday for same. Pt was given fentanyl and miralax, per visitor patient is not using miralax as prescribed. Fentanyl patch in place.  Pt has stage IV lung CA.

## 2015-05-21 NOTE — Progress Notes (Signed)
VASCULAR LAB PRELIMINARY  PRELIMINARY  PRELIMINARY  PRELIMINARY  Bilateral ABIs and arterial duplex completed.    Preliminary report:  Absent pedal signals bilaterally at the DP and PT.  Duplex imaging:  Right:  Limited inflow with severely dampened waveforms and velocities from the CFA through the popliteal a.  Severe mixed irregular plaque in the CFA and diffuse plaque throughout the FA.  Left:  Strong monophasic waveform in the CFA.  Occluded FA from the origin through the distal portion.  Popliteal waveform is virtually continuous with little difference between systolic and diastolic velocities  Mark Hurst, RVT 05/21/2015, 9:52 AM

## 2015-05-21 NOTE — ED Provider Notes (Signed)
CSN: [id number]     Arrival date & time 05/21/15  0419 History   First MD Initiated Contact with Patient 05/21/15 0654     Chief Complaint  Patient presents with  . Leg Pain  . 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 He has undergone palliative radiotherapy for his spine, he presents today with worsening leg pain left greater than right, associated pallor of the legs and associated constipation. His symptoms are persistent, severe, gradually worsening, associated with a cold and numb leg on the left  Past Medical History  Diagnosis Date  . WPW (Wolff-Parkinson-White syndrome)     ablated  . DM (dermatomyositis)     x 10 years  . Hyperlipidemia     x 13 years  . Tobacco abuse   . CAD (coronary artery disease)     1997 LAD 95% stenosis. He had Rotablator of small  couple lesions in this astery. His last stress perfusion study was in 2001 with no evidence of ischemia.  . Iliac artery occlusion, right   . Hypertension     x 13 years  . Anginal pain   . Type II diabetes mellitus     Type 2  . Lung cancer     stage 4 - with mets  . Hepatitis A 1972    "in [employer]"  . Shortness of breath dyspnea    Past Surgical History  Procedure Laterality Date  . Small intestine surgery  ~ 1976    following ingestion of a fish bone  . Fracture surgery    . Colon surgery    . Hip fracture surgery Right 1976?    "have steel plate put in"  . Percutaneous coronary rotoblator intervention (pci-r)  ~ 1998  . Coronary angioplasty  ~ 1998  . Cataract extraction w/ intraocular lens  implant, bilateral Bilateral ~ 2002  . Radiofrequency ablation      for WPW  . Video bronchoscopy with endobronchial navigation N/A 03/23/2015    Procedure: VIDEO BRONCHOSCOPY WITH  ENDOBRONCHIAL NAVIGATION;  Surgeon: Collene Gobble, MD;  Location: [facility] OR;  Service: Thoracic;  Laterality: N/A;  . Multiple extractions with alveoloplasty N/A 05/11/2015    Procedure: Extraction of tooth #'s 2,5,6,7,8,11,12,17, 20,21,22,23,24,25,26,27,29 with alveoloplasty;  Surgeon: Lenn Cal, DDS;  Location: [facility] ORS;  Service: Oral Surgery;  Laterality: N/A;  . Genital modification N/A   . Embolectomy Bilateral 05/21/2015    Procedure: Bilateral Femoral Endarterectomies with Bovine Patch Angioplasties; Bilateral Femoral Thrombectomies; Left to Right Femoral to Femoral Bypass Graft; Intraop Arteriograms times two;  Surgeon: Angelia Mould, MD;  Location: [facility] OR;  Service: Vascular;  Laterality: Bilateral;   Family History  Problem Relation Age of Onset  . Diabetes Mother   . Depression Mother   . Coronary artery disease Father 49  . Lung cancer Father   . Cancer Father   . Colon cancer Mother   . Bladder Cancer Brother   . Esophageal cancer Brother    Social History  Substance Use Topics  . Smoking status: Former Smoker -- 2.00 packs/day for 47 years    Types: Cigarettes    Quit date: 05/02/2015  . Smokeless tobacco: Never Used  . Alcohol Use: 2.4 oz/week    4 Cans of beer, 0 Standard drinks or equivalent per week     Comment: Drinks 2  beers daily- quit 04/2015     Review of Systems  All other systems reviewed and are negative.     Allergies  Codeine and Percocet  Home Medications   Prior to Admission medications   Medication Sig Start Date End Date Taking? Authorizing Provider  aspirin 81 MG tablet Take 81 mg by mouth daily.     Yes Historical Provider, MD  atorvastatin (LIPITOR) 80 MG tablet Take 40 mg by mouth daily at 6 PM.   Yes Historical Provider, MD  Cholecalciferol 2000 UNITS TABS Take 2,000 Units by mouth daily.   Yes Historical Provider, MD  ENSURE (ENSURE) Take 237 mLs by mouth 3 (three) times daily.   Yes Historical Provider, MD  fentaNYL  (DURAGESIC - DOSED MCG/HR) 25 MCG/HR patch Place 1 patch (25 mcg total) onto the skin every 3 (three) days. 05/18/15  Yes Curt Bears, MD  Fish Oil OIL Take 1,000 mg by mouth 2 (two) times daily.    Yes Historical Provider, MD  folic acid (FOLVITE) 1 MG tablet Take 1 tablet (1 mg total) by mouth daily. 05/16/15  Yes Carlton Adam, PA-C  HYDROcodone-acetaminophen (NORCO) 5-325 MG per tablet Take 1-2 tablets by mouth every 6 (six) hours as needed (Break thru pain). 05/19/15  Yes Susanne Borders, NP  lisinopril (PRINIVIL,ZESTRIL) 40 MG tablet Take 20 mg daily. Patient taking differently: Take 10 mg by mouth daily. Take 20 mg daily. 03/23/15  Yes Collene Gobble, MD  loratadine (CLARITIN) 10 MG tablet Take 10 mg by mouth daily as needed for allergies.    Yes Historical Provider, MD  metFORMIN (GLUCOPHAGE) 500 MG tablet Take 500 mg by mouth 2 (two) times daily with a meal. Not taking due to not eating   Yes Historical Provider, MD  metoprolol (LOPRESSOR) 50 MG tablet Take 50 mg by mouth 2 (two) times daily.    Yes Historical Provider, MD  Multiple Vitamin (MULTIVITAMIN) tablet Take 1 tablet by mouth daily.   Yes Historical Provider, MD  ondansetron (ZOFRAN) 8 MG tablet Take 8 mg by mouth every 8 (eight) hours as needed for nausea or vomiting.    Yes Historical Provider, MD  polyethylene glycol (MIRALAX / GLYCOLAX) packet Take 17 g by mouth 2 (two) times daily as needed for severe constipation.   Yes Historical Provider, MD  tamsulosin (FLOMAX) 0.4 MG CAPS capsule Take 0.4 mg by mouth daily.   Yes Historical Provider, MD  traMADol (ULTRAM) 50 MG tablet Take 1 tablet (50 mg total) by mouth every 6 (six) hours as needed. 05/16/15  Yes Adrena E Johnson, PA-C  dexamethasone (DECADRON) 4 MG tablet Take 1 tablet by mouth twice a day, the day before, the day of and day after chemotheapy Patient not taking: Reported on 05/19/2015 05/16/15   Adrena E Johnson, PA-C  insulin regular (NOVOLIN R,HUMULIN R) 100 units/mL  injection Inject 20 Units into the skin every morning. Due to not eating patient not taking insulin    Historical Provider, MD   BP 137/66 mmHg  Pulse 117  Temp(Src) 97.4 F (36.3 C) (Oral)  Resp 28  Ht '5\' 7"'$  (1.702 m)  Wt 127 lb 10.3 oz (57.9 kg)  BMI 19.99 kg/m2  SpO2 92% Physical Exam  Constitutional: He appears well-developed and well-nourished. He appears distressed.  HENT:  Head: Normocephalic and atraumatic.  Mouth/Throat: Oropharynx is clear and moist. No oropharyngeal exudate.  Eyes: Conjunctivae and EOM are normal. Pupils are equal, round, and reactive to light. Right eye  exhibits no discharge. Left eye exhibits no discharge. No scleral icterus.  Neck: Normal range of motion. Neck supple. No JVD present. No thyromegaly present.  Cardiovascular: Normal rate, regular rhythm and normal heart sounds.  Exam reveals no gallop and no friction rub.   No murmur heard. Absent pulses at the bilateral feet, no capillary refill on either side  Pulmonary/Chest: He is in respiratory distress. He has no wheezes. He has rales.  Abdominal: Soft. Bowel sounds are normal. He exhibits no distension and no mass. There is no tenderness.  Musculoskeletal: Normal range of motion. He exhibits no edema or tenderness.  The patient has 4 out of 5 strength on the left at the hip flexor, knee flexor and extensor and ankle. The left leg is cold and mottled and pulseless  Lymphadenopathy:    He has no cervical adenopathy.  Neurological: He is alert. Coordination normal.  Asymmetric strength of the lower extremities right stronger than left.  Skin: Skin is warm and dry. No rash noted. No erythema.  Psychiatric: He has a normal mood and affect. His behavior is normal.  Nursing note and vitals reviewed.   ED Course  Procedures (including critical care time) Labs Review Labs Reviewed  CBC WITH DIFFERENTIAL/PLATELET - Abnormal; Notable for the following:    Neutrophils Relative % 82 (*)    Lymphocytes  Relative 9 (*)    All other components within normal limits  COMPREHENSIVE METABOLIC PANEL - Abnormal; Notable for the following:    Sodium 128 (*)    Chloride 94 (*)    Glucose, Bld 242 (*)    BUN 30 (*)    Albumin 3.0 (*)    All other components within normal limits  URINALYSIS, ROUTINE W REFLEX MICROSCOPIC (NOT AT [facility]) - Abnormal; Notable for the following:    APPearance CLOUDY (*)    Glucose, UA 100 (*)    All other components within normal limits  CBC - Abnormal; Notable for the following:    RBC 3.83 (*)    Hemoglobin 11.3 (*)    HCT 33.0 (*)    All other components within normal limits  GLUCOSE, CAPILLARY - Abnormal; Notable for the following:    Glucose-Capillary 135 (*)    All other components within normal limits  BASIC METABOLIC PANEL - Abnormal; Notable for the following:    Sodium 131 (*)    Chloride 99 (*)    CO2 20 (*)    Glucose, Bld 175 (*)    Calcium 8.4 (*)    All other components within normal limits  CBC - Abnormal; Notable for the following:    RBC 3.89 (*)    Hemoglobin 11.3 (*)    HCT 34.0 (*)    All other components within normal limits  GLUCOSE, CAPILLARY - Abnormal; Notable for the following:    Glucose-Capillary 165 (*)    All other components within normal limits  GLUCOSE, CAPILLARY - Abnormal; Notable for the following:    Glucose-Capillary 162 (*)    All other components within normal limits  GLUCOSE, CAPILLARY - Abnormal; Notable for the following:    Glucose-Capillary 139 (*)    All other components within normal limits  TROPONIN I - Abnormal; Notable for the following:    Troponin I 0.49 (*)    All other components within normal limits  TROPONIN I - Abnormal; Notable for the following:    Troponin I 0.50 (*)    All other components within normal limits  GLUCOSE, CAPILLARY -  Abnormal; Notable for the following:    Glucose-Capillary 179 (*)    All other components within normal limits  GLUCOSE, CAPILLARY - Abnormal; Notable for the  following:    Glucose-Capillary 181 (*)    All other components within normal limits  GLUCOSE, CAPILLARY - Abnormal; Notable for the following:    Glucose-Capillary 154 (*)    All other components within normal limits  I-STAT CG4 LACTIC ACID, ED - Abnormal; Notable for the following:    Lactic Acid, Venous 2.33 (*)    All other components within normal limits  I-STAT CHEM 8, ED - Abnormal; Notable for the following:    Sodium 127 (*)    Chloride 97 (*)    BUN 29 (*)    Glucose, Bld 243 (*)    All other components within normal limits  CBG MONITORING, ED - Abnormal; Notable for the following:    Glucose-Capillary 256 (*)    All other components within normal limits  CBG MONITORING, ED - Abnormal; Notable for the following:    Glucose-Capillary 150 (*)    All other components within normal limits  URINE CULTURE  MRSA PCR SCREENING  CULTURE, EXPECTORATED SPUTUM-ASSESSMENT  CREATININE, SERUM  STREP PNEUMONIAE URINARY ANTIGEN  LACTIC ACID, PLASMA  PROCALCITONIN  TSH  HEMOGLOBIN A1C  LEGIONELLA ANTIGEN, URINE  TROPONIN I  CBC  COMPREHENSIVE METABOLIC PANEL  MAGNESIUM  I-STAT CG4 LACTIC ACID, ED  ABO/RH  PREPARE RBC (CROSSMATCH)  TYPE AND SCREEN    Imaging Review Ct Head Wo Contrast  05/22/2015   CLINICAL DATA:  Confusion.  EXAM: CT HEAD WITHOUT CONTRAST  TECHNIQUE: Contiguous axial images were obtained from the base of the skull through the vertex without intravenous contrast.  COMPARISON:  MRI brain 05/05/2015  FINDINGS: Mild diffuse cerebral atrophy. Patchy low-attenuation changes in the deep white matter. This is likely due to small vessel ischemia. No mass effect or midline shift. No abnormal extra-axial fluid collections. Gray-white matter junctions are distinct. Basal cisterns are not effaced. No evidence of acute intracranial hemorrhage. No depressed skull fractures. Visualized paranasal sinuses and mastoid air cells are not opacified. Vascular calcifications.  IMPRESSION:  No acute intracranial abnormalities. Mild chronic atrophy and small vessel ischemic changes.   Electronically Signed   By: Lucienne Capers M.D.   On: 05/22/2015 18:27   Dg Ang/ext/uni/or Right  05/22/2015   CLINICAL DATA:  64 year old male with a history of bilateral femoral embolectomy with patch angioplasty.  EXAM: LEFT ANG/EXT/UNI/ OR  CONTRAST:  Op note  FLUOROSCOPY TIME:  Op note  COMPARISON:  No comparison available  FINDINGS: Intraoperative spot images during bilateral femoral embolectomy and patch angioplasty.  Left:  Single image during lower extremity angiogram demonstrates partial opacification of the leg vasculature. Surgical clips present along the medial knee.  Right:  Single image during lower extremity angiogram demonstrates partial opacification of the leg vasculature.  IMPRESSION: Intraoperative angiogram of the left and right lower extremity, with incomplete opacification of the leg vasculature bilaterally.  Please refer to the dictated operative report for full details of intraoperative findings and procedure.  Signed,  Dulcy Fanny. Earleen Newport, DO  [facility]  [facility]   Electronically Signed   By: Corrie Mckusick D.O.   On: 05/22/2015 08:06   Dg Chest Port 1 View  05/21/2015   CLINICAL DATA:  Hypoxia, history of stage IV lung cancer with metastatic disease  EXAM: PORTABLE CHEST - 1 VIEW  COMPARISON:  03/23/2015, 04/07/2015  FINDINGS:  Cardiac shadow is stable. Persistent right apical mass lesion is noted consistent with the patient's given clinical history. Patchy infiltrate is noted in the right lung base increased from the prior exam. Additionally diffuse interstitial changes are noted which may represent lymphangitic spread of carcinoma. No sizable effusion is seen. No acute bony abnormality is noted.  IMPRESSION: Stable right apical mass.  Patchy right basilar infiltrate which may represent acute pneumonia.  Diffuse interstitial changes likely  related to the underlying neoplasm.   Electronically Signed   By: Inez Catalina M.D.   On: 05/21/2015 08:00   Dg Abd Acute W/chest  05/22/2015   CLINICAL DATA:  Constipation.  Shortness of breath.  Abdominal pain.  EXAM: DG ABDOMEN ACUTE W/ 1V CHEST  COMPARISON:  None.  FINDINGS: There is a moderate to large amount of fecal matter throughout the colon. Small bowel gas pattern is normal. No visible free air. No worrisome calcifications or acute bony findings. Calcification of the aorta is noted. Previous ORIF of right hip fracture.  One-view chest again shows widespread patchy density and a right apical mass. Patchy pulmonary infiltrate is worsened in general, particularly at the right lung base.  IMPRESSION: Large amount of fecal matter in the colon consistent with the clinical diagnosis of constipation. No evidence of small bowel obstruction or perforation.  Widespread patchy pulmonary densities consistent with pneumonia, somewhat progressive in the right lower lobe.   Electronically Signed   By: Nelson Chimes M.D.   On: 05/22/2015 13:46   Dg Ang/ext/uni/or Left  05/22/2015   CLINICAL DATA:  64 year old male with a history of bilateral femoral embolectomy with patch angioplasty.  EXAM: LEFT ANG/EXT/UNI/ OR  CONTRAST:  Op note  FLUOROSCOPY TIME:  Op note  COMPARISON:  No comparison available  FINDINGS: Intraoperative spot images during bilateral femoral embolectomy and patch angioplasty.  Left:  Single image during lower extremity angiogram demonstrates partial opacification of the leg vasculature. Surgical clips present along the medial knee.  Right:  Single image during lower extremity angiogram demonstrates partial opacification of the leg vasculature.  IMPRESSION: Intraoperative angiogram of the left and right lower extremity, with incomplete opacification of the leg vasculature bilaterally.  Please refer to the dictated operative report for full details of intraoperative findings and procedure.  Signed,   Dulcy Fanny. Earleen Newport, DO  [facility]  [facility]   Electronically Signed   By: Corrie Mckusick D.O.   On: 05/22/2015 08:06   I have personally reviewed and evaluated these images and lab results as part of my medical decision-making.   EKG Interpretation None      MDM   Final diagnoses:  Leg pain  CAP (community acquired pneumonia)  Arterial occlusion  Pulseless disease    At 7:20 AM, the case was discussed with Dr. Gae Gallop, he will provide consultation for the patient when he arrives at [facility]. He recommends that the patient be admitted to a medical service. He recommends Dopplers of the legs, laboratory workup, pain control, hydration. The patient is critically ill with vascular occlusions of his legs.  The xray shows diffuse pulmonary infiltrates - this correlates with his hypoxia - tx with abx 0- possible sepsis  D/W Dr. Doyle Askew who will come to ED to see the pt  The patient appears significantly critically ill, we'll obtain Dopplers, pain medications, transfer to [facility] where he can be attended to by a vascular surgeon. Critical care being provided.  CRITICAL CARE Performed  by: Katey Barrie D Total critical care time: 35 Critical care time was exclusive of separately billable procedures and treating other patients. Critical care was necessary to treat or prevent imminent or life-threatening deterioration. Critical care was time spent personally by me on the following activities: development of treatment plan with patient and/or surrogate as well as nursing, discussions with consultants, evaluation of patient's response to treatment, examination of patient, obtaining history from patient or surrogate, ordering and performing treatments and interventions, ordering and review of laboratory studies, ordering and review of radiographic studies, pulse oximetry and re-evaluation of patient's condition.   Noemi Chapel, MD 05/22/15 [phone]

## 2015-05-21 NOTE — ED Notes (Signed)
MD  Sabra Heck aware of iStat lactic acid value

## 2015-05-22 ENCOUNTER — Encounter (HOSPITAL_COMMUNITY): Payer: Self-pay | Admitting: Radiology

## 2015-05-22 ENCOUNTER — Inpatient Hospital Stay (HOSPITAL_COMMUNITY): Payer: Medicare PPO

## 2015-05-22 DIAGNOSIS — C7989 Secondary malignant neoplasm of other specified sites: Secondary | ICD-10-CM

## 2015-05-22 DIAGNOSIS — C3490 Malignant neoplasm of unspecified part of unspecified bronchus or lung: Secondary | ICD-10-CM | POA: Diagnosis present

## 2015-05-22 LAB — CBC
HEMATOCRIT: 34 % — AB (ref 39.0–52.0)
HEMOGLOBIN: 11.3 g/dL — AB (ref 13.0–17.0)
MCH: 29 pg (ref 26.0–34.0)
MCHC: 33.2 g/dL (ref 30.0–36.0)
MCV: 87.4 fL (ref 78.0–100.0)
Platelets: 230 10*3/uL (ref 150–400)
RBC: 3.89 MIL/uL — AB (ref 4.22–5.81)
RDW: 13.2 % (ref 11.5–15.5)
WBC: 6.8 10*3/uL (ref 4.0–10.5)

## 2015-05-22 LAB — URINE CULTURE: Culture: NO GROWTH

## 2015-05-22 LAB — GLUCOSE, CAPILLARY
GLUCOSE-CAPILLARY: 181 mg/dL — AB (ref 65–99)
Glucose-Capillary: 139 mg/dL — ABNORMAL HIGH (ref 65–99)
Glucose-Capillary: 179 mg/dL — ABNORMAL HIGH (ref 65–99)
Glucose-Capillary: 154 mg/dL — ABNORMAL HIGH (ref 65–99)

## 2015-05-22 LAB — BASIC METABOLIC PANEL
ANION GAP: 12 (ref 5–15)
BUN: 20 mg/dL (ref 6–20)
CO2: 20 mmol/L — ABNORMAL LOW (ref 22–32)
Calcium: 8.4 mg/dL — ABNORMAL LOW (ref 8.9–10.3)
Chloride: 99 mmol/L — ABNORMAL LOW (ref 101–111)
Creatinine, Ser: 0.93 mg/dL (ref 0.61–1.24)
GFR calc Af Amer: >60 mL/min (ref >60)
Glucose, Bld: 175 mg/dL — ABNORMAL HIGH (ref 65–99)
POTASSIUM: 4.6 mmol/L (ref 3.5–5.1)
SODIUM: 131 mmol/L — AB (ref 135–145)

## 2015-05-22 LAB — EXPECTORATED SPUTUM ASSESSMENT W REFEX TO RESP CULTURE

## 2015-05-22 LAB — TSH: TSH: 0.814 u[IU]/mL (ref 0.350–4.500)

## 2015-05-22 LAB — TROPONIN I
TROPONIN I: 0.49 ng/mL — AB (ref <0.031)
Troponin I: 0.5 ng/mL (ref <0.031)

## 2015-05-22 MED ORDER — POLYETHYLENE GLYCOL 3350 17 G PO PACK
17.0000 g | PACK | Freq: Two times a day (BID) | ORAL | Status: DC
  Administered 2015-05-22 – 2015-05-25 (×7): 17 g via ORAL
  Filled 2015-05-22 (×10): qty 1

## 2015-05-22 MED ORDER — SODIUM CHLORIDE 0.9 % IV BOLUS (SEPSIS): 1000.0000 mL | INTRAVENOUS | Status: DC | PRN

## 2015-05-22 MED ORDER — BISACODYL 5 MG PO TBEC
10.0000 mg | DELAYED_RELEASE_TABLET | Freq: Every day | ORAL | Status: DC
  Administered 2015-05-22 – 2015-05-24 (×3): 10 mg via ORAL
  Filled 2015-05-22 (×4): qty 2

## 2015-05-22 MED ORDER — VANCOMYCIN HCL IN DEXTROSE 1-5 GM/200ML-% IV SOLN
1000.0000 mg | Freq: Once | INTRAVENOUS | Status: AC
  Administered 2015-05-22: 1000 mg via INTRAVENOUS
  Filled 2015-05-22: qty 200

## 2015-05-22 MED ORDER — WHITE PETROLATUM GEL
Status: AC
  Administered 2015-05-22: 05:00:00
  Filled 2015-05-22: qty 1

## 2015-05-22 MED ORDER — HALOPERIDOL LACTATE 5 MG/ML IJ SOLN
2.0000 mg | Freq: Four times a day (QID) | INTRAMUSCULAR | Status: DC | PRN
  Administered 2015-05-25 – 2015-05-26 (×2): 2 mg via INTRAVENOUS
  Filled 2015-05-22 (×2): qty 1

## 2015-05-22 MED ORDER — VANCOMYCIN HCL IN DEXTROSE 750-5 MG/150ML-% IV SOLN
750.0000 mg | Freq: Two times a day (BID) | INTRAVENOUS | Status: DC
  Administered 2015-05-22 – 2015-05-26 (×7): 750 mg via INTRAVENOUS
  Filled 2015-05-22 (×9): qty 150

## 2015-05-22 MED ORDER — METOPROLOL TARTRATE 25 MG PO TABS
25.0000 mg | ORAL_TABLET | Freq: Two times a day (BID) | ORAL | Status: DC
Start: 2015-05-22 — End: 2015-05-23
  Administered 2015-05-22 – 2015-05-23 (×3): 25 mg via ORAL
  Filled 2015-05-22 (×4): qty 1

## 2015-05-22 MED ORDER — LEVALBUTEROL HCL 1.25 MG/0.5ML IN NEBU
1.2500 mg | INHALATION_SOLUTION | Freq: Three times a day (TID) | RESPIRATORY_TRACT | Status: DC
  Administered 2015-05-23 – 2015-05-24 (×6): 1.25 mg via RESPIRATORY_TRACT
  Filled 2015-05-22 (×7): qty 0.5

## 2015-05-22 MED ORDER — PNEUMOCOCCAL VAC POLYVALENT 25 MCG/0.5ML IJ INJ
0.5000 mL | INJECTION | INTRAMUSCULAR | Status: DC
  Filled 2015-05-22: qty 0.5

## 2015-05-22 MED ORDER — DOCUSATE SODIUM 100 MG PO CAPS
200.0000 mg | ORAL_CAPSULE | Freq: Two times a day (BID) | ORAL | Status: DC
  Administered 2015-05-22 – 2015-05-24 (×6): 200 mg via ORAL
  Filled 2015-05-22 (×6): qty 2

## 2015-05-22 MED ORDER — METOPROLOL TARTRATE 1 MG/ML IV SOLN
5.0000 mg | INTRAVENOUS | Status: DC | PRN
Start: 2015-05-22 — End: 2015-05-25
  Administered 2015-05-22 – 2015-05-24 (×4): 5 mg via INTRAVENOUS
  Filled 2015-05-22 (×8): qty 5

## 2015-05-22 MED ORDER — PIPERACILLIN-TAZOBACTAM 3.375 G IVPB
3.3750 g | Freq: Three times a day (TID) | INTRAVENOUS | Status: DC
  Administered 2015-05-22 – 2015-05-26 (×12): 3.375 g via INTRAVENOUS
  Filled 2015-05-22 (×14): qty 50

## 2015-05-22 MED ORDER — LORAZEPAM 2 MG/ML IJ SOLN
1.0000 mg | INTRAMUSCULAR | Status: DC | PRN
  Administered 2015-05-22: 1 mg via INTRAVENOUS
  Filled 2015-05-22: qty 1

## 2015-05-22 MED ORDER — APIXABAN 5 MG PO TABS
5.0000 mg | ORAL_TABLET | Freq: Two times a day (BID) | ORAL | Status: DC
  Administered 2015-05-22 – 2015-05-26 (×7): 5 mg via ORAL
  Filled 2015-05-22 (×9): qty 1

## 2015-05-22 MED ORDER — IPRATROPIUM-ALBUTEROL 0.5-2.5 (3) MG/3ML IN SOLN
3.0000 mL | RESPIRATORY_TRACT | Status: DC | PRN
  Administered 2015-05-25: 3 mL via RESPIRATORY_TRACT
  Filled 2015-05-22 (×3): qty 3

## 2015-05-22 NOTE — Evaluation (Signed)
Physical Therapy Evaluation Patient Details Name: Mark Hurst MRN: [id number] DOB: Jan 14, 1951 Today's Date: 05/22/2015   History of Present Illness    64 yo male with HTN, HLD, DM, long history of smoking 2 ppd, recently diagnosed NSLC metastatic to the spine, mediastinum, lymph nodes, history of A-fib (has been on Eliquis but was on hold per pt due to requiring procedures but pt not sure which ones)) follows with Dr. Julien Nordmann, no presented to [facility] ED with main concern of one week duration of progressively worsening bilateral LE pain, LLE > RLE, constant and sharp/10/10 in severity, non radiating, worse with ambulation and even minimal movement, no specific alleviating factors, associated with cold extremities and numbness, malaise.   Underwent Bilateral common femoral artery endarterectomies with bovine pericardial patch angioplasties; Bilateral femoral embolectomies; Left to right femorofemoral bypass graft (8 mm Dacron graft); Intraoperative arteriogram 2 for LE ischemia on 05/21/2015.    Clinical Impression  Attempted mobility evaluation, but pt limited by agitation/confusion.  See below for exam findings, expect once pt's mentation clears a new picture will emerge. PT will initiate care in acute setting, reassess mobility next visit, and update recommendations then.     Follow Up Recommendations Home health PT (tbd once medication reaction clears)    Equipment Recommendations  None recommended by PT    Recommendations for Other Services       Precautions / Restrictions Precautions Precautions: Fall Precaution Comments: up with assist      Mobility  Bed Mobility Overal bed mobility: Needs Assistance Bed Mobility: Supine to Sit;Sit to Supine     Supine to sit: Mod assist Sit to supine: Mod assist   General bed mobility comments: physically independent but huge safety risk due to lack of attention to lines/leads, bed rail, and general safety  Transfers Overall transfer  level: Needs assistance Equipment used: 2 person hand held assist Transfers: Sit to/from [employer] Transfers Sit to Stand: Mod assist;+2 safety/equipment Stand pivot transfers: Mod assist;+2 safety/equipment       General transfer comment: impulsive, lurching forward, able to express need for bathroom but leaking BM; assist x2 for safety and to obtain necessary equipment.  Pt does stand without assist but also without judgment  Ambulation/Gait Ambulation/Gait assistance:  (deferred)              Stairs            Wheelchair Mobility    Modified Rankin (Stroke Patients Only)       Balance Overall balance assessment:  (tbd)                                           Pertinent Vitals/Pain Pain Assessment: Faces Pain Score: 2     Home Living Family/patient expects to be discharged to:: Private residence Living Arrangements: Non-relatives/Friends Available Help at Discharge: Family;Available PRN/intermittently Type of Home: House Home Access: Stairs to enter Entrance Stairs-Rails: Right Entrance Stairs-Number of Steps: 2 Home Layout: One level   Additional Comments: information obtained from previous encounter    Prior Function Level of Independence: Independent               Hand Dominance        Extremity/Trunk Assessment   Upper Extremity Assessment: Overall WFL for tasks assessed           Lower Extremity Assessment: Overall WFL for tasks assessed  Cervical / Trunk Assessment: Normal  Communication   Communication: No difficulties (confused)  Cognition Arousal/Alertness: Awake/alert;Suspect due to medications (per nurse, reaction to ativan) Behavior During Therapy: Agitated;Impulsive Overall Cognitive Status: Impaired/Different from baseline Area of Impairment: Following commands;Safety/judgement;Awareness     Memory: Decreased short-term memory Following Commands: Follows one step commands  inconsistently Safety/Judgement: Decreased awareness of safety;Decreased awareness of deficits Awareness:  (none)   General Comments: pt agitated, crawling out of bed, pulling off O2 tubing and SaO2 monitor; asking for family, for cigarettes, to leave; urgent need to have BM, pt stands, leaking BM, sits on [facility] with x2 assist, urinates on floor, attempts to get up again.  Session terminated.  See mobility for current level but read with caveat that pt likely with abnormal cognition due to medication    General Comments      Exercises        Assessment/Plan    PT Assessment Patient needs continued PT services  PT Diagnosis Difficulty walking   PT Problem List Decreased cognition;Decreased mobility;Decreased balance;Decreased coordination;Decreased safety awareness;Cardiopulmonary status limiting activity;Decreased activity tolerance  PT Treatment Interventions DME instruction;Gait training;Functional mobility training;Stair training;Patient/family education;Therapeutic exercise;Therapeutic activities   PT Goals (Current goals can be found in the Care Plan section) Acute Rehab PT Goals PT Goal Formulation: Patient unable to participate in goal setting Time For Goal Achievement: 05/29/15 Potential to Achieve Goals: Fair    Frequency Min 3X/week   Barriers to discharge        Co-evaluation               End of Session Equipment Utilized During Treatment: Gait belt;Oxygen Activity Tolerance: Treatment limited secondary to agitation Patient left: in bed;with nursing/sitter in room Nurse Communication: Mobility status         Time: 8366-2947 PT Time Calculation (min) (ACUTE ONLY): 21 min   Charges:   PT Evaluation $Initial PT Evaluation Tier I: 1 Procedure     PT G Codes:        Herbie Drape 05/22/2015, 5:31 PM

## 2015-05-22 NOTE — Progress Notes (Signed)
Utilization Review Completed.Narely Nobles T8/21/2016  

## 2015-05-22 NOTE — Progress Notes (Signed)
Respiratory protocol done and patient scored a 7. Patients history with smoking and current X ray suggest TID treatments at this time and will be reevaluated in three days by respiratory team.

## 2015-05-22 NOTE — Progress Notes (Signed)
   VASCULAR SURGERY ASSESSMENT & PLAN:  * 1 Day Post-Op s/p:   1. Bilateral common femoral artery endarterectomies with bovine pericardial patch angioplasties  2. Bilateral femoral embolectomies  3. Left to right femorofemoral bypass graft (8 mm Dacron graft)  4. Intraoperative arteriogram 2  *  Right leg with brisk flow. Left leg has PT signal with doppler. He was likely ischemic for 2 days. Time will tell if the left leg is salvageable. Ok to ambulate. Will likely need PTx.   * Management of DM, hyponatremia, malnutrition per Medicine.   SUBJECTIVE: Pain better both LE's.  PHYSICAL EXAM: Filed Vitals:   05/22/15 0354 05/22/15 0400 05/22/15 0500 05/22/15 0545  BP:  100/52 106/59   Pulse:  112 119   Temp:      TempSrc:      Resp:  18 28   Height:      Weight: 127 lb 10.3 oz (57.9 kg)     SpO2:  97% 94% 92%   Brisk DP and PT signal on right Monophasic PT signal on left Incisions OK Palpable fem-fem graft pulse.   LABS: Lab Results  Component Value Date   WBC 6.8 05/22/2015   HGB 11.3* 05/22/2015   HCT 34.0* 05/22/2015   MCV 87.4 05/22/2015   PLT 230 05/22/2015   Lab Results  Component Value Date   CREATININE 0.90 05/21/2015   Lab Results  Component Value Date   INR 0.96 03/23/2015   CBG (last 3)   Recent Labs  05/21/15 1355 05/21/15 1829 05/21/15 2215  GLUCAP 135* 165* 162*    Active Problems:   Cold extremities   Gae Gallop Beeper: [phone] 05/22/2015

## 2015-05-22 NOTE — Care Management Note (Signed)
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   Action/Plan: Discharge planning.  Expected Discharge Date:                  Expected Discharge Plan:  Home/Self Care  In-House Referral:     Discharge planning Services  CM Consult  Post Acute Care Choice:    Choice offered to:     DME Arranged:    DME Agency:     HH Arranged:    HH Agency:     Status of Service:  In process, will continue to follow  Medicare Important Message Given:    Date Medicare IM Given:    Medicare IM give by:    Date Additional Medicare IM Given:    Additional Medicare Important Message give by:     If discussed at [place] of Stay Meetings, dates discussed:    Additional CommentsLlana Aliment (Other)  [phone]  [address] [place], [place] [phone] 05/22/2015, 2:45 PM

## 2015-05-22 NOTE — Discharge Instructions (Addendum)
Follow with Primary MD [name], MD in 7 days   Get CBC, CMP, 2 view Chest X ray checked  by Primary MD next visit.    Activity: As tolerated with Full fall precautions use walker/cane & assistance as needed   Disposition Home     Diet: Soft diet with feeding assistance and aspiration precautions.  For Heart failure patients - Check your Weight same time everyday, if you gain over 2 pounds, or you develop in leg swelling, experience more shortness of breath or chest pain, call your Primary MD immediately. Follow Cardiac Low Salt Diet and 1.5 lit/day fluid restriction.   On your next visit with your primary care physician please Get Medicines reviewed and adjusted.   Please request your Prim.MD to go over all Hospital Tests and Procedure/Radiological results at the follow up, please get all Hospital records sent to your Prim MD by signing hospital release before you go home.   If you experience worsening of your admission symptoms, develop shortness of breath, life threatening emergency, suicidal or homicidal thoughts you must seek medical attention immediately by calling 911 or calling your MD immediately  if symptoms less severe.  You Must read complete instructions/literature along with all the possible adverse reactions/side effects for all the Medicines you take and that have been prescribed to you. Take any new Medicines after you have completely understood and accpet all the possible adverse reactions/side effects.   Do not drive, operating heavy machinery, perform activities at heights, swimming or participation in water activities or provide baby sitting services if your were admitted for syncope or siezures until you have seen by Primary MD or a Neurologist and advised to do so again.  Do not drive when taking Pain medications.    Do not take more than prescribed Pain, Sleep and Anxiety Medications  Special Instructions: If you have smoked or chewed Tobacco  in the last  2 yrs please stop smoking, stop any regular Alcohol  and or any Recreational drug use.  Wear Seat belts while driving.   Please note  You were cared for by a hospitalist during your hospital stay. If you have any questions about your discharge medications or the care you received while you were in the hospital after you are discharged, you can call the unit and asked to speak with the hospitalist on call if the hospitalist that took care of you is not available. Once you are discharged, your primary care physician will handle any further medical issues. Please note that NO REFILLS for any discharge medications will be authorized once you are discharged, as it is imperative that you return to your primary care physician (or establish a relationship with a primary care physician if you do not have one) for your aftercare needs so that they can reassess your need for medications and monitor your lab values.

## 2015-05-22 NOTE — Progress Notes (Signed)
CRITICAL VALUE ALERT  Critical value received:  Troponin 0.50  Date of notification:  05/22/15  Time of notification:  2000  Critical value read back:Yes.    Nurse who received alert:  D. Aleene Davidson, RN  MD notified (1st page):  L. Harduk  Time of first page:  2003  MD notified (2nd page): L. Harduk  Time of second page: 2033  Responding MD:  Roger Shelter, NP  Time MD responded:  2040

## 2015-05-22 NOTE — Progress Notes (Signed)
Patient has slight blue discoloration of the finger tips in left hand which are mildly colder.  Patient has a pulse in the wrist and radial.  Dr. Scot Dock updated on patients condition.  Will continue to monitor.

## 2015-05-22 NOTE — Progress Notes (Signed)
Patient Demographics:    Mark Hurst, is a 64 y.o. male, DOB - 04/05/1951, XBM:[id number]  Admit date - 05/21/2015   Admitting Physician Theodis Blaze, MD  Outpatient Primary MD for the patient is [name], MD  LOS - 1   Chief Complaint  Patient presents with  . Leg Pain  . Constipation        Subjective:    Mark Hurst today has, No headache, No chest pain, No abdominal pain - No Nausea, No new weakness tingling or numbness, No Cough - SOB. Chronic left leg weakness.   Assessment  & Plan :     1. Bilateral femoral artery embolization with ischemia - he is post by lateral femoral embolectomy by Dr. Doren Custard on 05/22/2015, has chronic left leg weakness for which PT and OT will be consulted. Continue supportive care. Likely embolization due to underlying history of paroxysmal atrial fibrillation along with lung cancer with hypercoagulable state.   2. Paroxysmal atrial fibrillation. [place] Vasc 2 score now least greater than 4. Embolization to bilateral femoral artery, will place on Lopressor along with Eliquis to be dosed by pharmacy. He was on Eliquis before but was on hold for a scheduled biopsy procedure per patient.   3. 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 Follows with Dr. Julien Nordmann. He is to undergo palliative chemotherapy in the future. Dr. Julien Nordmann added to the treatment team.   4. Sepsis due Aspiration HCAP. Placed on appropriate anti-biotics. Monitor cultures, supportive care with oxygen and nebulizer treatments as needed.   5. Ongoing smoking. Counseled to quit.   6. Essential hypertension. On ACE inhibitor, added low-dose Lopressor for better control.   7. Dyslipidemia. Continue home dose statin.    8.  Constipation. Placed on bowel regimen.    9. DM type II. Currently on sliding scale continue and monitor Y Stimac control.  CBG (last 3)   Recent Labs  05/21/15 1829 05/21/15 2215 05/22/15 0755  GLUCAP 165* 162* 139*      Code Status : DO NOT RESUSCITATE  Family Communication  : Friend bedside  Disposition Plan  : Likely home in the next 3-4 days  Consults  :  Vascular surgeon Dr. Doren Custard  Procedures  :   By vascular surgeon Dr. Doren Custard on 05/21/2015  1. Bilateral common femoral artery endarterectomies with bovine pericardial patch angioplasties 2. Bilateral femoral embolectomies 3. Left to right femorofemoral bypass graft (8 mm Dacron graft) 4. Intraoperative arteriogram 2    DVT Prophylaxis  :  Lovenox    Lab Results  Component Value Date   PLT 230 05/22/2015    Inpatient Medications  Scheduled Meds: . aspirin  81 mg Oral Daily  . atorvastatin  40 mg Oral q1800  . bisacodyl  10 mg Oral Daily  . docusate sodium  200 mg Oral BID  . enoxaparin (LOVENOX) injection  40 mg Subcutaneous Q24H  . feeding supplement (ENSURE ENLIVE)  237 mL Oral BID BM  . folic acid  1 mg Oral Daily  . insulin aspart  0-5 Units Subcutaneous QHS  . insulin aspart  0-9 Units Subcutaneous TID WC  . levalbuterol  1.25 mg Nebulization 4 times per day  .  lisinopril  10 mg Oral Daily  . metoprolol  25 mg Oral BID  . pantoprazole  40 mg Oral QHS  . piperacillin-tazobactam (ZOSYN)  IV  3.375 g Intravenous 3 times per day  . polyethylene glycol  17 g Oral BID  . tamsulosin  0.4 mg Oral Daily  . vancomycin  1,000 mg Intravenous Once  . vancomycin  750 mg Intravenous Q12H   Continuous Infusions: . sodium chloride 50 mL/hr at 05/21/15 2156   PRN Meds:.alum & mag hydroxide-simeth, guaiFENesin-dextromethorphan, hydrALAZINE, HYDROmorphone (DILAUDID) injection, HYDROmorphone (DILAUDID) injection, ipratropium-albuterol, LORazepam, magnesium sulfate 1 - 4 g bolus IVPB, metoprolol, ondansetron  **OR** ondansetron (ZOFRAN) IV, phenol, potassium chloride, sodium chloride  Antibiotics  :    Anti-infectives    Start     Dose/Rate Route Frequency Ordered Stop   05/22/15 2200  vancomycin (VANCOCIN) IVPB 750 mg/150 ml premix     750 mg 150 mL/hr over 60 Minutes Intravenous Every 12 hours 05/22/15 0945     05/22/15 1400  piperacillin-tazobactam (ZOSYN) IVPB 3.375 g     3.375 g 12.5 mL/hr over 240 Minutes Intravenous 3 times per day 05/22/15 0945     05/22/15 1030  vancomycin (VANCOCIN) IVPB 1000 mg/200 mL premix     1,000 mg 200 mL/hr over 60 Minutes Intravenous  Once 05/22/15 0945     05/21/15 2200  azithromycin (ZITHROMAX) 500 mg in dextrose 5 % 250 mL IVPB  Status:  Discontinued     500 mg 250 mL/hr over 60 Minutes Intravenous Every 24 hours 05/21/15 1354 05/22/15 0945   05/21/15 2100  cefTRIAXone (ROCEPHIN) 1 g in dextrose 5 % 50 mL IVPB  Status:  Discontinued     1 g 100 mL/hr over 30 Minutes Intravenous Every 24 hours 05/21/15 1354 05/22/15 0945   05/21/15 1330  ceFAZolin (ANCEF) IVPB 2 g/50 mL premix    Comments:  Send with pt to OR   2 g 100 mL/hr over 30 Minutes Intravenous To [facility] 05/21/15 1315 05/21/15 1501   05/21/15 0830  piperacillin-tazobactam (ZOSYN) IVPB 3.375 g     3.375 g 100 mL/hr over 30 Minutes Intravenous  Once 05/21/15 0823 05/21/15 0854   05/21/15 0815  piperacillin-tazobactam (ZOSYN) IVPB 3.375 g  Status:  Discontinued     3.375 g 12.5 mL/hr over 240 Minutes Intravenous  Once 05/21/15 0808 05/21/15 0823        Objective:   Filed Vitals:   05/22/15 0545 05/22/15 0743 05/22/15 0853 05/22/15 1150  BP:  135/59    Pulse:  124    Temp:  97.4 F (36.3 C)  97.6 F (36.4 C)  TempSrc:  Oral  Oral  Resp:  21    Height:      Weight:      SpO2: 92% 95% 88%     Wt Readings from Last 3 Encounters:  05/22/15 57.9 kg (127 lb 10.3 oz)  05/11/15 61.689 kg (136 lb)  05/02/15 61.236 kg (135 lb)     Intake/Output Summary (Last 24 hours) at  05/22/15 1200 Last data filed at 05/22/15 0500  Gross per 24 hour  Intake   3210 ml  Output   1070 ml  Net   2140 ml     Physical Exam  Awake Alert, Oriented X 3, No new F.N deficits, Chr L leg weakness, Normal affect [place].AT,PERRAL Supple Neck,No JVD, No cervical lymphadenopathy appriciated.  Symmetrical Chest wall movement, Good air movement bilaterally, CTAB RRR,No Gallops,Rubs or new Murmurs, No  Parasternal Heave +ve B.Sounds, Abd Soft, No tenderness, No organomegaly appriciated, No rebound - guarding or rigidity. No Cyanosis, Clubbing or edema, No new Rash or bruise      Data Review:   Micro Results Recent Results (from the past 240 hour(s))  MRSA PCR Screening     Status: None   Collection Time: 05/21/15 12:38 PM  Result Value Ref Range Status   MRSA by PCR NEGATIVE NEGATIVE Final    Comment:        The GeneXpert MRSA Assay (FDA approved for NASAL specimens only), is one component of a comprehensive MRSA colonization surveillance program. It is not intended to diagnose MRSA infection nor to guide or monitor treatment for MRSA infections.   Culture, sputum-assessment     Status: None   Collection Time: 05/22/15  5:23 AM  Result Value Ref Range Status   Specimen Description SPUTUM  Final   Special Requests NONE  Final   Sputum evaluation   Final    MICROSCOPIC FINDINGS SUGGEST THAT THIS SPECIMEN IS NOT REPRESENTATIVE OF LOWER RESPIRATORY SECRETIONS. PLEASE RECOLLECT. Gram Stain Report Called to,Read Back By and Verified With: B. GROGAN,RN AT [id number] ON [id number] BY Rhea Bleacher    Report Status 05/22/2015 FINAL  Final    Radiology Reports Ct Lumbar Spine Wo Contrast  05/18/2015   CLINICAL DATA:  Low back pain.  Constipation.  EXAM: CT LUMBAR SPINE WITHOUT CONTRAST  TECHNIQUE: Multidetector CT imaging of the lumbar spine was performed without intravenous contrast administration. Multiplanar CT image reconstructions were also generated.  COMPARISON:  PET-CT 04/07/2015   FINDINGS: On scout imaging there is constipation with stool distending the entire colon.  Lumbar spine:  No acute fracture, endplate erosion, or traumatic malalignment. Bone metastases are not appreciated, including a large deposit in the L2 body seen on previous PET-CT.  Right L5 pars defect without slip.  Mild disc bulging at L4-5 and L5-S1 without suspected impingement. Mild moderate left facet arthropathy at L4-5 and L5-S1. No gross canal hematoma.  Small right pleural effusion. Right adrenal thickening, likely metastatic deposit based on previous PET-CT. Abdominal aortic aneurysm with a 34 mm maximal diameter. Attenuated appearance of the right common and external iliac arteries consistent with chronic occlusion.  Pelvis: Status post open reduction internal fixation of the proximal right femur. There is no evidence of periprosthetic fracture or hardware loosening. No acute fracture or diastasis. Metastases within the right pelvis on prior PET-CT are not visible by CT.  Mild osteoarthritic spurring about the sacroiliac joints. No notable hip degenerative change.  Sigmoid diverticulosis.  IMPRESSION: 1. No acute finding in the lumbar spine or pelvis. 2. Osseous metastases seen on PET-CT 04/07/2015 are not visible by CT. 3. Large stool volume. 4. Right pleural effusion is small but increased from July 2016.   Electronically Signed   By: Monte Fantasia M.D.   On: 05/18/2015 02:52   Ct Pelvis Wo Contrast  05/18/2015   CLINICAL DATA:  Low back pain.  Constipation.  EXAM: CT LUMBAR SPINE WITHOUT CONTRAST  TECHNIQUE: Multidetector CT imaging of the lumbar spine was performed without intravenous contrast administration. Multiplanar CT image reconstructions were also generated.  COMPARISON:  PET-CT 04/07/2015  FINDINGS: On scout imaging there is constipation with stool distending the entire colon.  Lumbar spine:  No acute fracture, endplate erosion, or traumatic malalignment. Bone metastases are not appreciated,  including a large deposit in the L2 body seen on previous PET-CT.  Right L5 pars defect without slip.  Mild disc bulging at L4-5 and L5-S1 without suspected impingement. Mild moderate left facet arthropathy at L4-5 and L5-S1. No gross canal hematoma.  Small right pleural effusion. Right adrenal thickening, likely metastatic deposit based on previous PET-CT. Abdominal aortic aneurysm with a 34 mm maximal diameter. Attenuated appearance of the right common and external iliac arteries consistent with chronic occlusion.  Pelvis: Status post open reduction internal fixation of the proximal right femur. There is no evidence of periprosthetic fracture or hardware loosening. No acute fracture or diastasis. Metastases within the right pelvis on prior PET-CT are not visible by CT.  Mild osteoarthritic spurring about the sacroiliac joints. No notable hip degenerative change.  Sigmoid diverticulosis.  IMPRESSION: 1. No acute finding in the lumbar spine or pelvis. 2. Osseous metastases seen on PET-CT 04/07/2015 are not visible by CT. 3. Large stool volume. 4. Right pleural effusion is small but increased from July 2016.   Electronically Signed   By: Monte Fantasia M.D.   On: 05/18/2015 02:52   Mr Jeri Cos HY Contrast  05/05/2015   CLINICAL DATA:  64 year old male with stage IV non-small cell lung cancer diagnosed in June. Staging. Subsequent encounter.  EXAM: MRI HEAD WITHOUT AND WITH CONTRAST  TECHNIQUE: Multiplanar, multiecho pulse sequences of the brain and surrounding structures were obtained without and with intravenous contrast.  CONTRAST:  52m MULTIHANCE GADOBENATE DIMEGLUMINE 529 MG/ML IV SOLN  COMPARISON:  PET-CT 04/07/2015.  FINDINGS: There is a small triangular focus of T2 and FLAIR hyperintensity in the right cerebellum (series 6, image 7) associated with curvilinear postcontrast enhancement (series 11, image 12). However, this more resembles a small area of encephalomalacia than a mass. No associated hemosiderin.  Diffusion here is facilitated.  No other abnormal enhancement identified.  No dural thickening.  Patchy T2 hyperintensity in the pons. No supratentorial encephalomalacia, with normal for age hemispheric gray and white matter signal.  No restricted diffusion to suggest acute infarction. No intracranial mass effect, ventriculomegaly, extra-axial collection or acute intracranial hemorrhage. Cervicomedullary junction and pituitary are within normal limits. Grossly negative visualized cervical spine.  Visualized bone marrow signal is within normal limits. Negative scalp soft tissues. Orbits soft tissues are normal aside from postoperative changes to the globes. Visible internal auditory structures appear normal. Visualized paranasal sinuses and mastoids are clear.  IMPRESSION: 1. A small 7 mm focus of signal abnormality and enhancement in the right cerebellar hemisphere more resembles sequelae of small vessel ischemia or a small benign vascular malformation than a small metastasis. Recommend repeat study to document stability in 3 months, unless clinically indicated sooner. 2. Otherwise no acute or metastatic intracranial process.   Electronically Signed   By: HGenevie AnnM.D.   On: 05/05/2015 14:06   Dg Ang/ext/uni/or Right  05/22/2015   CLINICAL DATA:  64year old male with a history of bilateral femoral embolectomy with patch angioplasty.  EXAM: LEFT ANG/EXT/UNI/ OR  CONTRAST:  Op note  FLUOROSCOPY TIME:  Op note  COMPARISON:  No comparison available  FINDINGS: Intraoperative spot images during bilateral femoral embolectomy and patch angioplasty.  Left:  Single image during lower extremity angiogram demonstrates partial opacification of the leg vasculature. Surgical clips present along the medial knee.  Right:  Single image during lower extremity angiogram demonstrates partial opacification of the leg vasculature.  IMPRESSION: Intraoperative angiogram of the left and right lower extremity, with incomplete opacification  of the leg vasculature bilaterally.  Please refer to the dictated operative report for full details of intraoperative findings and procedure.  Signed,  Dulcy Fanny. Earleen Newport, DO  [facility]  [facility]   Electronically Signed   By: Corrie Mckusick D.O.   On: 05/22/2015 08:06   Dg Chest Port 1 View  05/21/2015   CLINICAL DATA:  Hypoxia, history of stage IV lung cancer with metastatic disease  EXAM: PORTABLE CHEST - 1 VIEW  COMPARISON:  03/23/2015, 04/07/2015  FINDINGS: Cardiac shadow is stable. Persistent right apical mass lesion is noted consistent with the patient's given clinical history. Patchy infiltrate is noted in the right lung base increased from the prior exam. Additionally diffuse interstitial changes are noted which may represent lymphangitic spread of carcinoma. No sizable effusion is seen. No acute bony abnormality is noted.  IMPRESSION: Stable right apical mass.  Patchy right basilar infiltrate which may represent acute pneumonia.  Diffuse interstitial changes likely related to the underlying neoplasm.   Electronically Signed   By: Inez Catalina M.D.   On: 05/21/2015 08:00   Dg Ang/ext/uni/or Left  05/22/2015   CLINICAL DATA:  64 year old male with a history of bilateral femoral embolectomy with patch angioplasty.  EXAM: LEFT ANG/EXT/UNI/ OR  CONTRAST:  Op note  FLUOROSCOPY TIME:  Op note  COMPARISON:  No comparison available  FINDINGS: Intraoperative spot images during bilateral femoral embolectomy and patch angioplasty.  Left:  Single image during lower extremity angiogram demonstrates partial opacification of the leg vasculature. Surgical clips present along the medial knee.  Right:  Single image during lower extremity angiogram demonstrates partial opacification of the leg vasculature.  IMPRESSION: Intraoperative angiogram of the left and right lower extremity, with incomplete opacification of the leg vasculature bilaterally.  Please refer to the  dictated operative report for full details of intraoperative findings and procedure.  Signed,  Dulcy Fanny. Earleen Newport, DO  [facility]  [facility]   Electronically Signed   By: Corrie Mckusick D.O.   On: 05/22/2015 08:06     CBC  Recent Labs Lab 05/16/15 1139 05/18/15 0240 05/21/15 0754 05/21/15 0759 05/21/15 2200 05/22/15 0615  WBC 8.1 8.6 8.5  --  6.0 6.8  HGB 13.8 12.9* 13.3 14.6 11.3* 11.3*  HCT 41.5 37.4* 39.1 43.0 33.0* 34.0*  PLT 316 312 301  --  226 230  MCV 89.0 86.8 86.9  --  86.2 87.4  MCH 29.7 29.9 29.6  --  29.5 29.0  MCHC 33.4 34.5 34.0  --  34.2 33.2  RDW 13.7 12.8 13.0  --  13.0 13.2  LYMPHSABS 0.6* 0.5* 0.8  --   --   --   MONOABS 0.6 1.0 0.7  --   --   --   EOSABS 0.1 0.1 0.1  --   --   --   BASOSABS 0.1 0.0 0.0  --   --   --     Chemistries   Recent Labs Lab 05/16/15 1140 05/18/15 0240 05/21/15 0754 05/21/15 0759 05/21/15 2200 05/22/15 0615  NA 128* 128* 128* 127*  --  131*  K 5.3* 4.8 4.7 4.7  --  4.6  CL  --  94* 94* 97*  --  99*  CO2 '26 22 24  '$ --   --  20*  GLUCOSE 335* 301* 242* 243*  --  175*  BUN 33.7* 40* 30* 29*  --  20  CREATININE 1.3 1.06 0.79 0.90 0.90 0.93  CALCIUM 11.6* 10.3 9.9  --   --  8.4*  AST '18 24 24  '$ --   --   --  ALT '24 25 22  '$ --   --   --   ALKPHOS 141 110 112  --   --   --   BILITOT 0.29 0.3 0.3  --   --   --    ------------------------------------------------------------------------------------------------------------------ estimated creatinine clearance is 65.7 mL/min (by C-G formula based on Cr of 0.93). ------------------------------------------------------------------------------------------------------------------ No results for input(s): HGBA1C in the last 72 hours. ------------------------------------------------------------------------------------------------------------------ No results for input(s): CHOL, HDL, LDLCALC, TRIG, CHOLHDL, LDLDIRECT in the last 72  hours. ------------------------------------------------------------------------------------------------------------------ No results for input(s): TSH, T4TOTAL, T3FREE, THYROIDAB in the last 72 hours.  Invalid input(s): FREET3 ------------------------------------------------------------------------------------------------------------------ No results for input(s): VITAMINB12, FOLATE, FERRITIN, TIBC, IRON, RETICCTPCT in the last 72 hours.  Coagulation profile No results for input(s): INR, PROTIME in the last 168 hours.  No results for input(s): DDIMER in the last 72 hours.  Cardiac Enzymes No results for input(s): CKMB, TROPONINI, MYOGLOBIN in the last 168 hours.  Invalid input(s): CK ------------------------------------------------------------------------------------------------------------------ Invalid input(s): POCBNP   Time Spent in minutes   35   Kilah Drahos K M.D on 05/22/2015 at 12:00 PM  Between 7am to 7pm - Pager - [phone]  After 7pm go to [email or web address] - password [email or web address]  [facility] -  Office  [phone]

## 2015-05-22 NOTE — Progress Notes (Signed)
Patient complaining of increased anxiety administered Ativan '1mg'$  shortly after patient became confused and highly agitated.  Applied safety mittens to patient administered Metoprolol '5mg'$  for elevated HR.  Decreased stimulus from environment and placed on a bed alarm.  Physician updated on patients reaction to medication.  Will continue to follow up.

## 2015-05-22 NOTE — Progress Notes (Signed)
Patient transferred from PACU on tele via bedby PACU RN, Doren Custard. Left popliteal pulse not dopplerable, Dr. Scot Dock notified, will continue to monitor. Patient oriented to unit and room, instructed on callbell and placed at side. Bed alarm on. No family at bedside.

## 2015-05-22 NOTE — Progress Notes (Addendum)
ANTIBIOTIC CONSULT NOTE - INITIAL  Pharmacy Consult for Vancomycin and Zosyn Indication: pneumonia  Allergies  Allergen Reactions  . Codeine Itching  . Percocet [Oxycodone-Acetaminophen] Other (See Comments)    Sharpe Kidney pains    Patient Measurements: Height: '5\' 7"'$  (170.2 cm) Weight: 127 lb 10.3 oz (57.9 kg) IBW/kg (Calculated) : 66.1  Vital Signs: Temp: 97.4 F (36.3 C) (08/21 0743) Temp Source: Oral (08/21 0743) BP: 135/59 mmHg (08/21 0743) Pulse Rate: 124 (08/21 0743) Intake/Output from previous day: 08/20 0701 - 08/21 0700 In: 3210 [P.O.:360; I.V.:2300; IV Piggyback:550] Out: 1070 [Urine:870; Blood:200] Intake/Output from this shift:    Labs:  Recent Labs  05/21/15 0754 05/21/15 0759 05/21/15 2200 05/22/15 0615  WBC 8.5  --  6.0 6.8  HGB 13.3 14.6 11.3* 11.3*  PLT 301  --  226 230  CREATININE 0.79 0.90 0.90 0.93   Estimated Creatinine Clearance: 65.7 mL/min (by C-G formula based on Cr of 0.93). No results for input(s): VANCOTROUGH, VANCOPEAK, VANCORANDOM, GENTTROUGH, GENTPEAK, GENTRANDOM, TOBRATROUGH, TOBRAPEAK, TOBRARND, AMIKACINPEAK, AMIKACINTROU, AMIKACIN in the last 72 hours.   Microbiology: Recent Results (from the past 720 hour(s))  MRSA PCR Screening     Status: None   Collection Time: 05/21/15 12:38 PM  Result Value Ref Range Status   MRSA by PCR NEGATIVE NEGATIVE Final    Comment:        The GeneXpert MRSA Assay (FDA approved for NASAL specimens only), is one component of a comprehensive MRSA colonization surveillance program. It is not intended to diagnose MRSA infection nor to guide or monitor treatment for MRSA infections.   Culture, sputum-assessment     Status: None   Collection Time: 05/22/15  5:23 AM  Result Value Ref Range Status   Specimen Description SPUTUM  Final   Special Requests NONE  Final   Sputum evaluation   Final    MICROSCOPIC FINDINGS SUGGEST THAT THIS SPECIMEN IS NOT REPRESENTATIVE OF LOWER RESPIRATORY  SECRETIONS. PLEASE RECOLLECT. Gram Stain Report Called to,Read Back By and Verified With: B. GROGAN,RN AT [id number] ON [id number] BY Rhea Bleacher    Report Status 05/22/2015 FINAL  Final    Medical History: Past Medical History  Diagnosis Date  . WPW (Wolff-Parkinson-White syndrome)     ablated  . DM (dermatomyositis)     x 10 years  . Hyperlipidemia     x 13 years  . Tobacco abuse   . CAD (coronary artery disease)     1997 LAD 95% stenosis. He had Rotablator of small  couple lesions in this astery. His last stress perfusion study was in 2001 with no evidence of ischemia.  . Iliac artery occlusion, right   . Hypertension     x 13 years  . Anginal pain   . Type II diabetes mellitus     Type 2  . Lung cancer     stage 4 - with mets  . Hepatitis A 1972    "in [employer]"  . Shortness of breath dyspnea     Medications:  Scheduled:  . aspirin  81 mg Oral Daily  . atorvastatin  40 mg Oral q1800  . azithromycin  500 mg Intravenous Q24H  . bisacodyl  10 mg Oral Daily  . cefTRIAXone (ROCEPHIN)  IV  1 g Intravenous Q24H  . docusate sodium  200 mg Oral BID  . enoxaparin (LOVENOX) injection  40 mg Subcutaneous Q24H  . feeding supplement (ENSURE ENLIVE)  237 mL Oral BID BM  . folic acid  1 mg Oral Daily  . insulin aspart  0-5 Units Subcutaneous QHS  . insulin aspart  0-9 Units Subcutaneous TID WC  . levalbuterol  1.25 mg Nebulization 4 times per day  . lisinopril  10 mg Oral Daily  . metoprolol  25 mg Oral BID  . pantoprazole  40 mg Oral QHS  . polyethylene glycol  17 g Oral BID  . tamsulosin  0.4 mg Oral Daily   Assessment: 63 yoF with acute hypoxic respiratory failure in pt with known COPD and recently diagnosed NSCLC. Pt septic secondary to lobar PNA. Pharmacy consulted to dose Vancomycin and Zosyn for PNA.   Goal of Therapy:  Vancomycin trough level 15-20 mcg/ml  Plan:  Vancomycin '1000mg'$  x1, followed by '750mg'$  Q12hr Zosyn 3.375 Q8hr Will continue to follow renal function, culture  results, LOT, and antibiotic de-escalation plans   Darl Pikes, PharmD Clinical Pharmacist- Resident Pager: [phone]  Darl Pikes 05/22/2015,9:17 AM

## 2015-05-22 NOTE — H&P (Signed)
ANTICOAGULATION CONSULT NOTE - Initial Consult  Pharmacy Consult for Eliquis Indication: atrial fibrillation  Allergies  Allergen Reactions  . Codeine Itching  . Percocet [Oxycodone-Acetaminophen] Other (See Comments)    Sharpe Kidney pains    Patient Measurements: Height: '5\' 7"'$  (170.2 cm) Weight: 127 lb 10.3 oz (57.9 kg) IBW/kg (Calculated) : 66.1  Vital Signs: Temp: 97.6 F (36.4 C) (08/21 1150) Temp Source: Oral (08/21 1150) BP: 108/59 mmHg (08/21 1109) Pulse Rate: 113 (08/21 1109)  Labs:  Recent Labs  05/21/15 0754 05/21/15 0759 05/21/15 2200 05/22/15 0615 05/22/15 1110  HGB 13.3 14.6 11.3* 11.3*  --   HCT 39.1 43.0 33.0* 34.0*  --   PLT 301  --  226 230  --   CREATININE 0.79 0.90 0.90 0.93  --   TROPONINI  --   --   --   --  0.49*    Estimated Creatinine Clearance: 65.7 mL/min (by C-G formula based on Cr of 0.93).   Medical History: Past Medical History  Diagnosis Date  . WPW (Wolff-Parkinson-White syndrome)     ablated  . DM (dermatomyositis)     x 10 years  . Hyperlipidemia     x 13 years  . Tobacco abuse   . CAD (coronary artery disease)     1997 LAD 95% stenosis. He had Rotablator of small  couple lesions in this astery. His last stress perfusion study was in 2001 with no evidence of ischemia.  . Iliac artery occlusion, right   . Hypertension     x 13 years  . Anginal pain   . Type II diabetes mellitus     Type 2  . Lung cancer     stage 4 - with mets  . Hepatitis A 1972    "in [employer]"  . Shortness of breath dyspnea     Medications:  Scheduled:  . apixaban  5 mg Oral BID  . aspirin  81 mg Oral Daily  . atorvastatin  40 mg Oral q1800  . bisacodyl  10 mg Oral Daily  . docusate sodium  200 mg Oral BID  . feeding supplement (ENSURE ENLIVE)  237 mL Oral BID BM  . folic acid  1 mg Oral Daily  . insulin aspart  0-5 Units Subcutaneous QHS  . insulin aspart  0-9 Units Subcutaneous TID WC  . levalbuterol  1.25 mg Nebulization 4 times per day   . lisinopril  10 mg Oral Daily  . metoprolol  25 mg Oral BID  . pantoprazole  40 mg Oral QHS  . piperacillin-tazobactam (ZOSYN)  IV  3.375 g Intravenous 3 times per day  . [START ON 05/23/2015] pneumococcal 23 valent vaccine  0.5 mL Intramuscular Tomorrow-1000  . polyethylene glycol  17 g Oral BID  . tamsulosin  0.4 mg Oral Daily  . vancomycin  750 mg Intravenous Q12H    Assessment: 79 yoM on eliquis PTA for Afib. Medication was on hold prior to admission according to patient for dental procedure. Pharmacy consulted to resume Eliquis.SCr 0.93, wt 57.9, Hgb 11.3, plt wnl, no signs of bleeding  Goal of Therapy:  Monitor platelets by anticoagulation protocol: Yes   Plan:  Start Eliquis '5mg'$  bid Monitor CBC, SCr, and s/sx of bleeding  Darl Pikes, PharmD Clinical Pharmacist- Resident Pager: [phone]  Darl Pikes 05/22/2015,1:27 PM

## 2015-05-23 ENCOUNTER — Inpatient Hospital Stay (HOSPITAL_COMMUNITY): Payer: Medicare PPO

## 2015-05-23 ENCOUNTER — Encounter (HOSPITAL_COMMUNITY): Payer: Self-pay | Admitting: Student

## 2015-05-23 ENCOUNTER — Ambulatory Visit: Payer: Self-pay

## 2015-05-23 ENCOUNTER — Other Ambulatory Visit: Payer: Self-pay

## 2015-05-23 ENCOUNTER — Other Ambulatory Visit (HOSPITAL_COMMUNITY): Payer: Self-pay | Admitting: Dentistry

## 2015-05-23 DIAGNOSIS — G893 Neoplasm related pain (acute) (chronic): Secondary | ICD-10-CM | POA: Insufficient documentation

## 2015-05-23 DIAGNOSIS — Z9889 Other specified postprocedural states: Secondary | ICD-10-CM

## 2015-05-23 DIAGNOSIS — E860 Dehydration: Secondary | ICD-10-CM | POA: Insufficient documentation

## 2015-05-23 DIAGNOSIS — K5900 Constipation, unspecified: Secondary | ICD-10-CM | POA: Insufficient documentation

## 2015-05-23 LAB — TYPE AND SCREEN
ABO/RH(D): O POS
ANTIBODY SCREEN: NEGATIVE
UNIT DIVISION: 0
Unit division: 0

## 2015-05-23 LAB — GLUCOSE, CAPILLARY
GLUCOSE-CAPILLARY: 194 mg/dL — AB (ref 65–99)
GLUCOSE-CAPILLARY: 136 mg/dL — AB (ref 65–99)
Glucose-Capillary: 150 mg/dL — ABNORMAL HIGH (ref 65–99)
Glucose-Capillary: 157 mg/dL — ABNORMAL HIGH (ref 65–99)

## 2015-05-23 LAB — HEMOGLOBIN A1C
HEMOGLOBIN A1C: 8.1 % — AB (ref 4.8–5.6)
Mean Plasma Glucose: 186 mg/dL

## 2015-05-23 LAB — COMPREHENSIVE METABOLIC PANEL
ALBUMIN: 2.2 g/dL — AB (ref 3.5–5.0)
ALK PHOS: 112 U/L (ref 38–126)
ALT: 32 U/L (ref 17–63)
ANION GAP: 11 (ref 5–15)
AST: 77 U/L — ABNORMAL HIGH (ref 15–41)
BUN: 21 mg/dL — AB (ref 6–20)
CALCIUM: 8.8 mg/dL — AB (ref 8.9–10.3)
CHLORIDE: 99 mmol/L — AB (ref 101–111)
CO2: 22 mmol/L (ref 22–32)
CREATININE: 0.93 mg/dL (ref 0.61–1.24)
GFR calc non Af Amer: >60 mL/min (ref >60)
GLUCOSE: 181 mg/dL — AB (ref 65–99)
Potassium: 4.3 mmol/L (ref 3.5–5.1)
SODIUM: 132 mmol/L — AB (ref 135–145)
TOTAL PROTEIN: 5.6 g/dL — AB (ref 6.5–8.1)
Total Bilirubin: 0.7 mg/dL (ref 0.3–1.2)

## 2015-05-23 LAB — LEGIONELLA ANTIGEN, URINE

## 2015-05-23 LAB — CBC
HCT: 33.4 % — ABNORMAL LOW (ref 39.0–52.0)
HEMOGLOBIN: 11.2 g/dL — AB (ref 13.0–17.0)
MCH: 29.6 pg (ref 26.0–34.0)
MCHC: 33.5 g/dL (ref 30.0–36.0)
MCV: 88.1 fL (ref 78.0–100.0)
PLATELETS: 219 10*3/uL (ref 150–400)
RBC: 3.79 MIL/uL — AB (ref 4.22–5.81)
RDW: 13.3 % (ref 11.5–15.5)
WBC: 7.4 10*3/uL (ref 4.0–10.5)

## 2015-05-23 LAB — MAGNESIUM: MAGNESIUM: 1.7 mg/dL (ref 1.7–2.4)

## 2015-05-23 LAB — TROPONIN I: Troponin I: 0.27 ng/mL — ABNORMAL HIGH (ref <0.031)

## 2015-05-23 MED ORDER — HYDROCORTISONE 1 % EX CREA
TOPICAL_CREAM | Freq: Three times a day (TID) | CUTANEOUS | Status: DC
  Administered 2015-05-23 – 2015-05-25 (×7): via TOPICAL
  Filled 2015-05-23: qty 28

## 2015-05-23 MED ORDER — NICOTINE 21 MG/24HR TD PT24
21.0000 mg | MEDICATED_PATCH | Freq: Every day | TRANSDERMAL | Status: DC
  Administered 2015-05-23 – 2015-05-26 (×4): 21 mg via TRANSDERMAL
  Filled 2015-05-23 (×4): qty 1

## 2015-05-23 MED ORDER — DIGOXIN 250 MCG PO TABS
0.2500 mg | ORAL_TABLET | Freq: Every day | ORAL | Status: DC
  Filled 2015-05-23: qty 1

## 2015-05-23 MED ORDER — DIGOXIN 0.25 MG/ML IJ SOLN
0.2500 mg | Freq: Four times a day (QID) | INTRAMUSCULAR | Status: AC
  Administered 2015-05-23 (×2): 0.25 mg via INTRAVENOUS
  Filled 2015-05-23 (×2): qty 1

## 2015-05-23 MED ORDER — METOPROLOL TARTRATE 50 MG PO TABS
50.0000 mg | ORAL_TABLET | Freq: Two times a day (BID) | ORAL | Status: DC
  Administered 2015-05-23 – 2015-05-26 (×5): 50 mg via ORAL
  Filled 2015-05-23 (×7): qty 1

## 2015-05-23 MED ORDER — METHYLNALTREXONE BROMIDE 12 MG/0.6ML ~~LOC~~ SOLN
8.0000 mg | SUBCUTANEOUS | Status: DC
  Filled 2015-05-23 (×2): qty 0.6

## 2015-05-23 MED ORDER — PNEUMOCOCCAL VAC POLYVALENT 25 MCG/0.5ML IJ INJ
0.5000 mL | INJECTION | Freq: Once | INTRAMUSCULAR | Status: DC
  Filled 2015-05-23: qty 0.5

## 2015-05-23 MED ORDER — IOHEXOL 350 MG/ML SOLN
80.0000 mL | Freq: Once | INTRAVENOUS | Status: AC | PRN
  Administered 2015-05-23: 80 mL via INTRAVENOUS

## 2015-05-23 MED ORDER — METHYLNALTREXONE BROMIDE 12 MG/0.6ML ~~LOC~~ SOLN: 12.0000 mg | SUBCUTANEOUS | Status: DC

## 2015-05-23 MED ORDER — POTASSIUM CHLORIDE 20 MEQ/15ML (10%) PO SOLN
40.0000 meq | Freq: Once | ORAL | Status: AC
  Administered 2015-05-23: 40 meq via ORAL
  Filled 2015-05-23 (×2): qty 30

## 2015-05-23 MED ORDER — FUROSEMIDE 10 MG/ML IJ SOLN
20.0000 mg | Freq: Once | INTRAMUSCULAR | Status: AC
  Administered 2015-05-23: 20 mg via INTRAVENOUS
  Filled 2015-05-23: qty 2

## 2015-05-23 MED ORDER — NITROGLYCERIN 0.4 MG SL SUBL
0.4000 mg | SUBLINGUAL_TABLET | SUBLINGUAL | Status: DC | PRN
  Administered 2015-05-25: 0.4 mg via SUBLINGUAL
  Filled 2015-05-23 (×2): qty 1

## 2015-05-23 NOTE — Progress Notes (Signed)
Initial Nutrition Assessment  DOCUMENTATION CODES:   Non-severe (moderate) malnutrition in context of chronic illness  INTERVENTION:   Continue Ensure Enlive po BID, each supplement provides 350 kcal and 20 grams of protein.  Encourage adequate PO intake.  NUTRITION DIAGNOSIS:   Malnutrition related to chronic illness as evidenced by percent weight loss, moderate depletions of muscle mass, moderate depletion of body fat.  GOAL:   Patient will meet greater than or equal to 90% of their needs  MONITOR:   PO intake, Supplement acceptance, Weight trends, Labs, I & O's  REASON FOR ASSESSMENT:   Malnutrition Screening Tool    ASSESSMENT:   64 y.o. male with a history of metastatic adenocarcinoma of the lung. He has metastatic disease to his spine and hips. He presented to the emergency department with severe bilateral lower extremity pain and was found to have ischemic lower extremities  PROCEDURE (8/20):  1. Bilateral common femoral artery endarterectomies with bovine pericardial patch angioplasties 2. Bilateral femoral embolectomies 3. Left to right femorofemoral bypass graft (8 mm Dacron graft) 4. Intraoperative arteriogram   Pt reports appetite has been improving. Current meal completion is 50%. PTA pt reports having a decreased appetite since June and consuming 2 meals a day with an occasional Ensure. Usual body weight reported to be ~136 lbs. Per Epic weight records, pt with a 9.5% weight loss in 1 month. Pt currently has Ensure ordered and has been consuming them. RD to continue with current orders.   Nutrition-Focused physical exam completed. Findings are moderate fat depletion, moderate muscle depletion, and no edema.   Labs and medications reviewed.  Diet Order:  Diet heart healthy/carb modified Room service appropriate?: Yes; Fluid consistency:: Thin  Skin:   (Incision on groin)  Last BM:  8/22  Height:   Ht Readings from Last 1 Encounters:  05/21/15 '5\' 7"'$   (1.702 m)    Weight:   Wt Readings from Last 1 Encounters:  05/23/15 123 lb 7.3 oz (56 kg)    Ideal Body Weight:  67.27 kg  BMI:  Body mass index is 19.33 kg/(m^2).  Estimated Nutritional Needs:   Kcal:  1900-2100  Protein:  80-100 grams  Fluid:  1.9 - 2.1 L/day  EDUCATION NEEDS:   No education needs identified at this time  Corrin Parker, MS, RD, LDN Pager # [phone] After hours/ weekend pager # [phone]

## 2015-05-23 NOTE — Progress Notes (Signed)
Patient Demographics:    Mark Hurst, is a 64 y.o. male, DOB - April 25, 1951, GUY:[id number]  Admit date - 05/21/2015   Admitting Physician Theodis Blaze, MD  Outpatient Primary MD for the patient is [name], MD  LOS - 2   Chief Complaint  Patient presents with  . Leg Pain  . Constipation        Subjective:    [name] today has, No headache, No chest pain, No abdominal pain - No Nausea, No new weakness tingling or numbness, No Cough - does have some shortness of breath and plains of severe constipation. Chronic left leg weakness.   Assessment  & Plan :     1. Bilateral femoral artery embolization with ischemia - he is post by lateral femoral embolectomy by Dr. Doren Custard on 05/22/2015, has chronic left leg weakness for which PT and OT will be consulted. Continue supportive care. Likely embolization due to underlying history of paroxysmal atrial fibrillation along with lung cancer with hypercoagulable state. Placed on Eliquis, continue aspirin for now will stop if okay by vascular surgery.   2. Paroxysmal atrial fibrillation with RVR. [place] Vasc 2 score now least greater than 4. Embolization to bilateral femoral artery, was on Eliquis what was held for the last 2-3 weeks for a dental procedure which has been long done, has been placed on Lopressor, blood pressure soft so I have added digoxin, recent echocardiogram noted with preserved EF of 60%. Eliquis has been resumed pharmacy monitoring. Cardiology requested to evaluate for RVR.   3. 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 Follows with Dr. Julien Nordmann. He is to undergo palliative chemotherapy in the future. Dr. Julien Nordmann added to the treatment team.   4. Acute hypoxic  respiratory failure due to Sepsis due Aspiration HCAP. Placed on appropriate anti-biotics. Monitor cultures, supportive care with oxygen and nebulizer treatments as needed. Clinical possible mild fluid overload will give a trial of IV Lasix as well.   5. Ongoing smoking. Counseled to quit.   6. Essential hypertension. On ACE inhibitor, added low-dose Lopressor for better control.   7. Dyslipidemia. Continue home dose statin.    8. Constipation. Placed on bowel regimen. Give soapsuds enema and a dose of Relistor.   9. Mild elevation in troponin. Troponin rise in none serious pattern, no chest pain, likely mild demand ischemia from RVR, on Eliquis, on aspirin which will be stopped if okay by vascular surgeon. On beta blocker, requested to stop smoking. Cardiology requested to evaluate. In my opinion he is a candidate for medical management only due to his underlying with ileus above listed comorbidities.   10.  DM type II. Currently on sliding scale continue and monitor Y Stimac control.  CBG (last 3)   Recent Labs  05/22/15 1504 05/22/15 2128 05/23/15 0849  GLUCAP 181* 154* 150*      Code Status : DO NOT RESUSCITATE. Prognosis is poor has been discussed with patient and friend bedside in detail.  Family Communication  : Friend bedside  Disposition Plan  : Likely home in the next 3-4 days  Consults  :  Vascular surgeon Dr. Doren Custard  Procedures  :   By vascular surgeon Dr. Doren Custard  on 05/21/2015  1. Bilateral common femoral artery endarterectomies with bovine pericardial patch angioplasties 2. Bilateral femoral embolectomies 3. Left to right femorofemoral bypass graft (8 mm Dacron graft) 4. Intraoperative arteriogram 2   TTE 03-04-15  - Left ventricle: The cavity size was normal. Wall thickness wasincreased increased in a pattern of mild to moderate LVH. The estimated ejection fraction was 60%. Wall motion was normal;there were no regional wall motion  abnormalities. - Aortic valve: Not sure if two or three cusps. The more anteriorcusp may dome. There is very slight gradient across the valve. Trivial AI. - Right ventricle: The cavity size was normal. Systolic functionwas normal.   DVT Prophylaxis  :  Lovenox    Lab Results  Component Value Date   PLT 219 05/23/2015    Inpatient Medications  Scheduled Meds: . apixaban  5 mg Oral BID  . aspirin  81 mg Oral Daily  . atorvastatin  40 mg Oral q1800  . bisacodyl  10 mg Oral Daily  . digoxin  0.25 mg Intravenous Q6H  . [START ON 05/24/2015] digoxin  0.25 mg Oral Daily  . docusate sodium  200 mg Oral BID  . feeding supplement (ENSURE ENLIVE)  237 mL Oral BID BM  . folic acid  1 mg Oral Daily  . furosemide  20 mg Intravenous Once  . hydrocortisone cream   Topical TID  . insulin aspart  0-5 Units Subcutaneous QHS  . insulin aspart  0-9 Units Subcutaneous TID WC  . levalbuterol  1.25 mg Nebulization TID  . lisinopril  10 mg Oral Daily  . [START ON 05/25/2015] methylnaltrexone  8 mg Subcutaneous QODAY  . metoprolol  25 mg Oral BID  . nicotine  21 mg Transdermal Daily  . pantoprazole  40 mg Oral QHS  . piperacillin-tazobactam (ZOSYN)  IV  3.375 g Intravenous 3 times per day  . pneumococcal 23 valent vaccine  0.5 mL Intramuscular Tomorrow-1000  . polyethylene glycol  17 g Oral BID  . tamsulosin  0.4 mg Oral Daily  . vancomycin  750 mg Intravenous Q12H   Continuous Infusions: . sodium chloride 50 mL/hr at 05/22/15 1900   PRN Meds:.alum & mag hydroxide-simeth, guaiFENesin-dextromethorphan, haloperidol lactate, hydrALAZINE, HYDROmorphone (DILAUDID) injection, HYDROmorphone (DILAUDID) injection, ipratropium-albuterol, magnesium sulfate 1 - 4 g bolus IVPB, metoprolol, ondansetron **OR** ondansetron (ZOFRAN) IV, phenol, potassium chloride, sodium chloride  Antibiotics  :    Anti-infectives    Start     Dose/Rate Route Frequency Ordered Stop   05/22/15 2200  vancomycin (VANCOCIN)  IVPB 750 mg/150 ml premix     750 mg 150 mL/hr over 60 Minutes Intravenous Every 12 hours 05/22/15 0945     05/22/15 1400  piperacillin-tazobactam (ZOSYN) IVPB 3.375 g     3.375 g 12.5 mL/hr over 240 Minutes Intravenous 3 times per day 05/22/15 0945     05/22/15 1030  vancomycin (VANCOCIN) IVPB 1000 mg/200 mL premix     1,000 mg 200 mL/hr over 60 Minutes Intravenous  Once 05/22/15 0945 05/22/15 1212   05/21/15 2200  azithromycin (ZITHROMAX) 500 mg in dextrose 5 % 250 mL IVPB  Status:  Discontinued     500 mg 250 mL/hr over 60 Minutes Intravenous Every 24 hours 05/21/15 1354 05/22/15 0945   05/21/15 2100  cefTRIAXone (ROCEPHIN) 1 g in dextrose 5 % 50 mL IVPB  Status:  Discontinued     1 g 100 mL/hr over 30 Minutes Intravenous Every 24 hours 05/21/15 1354 05/22/15 0945   05/21/15  1330  ceFAZolin (ANCEF) IVPB 2 g/50 mL premix    Comments:  Send with pt to OR   2 g 100 mL/hr over 30 Minutes Intravenous To [facility] 05/21/15 1315 05/21/15 1501   05/21/15 0830  piperacillin-tazobactam (ZOSYN) IVPB 3.375 g     3.375 g 100 mL/hr over 30 Minutes Intravenous  Once 05/21/15 0823 05/21/15 0854   05/21/15 0815  piperacillin-tazobactam (ZOSYN) IVPB 3.375 g  Status:  Discontinued     3.375 g 12.5 mL/hr over 240 Minutes Intravenous  Once 05/21/15 0808 05/21/15 0823        Objective:   Filed Vitals:   05/23/15 0632 05/23/15 0740 05/23/15 0741 05/23/15 0917  BP:  91/59 105/57   Pulse:  128 128 143  Temp:  98.3 F (36.8 C)    TempSrc:  Oral    Resp:  '26 25 28  '$ Height:      Weight: 56 kg (123 lb 7.3 oz)     SpO2:  100% 99% 99%    Wt Readings from Last 3 Encounters:  05/23/15 56 kg (123 lb 7.3 oz)  05/11/15 61.689 kg (136 lb)  05/02/15 61.236 kg (135 lb)     Intake/Output Summary (Last 24 hours) at 05/23/15 0948 Last data filed at 05/23/15 0800  Gross per 24 hour  Intake   1030 ml  Output      0 ml  Net   1030 ml     Physical Exam  Awake Alert, Oriented X 3, No new  F.N deficits, Chr L leg weakness, Normal affect [place].AT,PERRAL Supple Neck,No JVD, No cervical lymphadenopathy appriciated.  Symmetrical Chest wall movement, Good air movement bilaterally, CTAB iRRR,No Gallops,Rubs or new Murmurs, No Parasternal Heave +ve B.Sounds, Abd Soft, No tenderness, No organomegaly appriciated, No rebound - guarding or rigidity. No Cyanosis, Clubbing or edema, No new Rash or bruise      Data Review:   Micro Results Recent Results (from the past 240 hour(s))  MRSA PCR Screening     Status: None   Collection Time: 05/21/15 12:38 PM  Result Value Ref Range Status   MRSA by PCR NEGATIVE NEGATIVE Final    Comment:        The GeneXpert MRSA Assay (FDA approved for NASAL specimens only), is one component of a comprehensive MRSA colonization surveillance program. It is not intended to diagnose MRSA infection nor to guide or monitor treatment for MRSA infections.   Urine culture     Status: None   Collection Time: 05/21/15  1:35 PM  Result Value Ref Range Status   Specimen Description URINE, RANDOM  Final   Special Requests NONE  Final   Culture NO GROWTH 1 DAY  Final   Report Status 05/22/2015 FINAL  Final  Culture, sputum-assessment     Status: None   Collection Time: 05/22/15  5:23 AM  Result Value Ref Range Status   Specimen Description SPUTUM  Final   Special Requests NONE  Final   Sputum evaluation   Final    MICROSCOPIC FINDINGS SUGGEST THAT THIS SPECIMEN IS NOT REPRESENTATIVE OF LOWER RESPIRATORY SECRETIONS. PLEASE RECOLLECT. Gram Stain Report Called to,Read Back By and Verified With: B. GROGAN,RN AT [id number] ON [id number] BY Rhea Bleacher    Report Status 05/22/2015 FINAL  Final    Radiology Reports Ct Head Wo Contrast  05/22/2015   CLINICAL DATA:  Confusion.  EXAM: CT HEAD WITHOUT CONTRAST  TECHNIQUE: Contiguous axial images were obtained from the base of the skull  through the vertex without intravenous contrast.  COMPARISON:  MRI brain 05/05/2015   FINDINGS: Mild diffuse cerebral atrophy. Patchy low-attenuation changes in the deep white matter. This is likely due to small vessel ischemia. No mass effect or midline shift. No abnormal extra-axial fluid collections. Gray-white matter junctions are distinct. Basal cisterns are not effaced. No evidence of acute intracranial hemorrhage. No depressed skull fractures. Visualized paranasal sinuses and mastoid air cells are not opacified. Vascular calcifications.  IMPRESSION: No acute intracranial abnormalities. Mild chronic atrophy and small vessel ischemic changes.   Electronically Signed   By: Lucienne Capers M.D.   On: 05/22/2015 18:27   Ct Lumbar Spine Wo Contrast  05/18/2015   CLINICAL DATA:  Low back pain.  Constipation.  EXAM: CT LUMBAR SPINE WITHOUT CONTRAST  TECHNIQUE: Multidetector CT imaging of the lumbar spine was performed without intravenous contrast administration. Multiplanar CT image reconstructions were also generated.  COMPARISON:  PET-CT 04/07/2015  FINDINGS: On scout imaging there is constipation with stool distending the entire colon.  Lumbar spine:  No acute fracture, endplate erosion, or traumatic malalignment. Bone metastases are not appreciated, including a large deposit in the L2 body seen on previous PET-CT.  Right L5 pars defect without slip.  Mild disc bulging at L4-5 and L5-S1 without suspected impingement. Mild moderate left facet arthropathy at L4-5 and L5-S1. No gross canal hematoma.  Small right pleural effusion. Right adrenal thickening, likely metastatic deposit based on previous PET-CT. Abdominal aortic aneurysm with a 34 mm maximal diameter. Attenuated appearance of the right common and external iliac arteries consistent with chronic occlusion.  Pelvis: Status post open reduction internal fixation of the proximal right femur. There is no evidence of periprosthetic fracture or hardware loosening. No acute fracture or diastasis. Metastases within the right pelvis on prior PET-CT  are not visible by CT.  Mild osteoarthritic spurring about the sacroiliac joints. No notable hip degenerative change.  Sigmoid diverticulosis.  IMPRESSION: 1. No acute finding in the lumbar spine or pelvis. 2. Osseous metastases seen on PET-CT 04/07/2015 are not visible by CT. 3. Large stool volume. 4. Right pleural effusion is small but increased from July 2016.   Electronically Signed   By: Monte Fantasia M.D.   On: 05/18/2015 02:52   Ct Pelvis Wo Contrast  05/18/2015   CLINICAL DATA:  Low back pain.  Constipation.  EXAM: CT LUMBAR SPINE WITHOUT CONTRAST  TECHNIQUE: Multidetector CT imaging of the lumbar spine was performed without intravenous contrast administration. Multiplanar CT image reconstructions were also generated.  COMPARISON:  PET-CT 04/07/2015  FINDINGS: On scout imaging there is constipation with stool distending the entire colon.  Lumbar spine:  No acute fracture, endplate erosion, or traumatic malalignment. Bone metastases are not appreciated, including a large deposit in the L2 body seen on previous PET-CT.  Right L5 pars defect without slip.  Mild disc bulging at L4-5 and L5-S1 without suspected impingement. Mild moderate left facet arthropathy at L4-5 and L5-S1. No gross canal hematoma.  Small right pleural effusion. Right adrenal thickening, likely metastatic deposit based on previous PET-CT. Abdominal aortic aneurysm with a 34 mm maximal diameter. Attenuated appearance of the right common and external iliac arteries consistent with chronic occlusion.  Pelvis: Status post open reduction internal fixation of the proximal right femur. There is no evidence of periprosthetic fracture or hardware loosening. No acute fracture or diastasis. Metastases within the right pelvis on prior PET-CT are not visible by CT.  Mild osteoarthritic spurring about the sacroiliac joints. No  notable hip degenerative change.  Sigmoid diverticulosis.  IMPRESSION: 1. No acute finding in the lumbar spine or pelvis. 2.  Osseous metastases seen on PET-CT 04/07/2015 are not visible by CT. 3. Large stool volume. 4. Right pleural effusion is small but increased from July 2016.   Electronically Signed   By: Monte Fantasia M.D.   On: 05/18/2015 02:52   Mr Jeri Cos IE Contrast  05/05/2015   CLINICAL DATA:  64 year old male with stage IV non-small cell lung cancer diagnosed in June. Staging. Subsequent encounter.  EXAM: MRI HEAD WITHOUT AND WITH CONTRAST  TECHNIQUE: Multiplanar, multiecho pulse sequences of the brain and surrounding structures were obtained without and with intravenous contrast.  CONTRAST:  37m MULTIHANCE GADOBENATE DIMEGLUMINE 529 MG/ML IV SOLN  COMPARISON:  PET-CT 04/07/2015.  FINDINGS: There is a small triangular focus of T2 and FLAIR hyperintensity in the right cerebellum (series 6, image 7) associated with curvilinear postcontrast enhancement (series 11, image 12). However, this more resembles a small area of encephalomalacia than a mass. No associated hemosiderin. Diffusion here is facilitated.  No other abnormal enhancement identified.  No dural thickening.  Patchy T2 hyperintensity in the pons. No supratentorial encephalomalacia, with normal for age hemispheric gray and white matter signal.  No restricted diffusion to suggest acute infarction. No intracranial mass effect, ventriculomegaly, extra-axial collection or acute intracranial hemorrhage. Cervicomedullary junction and pituitary are within normal limits. Grossly negative visualized cervical spine.  Visualized bone marrow signal is within normal limits. Negative scalp soft tissues. Orbits soft tissues are normal aside from postoperative changes to the globes. Visible internal auditory structures appear normal. Visualized paranasal sinuses and mastoids are clear.  IMPRESSION: 1. A small 7 mm focus of signal abnormality and enhancement in the right cerebellar hemisphere more resembles sequelae of small vessel ischemia or a small benign vascular malformation  than a small metastasis. Recommend repeat study to document stability in 3 months, unless clinically indicated sooner. 2. Otherwise no acute or metastatic intracranial process.   Electronically Signed   By: HGenevie AnnM.D.   On: 05/05/2015 14:06   Dg Ang/ext/uni/or Right  05/22/2015   CLINICAL DATA:  64year old male with a history of bilateral femoral embolectomy with patch angioplasty.  EXAM: LEFT ANG/EXT/UNI/ OR  CONTRAST:  Op note  FLUOROSCOPY TIME:  Op note  COMPARISON:  No comparison available  FINDINGS: Intraoperative spot images during bilateral femoral embolectomy and patch angioplasty.  Left:  Single image during lower extremity angiogram demonstrates partial opacification of the leg vasculature. Surgical clips present along the medial knee.  Right:  Single image during lower extremity angiogram demonstrates partial opacification of the leg vasculature.  IMPRESSION: Intraoperative angiogram of the left and right lower extremity, with incomplete opacification of the leg vasculature bilaterally.  Please refer to the dictated operative report for full details of intraoperative findings and procedure.  Signed,  JDulcy Fanny WEarleen Newport DO  [facility]  [facility]   Electronically Signed   By: JCorrie MckusickD.O.   On: 05/22/2015 08:06   Dg Chest Port 1 View  05/21/2015   CLINICAL DATA:  Hypoxia, history of stage IV lung cancer with metastatic disease  EXAM: PORTABLE CHEST - 1 VIEW  COMPARISON:  03/23/2015, 04/07/2015  FINDINGS: Cardiac shadow is stable. Persistent right apical mass lesion is noted consistent with the patient's given clinical history. Patchy infiltrate is noted in the right lung base increased from the prior exam. Additionally diffuse interstitial changes are noted which may represent  lymphangitic spread of carcinoma. No sizable effusion is seen. No acute bony abnormality is noted.  IMPRESSION: Stable right apical mass.  Patchy right basilar infiltrate  which may represent acute pneumonia.  Diffuse interstitial changes likely related to the underlying neoplasm.   Electronically Signed   By: Inez Catalina M.D.   On: 05/21/2015 08:00   Dg Abd Acute W/chest  05/22/2015   CLINICAL DATA:  Constipation.  Shortness of breath.  Abdominal pain.  EXAM: DG ABDOMEN ACUTE W/ 1V CHEST  COMPARISON:  None.  FINDINGS: There is a moderate to large amount of fecal matter throughout the colon. Small bowel gas pattern is normal. No visible free air. No worrisome calcifications or acute bony findings. Calcification of the aorta is noted. Previous ORIF of right hip fracture.  One-view chest again shows widespread patchy density and a right apical mass. Patchy pulmonary infiltrate is worsened in general, particularly at the right lung base.  IMPRESSION: Large amount of fecal matter in the colon consistent with the clinical diagnosis of constipation. No evidence of small bowel obstruction or perforation.  Widespread patchy pulmonary densities consistent with pneumonia, somewhat progressive in the right lower lobe.   Electronically Signed   By: Nelson Chimes M.D.   On: 05/22/2015 13:46   Dg Ang/ext/uni/or Left  05/22/2015   CLINICAL DATA:  64 year old male with a history of bilateral femoral embolectomy with patch angioplasty.  EXAM: LEFT ANG/EXT/UNI/ OR  CONTRAST:  Op note  FLUOROSCOPY TIME:  Op note  COMPARISON:  No comparison available  FINDINGS: Intraoperative spot images during bilateral femoral embolectomy and patch angioplasty.  Left:  Single image during lower extremity angiogram demonstrates partial opacification of the leg vasculature. Surgical clips present along the medial knee.  Right:  Single image during lower extremity angiogram demonstrates partial opacification of the leg vasculature.  IMPRESSION: Intraoperative angiogram of the left and right lower extremity, with incomplete opacification of the leg vasculature bilaterally.  Please refer to the dictated operative  report for full details of intraoperative findings and procedure.  Signed,  Dulcy Fanny. Earleen Newport, DO  [facility]  [facility]   Electronically Signed   By: Corrie Mckusick D.O.   On: 05/22/2015 08:06     CBC  Recent Labs Lab 05/16/15 1139  05/18/15 0240 05/21/15 0754 05/21/15 0759 05/21/15 2200 05/22/15 0615 05/23/15 0010  WBC 8.1  --  8.6 8.5  --  6.0 6.8 7.4  HGB 13.8  < > 12.9* 13.3 14.6 11.3* 11.3* 11.2*  HCT 41.5  < > 37.4* 39.1 43.0 33.0* 34.0* 33.4*  PLT 316  --  312 301  --  226 230 219  MCV 89.0  --  86.8 86.9  --  86.2 87.4 88.1  MCH 29.7  --  29.9 29.6  --  29.5 29.0 29.6  MCHC 33.4  --  34.5 34.0  --  34.2 33.2 33.5  RDW 13.7  --  12.8 13.0  --  13.0 13.2 13.3  LYMPHSABS 0.6*  --  0.5* 0.8  --   --   --   --   MONOABS 0.6  --  1.0 0.7  --   --   --   --   EOSABS 0.1  --  0.1 0.1  --   --   --   --   BASOSABS 0.1  --  0.0 0.0  --   --   --   --   < > = values in  this interval not displayed.  Chemistries   Recent Labs Lab 05/16/15 1140  05/18/15 0240 05/21/15 0754 05/21/15 0759 05/21/15 2200 05/22/15 0615 05/23/15 0010  NA 128*  --  128* 128* 127*  --  131* 132*  K 5.3*  --  4.8 4.7 4.7  --  4.6 4.3  CL  --   --  94* 94* 97*  --  99* 99*  CO2 26  --  22 24  --   --  20* 22  GLUCOSE 335*  --  301* 242* 243*  --  175* 181*  BUN 33.7*  --  40* 30* 29*  --  20 21*  CREATININE 1.3  < > 1.06 0.79 0.90 0.90 0.93 0.93  CALCIUM 11.6*  --  10.3 9.9  --   --  8.4* 8.8*  MG  --   --   --   --   --   --   --  1.7  AST 18  --  24 24  --   --   --  77*  ALT 24  --  25 22  --   --   --  32  ALKPHOS 141  --  110 112  --   --   --  112  BILITOT 0.29  --  0.3 0.3  --   --   --  0.7  < > = values in this interval not displayed. ------------------------------------------------------------------------------------------------------------------ estimated creatinine clearance is 63.6 mL/min (by C-G formula based on Cr of  0.93). ------------------------------------------------------------------------------------------------------------------ No results for input(s): HGBA1C in the last 72 hours. ------------------------------------------------------------------------------------------------------------------ No results for input(s): CHOL, HDL, LDLCALC, TRIG, CHOLHDL, LDLDIRECT in the last 72 hours. ------------------------------------------------------------------------------------------------------------------  Recent Labs  05/22/15 1110  TSH 0.814   ------------------------------------------------------------------------------------------------------------------ No results for input(s): VITAMINB12, FOLATE, FERRITIN, TIBC, IRON, RETICCTPCT in the last 72 hours.  Coagulation profile No results for input(s): INR, PROTIME in the last 168 hours.  No results for input(s): DDIMER in the last 72 hours.  Cardiac Enzymes  Recent Labs Lab 05/22/15 1110 05/22/15 1830 05/23/15 0010  TROPONINI 0.49* 0.50* 0.27*   ------------------------------------------------------------------------------------------------------------------ Invalid input(s): POCBNP   Time Spent in minutes   35   Jasper Ruminski K M.D on 05/23/2015 at 9:48 AM  Between 7am to 7pm - Pager - [phone]  After 7pm go to [email or web address] - password [facility]  [facility] -  Office  [phone]

## 2015-05-23 NOTE — Progress Notes (Signed)
   VASCULAR SURGERY ASSESSMENT & PLAN:  * 2 Day Post-Op s/p:  1. Bilateral common femoral artery endarterectomies with bovine pericardial patch angioplasties 2. Bilateral femoral embolectomies 3. Left to right femorofemoral bypass graft (8 mm Dacron graft) 4. 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  SUBJECTIVE: Feet feel better.  PHYSICAL EXAM: Filed Vitals:   05/23/15 0740 05/23/15 0741 05/23/15 0917 05/23/15 0956  BP: 91/59 105/57  116/65  Pulse: 128 128 143 136  Temp: 98.3 F (36.8 C)     TempSrc: Oral     Resp: '26 25 28 24  '$ Height:      Weight:      SpO2: 100% 99% 99% 99%   Both feet are warm. Right foot with good perfusion Left foot looks viable.  Incisions fine.   LABS: Lab Results  Component Value Date   WBC 7.4 05/23/2015   HGB 11.2* 05/23/2015   HCT 33.4* 05/23/2015   MCV 88.1 05/23/2015   PLT 219 05/23/2015   Lab Results  Component Value Date   CREATININE 0.93 05/23/2015   Lab Results  Component Value Date   INR 0.96 03/23/2015   CBG (last 3)   Recent Labs  05/22/15 1504 05/22/15 2128 05/23/15 0849  GLUCAP 181* 154* 150*    Principal Problem:   Arterial occlusion Active Problems:   Hyperlipidemia   Essential hypertension   WOLFF (WOLFE)-PARKINSON-WHITE (WPW) SYNDROME   CAD (coronary artery disease)   Tobacco abuse   Atrial fibrillation with rapid ventricular response   Lung cancer   Cold extremities   Non-small cell carcinoma of lung metastatic to abdomen  Gae Gallop Beeper: [phone] 05/23/2015

## 2015-05-23 NOTE — Assessment & Plan Note (Signed)
Patient with minimal appetite and poor oral intake.  Patient continues to feel dehydrated.  Both sodium and chloride are low per labs drawn on the emergency department just yesterday.  Patient will receive IV fluid rehydration while at the cancer Center today.  He was also encouraged to push fluids at home.

## 2015-05-23 NOTE — Progress Notes (Signed)
SYMPTOM MANAGEMENT CLINIC   HPI: Mark Hurst 64 y.o. male diagnosed with lung cancer; with bone metastasis.  Scheduled to initiate Alimta chemotherapy regimen on 05/23/2015.  Patient presented to the emergency department just yesterday for complaint of progressive low back pain.  He has been taking hydrocodone and tramadol with only minimal effectiveness.  He was given narcotic pain medication while in the emergency department; which did temporarily alleviate his pain.  He denies any urine or stool incontinence.  Patient also has minimal appetite and poor oral intake.  He feels dehydrated again today.  He denies any recent fevers or chills.  Dr. Julien Nordmann has prescribed Fentanyl 25 g patch for patient to start for better pain control.   HPI  ROS  Past Medical History  Diagnosis Date  . WPW (Wolff-Parkinson-White syndrome)     ablated  . DM (dermatomyositis)     x 10 years  . Hyperlipidemia     x 13 years  . Tobacco abuse   . CAD (coronary artery disease)     1997 LAD 95% stenosis. He had Rotablator of small  couple lesions in this astery. His last stress perfusion study was in 2001 with no evidence of ischemia.  . Iliac artery occlusion, right   . Hypertension     x 13 years  . Anginal pain   . Type II diabetes mellitus     Type 2  . Lung cancer     stage 4 - with mets  . Hepatitis A 1972    "in [employer]"  . Shortness of breath dyspnea     Past Surgical History  Procedure Laterality Date  . Small intestine surgery  ~ 1976    following ingestion of a fish bone  . Fracture surgery    . Colon surgery    . Hip fracture surgery Right 1976?    "have steel plate put in"  . Percutaneous coronary rotoblator intervention (pci-r)  ~ 1998  . Coronary angioplasty  ~ 1998  . Cataract extraction w/ intraocular lens  implant, bilateral Bilateral ~ 2002  . Radiofrequency ablation      for WPW  . Video bronchoscopy with endobronchial navigation N/A 03/23/2015    Procedure: VIDEO  BRONCHOSCOPY WITH ENDOBRONCHIAL NAVIGATION;  Surgeon: Collene Gobble, MD;  Location: [facility] OR;  Service: Thoracic;  Laterality: N/A;  . Multiple extractions with alveoloplasty N/A 05/11/2015    Procedure: Extraction of tooth #'s 2,5,6,7,8,11,12,17, 20,21,22,23,24,25,26,27,29 with alveoloplasty;  Surgeon: Lenn Cal, DDS;  Location: [facility] ORS;  Service: Oral Surgery;  Laterality: N/A;  . Genital modification N/A   . Embolectomy Bilateral 05/21/2015    Procedure: Bilateral Femoral Endarterectomies with Bovine Patch Angioplasties; Bilateral Femoral Thrombectomies; Left to Right Femoral to Femoral Bypass Graft; Intraop Arteriograms times two;  Surgeon: Angelia Mould, MD;  Location: [facility] OR;  Service: Vascular;  Laterality: Bilateral;    has Type 2 diabetes mellitus; Hyperlipidemia; Essential hypertension; Coronary atherosclerosis; WOLFF (WOLFE)-PARKINSON-WHITE (WPW) SYNDROME; Chest pain; CAD (coronary artery disease); Lung mass; Solitary pulmonary nodule; Hypoxemia; Tobacco abuse; Other emphysema; PAD (peripheral artery disease); Atrial fibrillation with rapid ventricular response; Lung cancer; Colon polyps; Back pain; Bone metastases; Arterial occlusion; Cold extremities; 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    is allergic to codeine and percocet.    Medication List       This list is accurate as of: 05/19/15 11:59 PM.  Always use your  most recent med list.               aspirin 81 MG tablet  Take 81 mg by mouth daily.     atorvastatin 80 MG tablet  Commonly known as:  LIPITOR  Take 40 mg by mouth daily at 6 PM.     bisacodyl 5 MG EC tablet  Commonly known as:  DULCOLAX  Take 10 mg by mouth daily as needed for mild constipation or moderate constipation.     Cholecalciferol 2000 UNITS Tabs  Take 2,000 Units by mouth daily.     dexamethasone 4 MG tablet  Commonly known as:  DECADRON    Take 1 tablet by mouth twice a day, the day before, the day of and day after chemotheapy     fentaNYL 25 MCG/HR patch  Commonly known as:  DURAGESIC - dosed mcg/hr  Place 1 patch (25 mcg total) onto the skin every 3 (three) days.     Fish Oil Oil  Take 1,000 mg by mouth 2 (two) times daily.     folic acid 1 MG tablet  Commonly known as:  FOLVITE  Take 1 tablet (1 mg total) by mouth daily.     HYDROcodone-acetaminophen 5-325 MG per tablet  Commonly known as:  NORCO  Take 1-2 tablets by mouth every 6 (six) hours as needed (Break thru pain).     insulin regular 100 units/mL injection  Commonly known as:  NOVOLIN R,HUMULIN R  Inject 20 Units into the skin every morning. Due to not eating patient not taking insulin     lisinopril 40 MG tablet  Commonly known as:  PRINIVIL,ZESTRIL  Take 20 mg daily.     loratadine 10 MG tablet  Commonly known as:  CLARITIN  Take 10 mg by mouth daily as needed for allergies.     metFORMIN 500 MG tablet  Commonly known as:  GLUCOPHAGE  Take 500 mg by mouth 2 (two) times daily with a meal. Not taking due to not eating     metoprolol 50 MG tablet  Commonly known as:  LOPRESSOR  Take 50 mg by mouth 2 (two) times daily.     multivitamin tablet  Take 1 tablet by mouth daily.     ondansetron 8 MG tablet  Commonly known as:  ZOFRAN  Take 8 mg by mouth every 8 (eight) hours as needed for nausea or vomiting.     tamsulosin 0.4 MG Caps capsule  Commonly known as:  FLOMAX  Take 0.4 mg by mouth daily.     traMADol 50 MG tablet  Commonly known as:  ULTRAM  Take 1 tablet (50 mg total) by mouth every 6 (six) hours as needed.         PHYSICAL EXAMINATION  Oncology Vitals 05/23/2015 05/23/2015 05/23/2015 05/23/2015 05/23/2015 05/23/2015 05/22/2015  Height - - - - - - -  Weight - - - - 56 kg - -  Weight (lbs) - - - - 123 lbs 7 oz - -  BMI (kg/m2) - - - - 19.34 kg/m2 - -  Temp - - - 98.3 - 98.1 97.4  Pulse 136 143 128 128 - 119 117  Resp 24 28 25 26  -  25 28  SpO2 99 99 99 100 - 98 92  BSA (m2) - - - - 1.63 m2 - -   BP Readings from Last 3 Encounters:  05/23/15 116/65  05/19/15 158/76  05/19/15 167/70    Physical Exam  Constitutional:  He is oriented to person, place, and time.  Patient appears fatigued, weak, frail, and chronically ill.  HENT:  Head: Normocephalic and atraumatic.  Patient just had all of his teeth pulled last week.  No evidence of infection.  Eyes: Conjunctivae and EOM are normal. Pupils are equal, round, and reactive to light. Right eye exhibits no discharge. Left eye exhibits no discharge. No scleral icterus.  Neck: Normal range of motion. Neck supple. No JVD present. No tracheal deviation present. No thyromegaly present.  Cardiovascular: Normal rate, regular rhythm, normal heart sounds and intact distal pulses.   Pulmonary/Chest: Effort normal and breath sounds normal. No respiratory distress. He has no wheezes. He has no rales. He exhibits no tenderness.  Abdominal: Bowel sounds are normal. He exhibits distension. He exhibits no mass. There is no tenderness. There is no rebound and no guarding.  Mild abdominal distention and firmness; most likely secondary to constipation.  Musculoskeletal: Normal range of motion. He exhibits no edema or tenderness.  Lymphadenopathy:    He has no cervical adenopathy.  Neurological: He is alert and oriented to person, place, and time. GCS score is 15.  Skin: Skin is warm and dry. No rash noted. No erythema. There is pallor.  Psychiatric: Affect normal.  Nursing note and vitals reviewed.   LABORATORY DATA:. Admission on 05/18/2015, Discharged on 05/18/2015  Component Date Value Ref Range Status  . WBC 05/18/2015 8.6  4.0 - 10.5 K/uL Final  . RBC 05/18/2015 4.31  4.22 - 5.81 MIL/uL Final  . Hemoglobin 05/18/2015 12.9* 13.0 - 17.0 g/dL Final  . HCT 05/18/2015 37.4* 39.0 - 52.0 % Final  . MCV 05/18/2015 86.8  78.0 - 100.0 fL Final  . MCH 05/18/2015 29.9  26.0 - 34.0 pg Final    . MCHC 05/18/2015 34.5  30.0 - 36.0 g/dL Final  . RDW 05/18/2015 12.8  11.5 - 15.5 % Final  . Platelets 05/18/2015 312  150 - 400 K/uL Final  . Neutrophils Relative % 05/18/2015 81* 43 - 77 % Final  . Neutro Abs 05/18/2015 7.0  1.7 - 7.7 K/uL Final  . Lymphocytes Relative 05/18/2015 6* 12 - 46 % Final  . Lymphs Abs 05/18/2015 0.5* 0.7 - 4.0 K/uL Final  . Monocytes Relative 05/18/2015 12  3 - 12 % Final  . Monocytes Absolute 05/18/2015 1.0  0.1 - 1.0 K/uL Final  . Eosinophils Relative 05/18/2015 1  0 - 5 % Final  . Eosinophils Absolute 05/18/2015 0.1  0.0 - 0.7 K/uL Final  . Basophils Relative 05/18/2015 0  0 - 1 % Final  . Basophils Absolute 05/18/2015 0.0  0.0 - 0.1 K/uL Final  . Sodium 05/18/2015 128* 135 - 145 mmol/L Final  . Potassium 05/18/2015 4.8  3.5 - 5.1 mmol/L Final  . Chloride 05/18/2015 94* 101 - 111 mmol/L Final  . CO2 05/18/2015 22  22 - 32 mmol/L Final  . Glucose, Bld 05/18/2015 301* 65 - 99 mg/dL Final  . BUN 05/18/2015 40* 6 - 20 mg/dL Final  . Creatinine, Ser 05/18/2015 1.06  0.61 - 1.24 mg/dL Final  . Calcium 05/18/2015 10.3  8.9 - 10.3 mg/dL Final  . Total Protein 05/18/2015 7.1  6.5 - 8.1 g/dL Final  . Albumin 05/18/2015 2.8* 3.5 - 5.0 g/dL Final  . AST 05/18/2015 24  15 - 41 U/L Final  . ALT 05/18/2015 25  17 - 63 U/L Final  . Alkaline Phosphatase 05/18/2015 110  38 - 126 U/L Final  . Total  Bilirubin 05/18/2015 0.3  0.3 - 1.2 mg/dL Final  . GFR calc non Af Amer 05/18/2015 >60  >60 mL/min Final  . GFR calc Af Amer 05/18/2015 >60  >60 mL/min Final   Comment: (NOTE) The eGFR has been calculated using the CKD EPI equation. This calculation has not been validated in all clinical situations. eGFR's persistently <60 mL/min signify possible Chronic Kidney Disease.   . Anion gap 05/18/2015 12  5 - 15 Final     RADIOGRAPHIC STUDIES: Ct Head Wo Contrast  05/22/2015   CLINICAL DATA:  Confusion.  EXAM: CT HEAD WITHOUT CONTRAST  TECHNIQUE: Contiguous axial images  were obtained from the base of the skull through the vertex without intravenous contrast.  COMPARISON:  MRI brain 05/05/2015  FINDINGS: Mild diffuse cerebral atrophy. Patchy low-attenuation changes in the deep white matter. This is likely due to small vessel ischemia. No mass effect or midline shift. No abnormal extra-axial fluid collections. Gray-white matter junctions are distinct. Basal cisterns are not effaced. No evidence of acute intracranial hemorrhage. No depressed skull fractures. Visualized paranasal sinuses and mastoid air cells are not opacified. Vascular calcifications.  IMPRESSION: No acute intracranial abnormalities. Mild chronic atrophy and small vessel ischemic changes.   Electronically Signed   By: Lucienne Capers M.D.   On: 05/22/2015 18:27   Dg Ang/ext/uni/or Right  05/22/2015   CLINICAL DATA:  64 year old male with a history of bilateral femoral embolectomy with patch angioplasty.  EXAM: LEFT ANG/EXT/UNI/ OR  CONTRAST:  Op note  FLUOROSCOPY TIME:  Op note  COMPARISON:  No comparison available  FINDINGS: Intraoperative spot images during bilateral femoral embolectomy and patch angioplasty.  Left:  Single image during lower extremity angiogram demonstrates partial opacification of the leg vasculature. Surgical clips present along the medial knee.  Right:  Single image during lower extremity angiogram demonstrates partial opacification of the leg vasculature.  IMPRESSION: Intraoperative angiogram of the left and right lower extremity, with incomplete opacification of the leg vasculature bilaterally.  Please refer to the dictated operative report for full details of intraoperative findings and procedure.  Signed,  Dulcy Fanny. Earleen Newport, DO  [facility]  [facility]   Electronically Signed   By: Corrie Mckusick D.O.   On: 05/22/2015 08:06   Dg Chest Port 1 View  05/21/2015   CLINICAL DATA:  Hypoxia, history of stage IV lung cancer with metastatic disease   EXAM: PORTABLE CHEST - 1 VIEW  COMPARISON:  03/23/2015, 04/07/2015  FINDINGS: Cardiac shadow is stable. Persistent right apical mass lesion is noted consistent with the patient's given clinical history. Patchy infiltrate is noted in the right lung base increased from the prior exam. Additionally diffuse interstitial changes are noted which may represent lymphangitic spread of carcinoma. No sizable effusion is seen. No acute bony abnormality is noted.  IMPRESSION: Stable right apical mass.  Patchy right basilar infiltrate which may represent acute pneumonia.  Diffuse interstitial changes likely related to the underlying neoplasm.   Electronically Signed   By: Inez Catalina M.D.   On: 05/21/2015 08:00   Dg Abd Acute W/chest  05/22/2015   CLINICAL DATA:  Constipation.  Shortness of breath.  Abdominal pain.  EXAM: DG ABDOMEN ACUTE W/ 1V CHEST  COMPARISON:  None.  FINDINGS: There is a moderate to large amount of fecal matter throughout the colon. Small bowel gas pattern is normal. No visible free air. No worrisome calcifications or acute bony findings. Calcification of the aorta is noted. Previous ORIF of right  hip fracture.  One-view chest again shows widespread patchy density and a right apical mass. Patchy pulmonary infiltrate is worsened in general, particularly at the right lung base.  IMPRESSION: Large amount of fecal matter in the colon consistent with the clinical diagnosis of constipation. No evidence of small bowel obstruction or perforation.  Widespread patchy pulmonary densities consistent with pneumonia, somewhat progressive in the right lower lobe.   Electronically Signed   By: Nelson Chimes M.D.   On: 05/22/2015 13:46   Dg Ang/ext/uni/or Left  05/22/2015   CLINICAL DATA:  64 year old male with a history of bilateral femoral embolectomy with patch angioplasty.  EXAM: LEFT ANG/EXT/UNI/ OR  CONTRAST:  Op note  FLUOROSCOPY TIME:  Op note  COMPARISON:  No comparison available  FINDINGS: Intraoperative spot  images during bilateral femoral embolectomy and patch angioplasty.  Left:  Single image during lower extremity angiogram demonstrates partial opacification of the leg vasculature. Surgical clips present along the medial knee.  Right:  Single image during lower extremity angiogram demonstrates partial opacification of the leg vasculature.  IMPRESSION: Intraoperative angiogram of the left and right lower extremity, with incomplete opacification of the leg vasculature bilaterally.  Please refer to the dictated operative report for full details of intraoperative findings and procedure.  Signed,  Dulcy Fanny. Earleen Newport, DO  [facility]  [facility]   Electronically Signed   By: Corrie Mckusick D.O.   On: 05/22/2015 08:06    ASSESSMENT/PLAN:    Cancer associated pain Patient has previously diagnosed bone metastasis.  Patient is complaining of increased back pain.  Patient presented to the emergency department just yesterday 05/18/2015 for same complaint.  He was given narcotic pain medications; which did help relieve some of his back pain for short time.  Patient states that he has been taken hydrocodone and tramadol intermittently with only minimal effectiveness.  Per Dr. Brunilda Payor will be prescribed fentanyl 25 g patches.  He should also continue with hydrocodone for breakthrough pain.    Constipation Patient continues to feel constipated; even though he states that he had one small, hard bowel movement within the past 24 hours.  He has only been using over-the-counter to clots.  CT of the lumbar spine obtained just yesterday did reveal a large stool volume.  Patient was encouraged to continue with the stool softeners twice daily; but he also needs to use Mira lax every 6 hours until he clears his bowels.  Dehydration Patient with minimal appetite and poor oral intake.  Patient continues to feel dehydrated.  Both sodium and chloride are low per labs drawn  on the emergency department just yesterday.  Patient will receive IV fluid rehydration while at the cancer Center today.  He was also encouraged to push fluids at home.  Lung cancer Patient is scheduled to initiate Alimta chemotherapy on Monday, 05/23/2015.  Patient stated understanding of all instructions; and was in agreement with this plan of care. The patient knows to call the clinic with any problems, questions or concerns.   Review/collaboration with Dr. Julien Nordmann regarding all aspects of patient's visit today.   Total time spent with patient was 25 minutes;  with greater than 75 percent of that time spent in face to face counseling regarding patient's symptoms,  and coordination of care and follow up.  Disclaimer: This note was dictated with voice recognition software. Similar sounding words can inadvertently be transcribed and may not be corrected upon review.   Drue Second, NP 05/23/2015

## 2015-05-23 NOTE — Assessment & Plan Note (Signed)
Patient has previously diagnosed bone metastasis.  Patient is complaining of increased back pain.  Patient presented to the emergency department just yesterday 05/18/2015 for same complaint.  He was given narcotic pain medications; which did help relieve some of his back pain for short time.  Patient states that he has been taken hydrocodone and tramadol intermittently with only minimal effectiveness.  Per Dr. Brunilda Payor will be prescribed fentanyl 25 g patches.  He should also continue with hydrocodone for breakthrough pain.

## 2015-05-23 NOTE — Progress Notes (Signed)
VASCULAR LAB PRELIMINARY  ARTERIAL  ABI completed:    RIGHT    LEFT    PRESSURE WAVEFORM  PRESSURE WAVEFORM  BRACHIAL 122 Triphasic BRACHIAL 120 Triphasic  DP 0 Absent DP 0 Absent  PT 75 Biphasic PT 41 monophasic  PER   PER    GREAT TOE  NA GREAT TOE  NA    RIGHT LEFT  ABI 0.61 0.34    Tri Shari Natt D, RVT 05/23/2015, 11:46 AM

## 2015-05-23 NOTE — Progress Notes (Signed)
   05/23/15 1100  Clinical Encounter Type  Visited With Patient;Health care provider;Other (Comment)  Visit Type Initial  Advance Directives (For Healthcare)  Does patient have an advance directive? Yes   Chaplain was paged to patient's room at 10:21 AM. Chaplain was asked to answer questions regarding AD. Chaplain introduced himself to patient and went over the contents of an Advanced Directive. Patient wanted to wait until his brother was present to complete the advanced directive.  After this discussion the patient's caregiver came into the room. This caregiver claims the patient already had a HCPOA filled out recently at [employer] [facility]. Chaplain encouraged the caregiver to get a copy to the Camp. Caregiver also claims the patient had a living will filled out but it is not signed. Neither of this documents were present but chaplain said he was available if they needed future assistance.  Gar Ponto, Chaplain  12:07 PM

## 2015-05-23 NOTE — Assessment & Plan Note (Signed)
Patient continues to feel constipated; even though he states that he had one small, hard bowel movement within the past 24 hours.  He has only been using over-the-counter to clots.  CT of the lumbar spine obtained just yesterday did reveal a large stool volume.  Patient was encouraged to continue with the stool softeners twice daily; but he also needs to use Mira lax every 6 hours until he clears his bowels.

## 2015-05-23 NOTE — Evaluation (Signed)
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 Julien Nordmann, no presented to [facility] ED with main concern of one week duration of progressively worsening bilateral LE pain, LLE > RLE, constant and sharp/10/10 in severity, non radiating, worse with ambulation and even minimal movement, no specific alleviating factors, associated with cold extremities and numbness, malaise.    Clinical Impression   Pt admitted with above. He demonstrates the below listed deficits and will benefit from continued OT to maximize safety and independence with BADLs.  Pt presents to OT with generalized weakness and impaired cognition.  He requires mod A, overall for BADLs, and fatigues quite rapidly with DOE 3/4-4/4.  02 sats remained 95% on RA with HR 123.   Pt's roommate/friend unsure at present if he will be able to provide necessary level of assist at discharge, but their preference is to return home, if able.  Based on eval, recommend SNF level rehab, but if pt progresses to a level that roommate can provide care, then home with [place], [place], aide and, PT, and RN.  '    Follow Up Recommendations  SNF    Equipment Recommendations  3 in 1 bedside comode    Recommendations for Other Services       Precautions / Restrictions Precautions Precautions: Fall Restrictions Weight Bearing Restrictions: No      Mobility Bed Mobility Overal bed mobility: Needs Assistance Bed Mobility: Supine to Sit;Sit to Supine     Supine to sit: Min assist     General bed mobility comments: Mod verbal cues for sequencing and to maintain attention to task.  Min A to  lift trunk from bed   Transfers Overall transfer level: Needs assistance Equipment used: 2 person hand held assist Transfers: Sit to/from [facility] Sit to Stand: Min guard;+2 physical assistance Stand pivot transfers: Min assist;+2 physical assistance       General transfer comment: Pt is impulsive.  requires assist for balance     Balance Overall balance assessment: Needs assistance Sitting-balance support: Feet supported Sitting balance-Leahy Scale: Fair     Standing balance support: Bilateral upper extremity supported Standing balance-Leahy Scale: Poor Standing balance comment: requires UE support and min A                             ADL Overall ADL's : Needs assistance/impaired Eating/Feeding: Independent;Sitting   Grooming: Wash/dry hands;Wash/dry face;Oral care;Set up;Sitting   Upper Body Bathing: Moderate assistance;Sitting   Lower Body Bathing: Moderate assistance;Sit to/from stand   Upper Body Dressing : Moderate assistance;Sitting   Lower Body Dressing: Moderate assistance;Sit to/from stand   Toilet Transfer: Ambulation;Minimal assistance;+2 for physical assistance;RW   Toileting- Clothing Manipulation and Hygiene: Moderate assistance;Sit to/from stand       Functional mobility during ADLs: Minimal assistance;+2 for physical assistance General ADL Comments: Pt fatigues quickly, thus requiring assistance.  Requires cues and assist for safety      Vision     Perception     Praxis      Pertinent Vitals/Pain Pain Assessment: No/denies pain     Hand  Dominance Right   Extremity/Trunk Assessment Upper Extremity Assessment Upper Extremity Assessment: Generalized weakness   Lower Extremity Assessment Lower Extremity Assessment: Defer to PT evaluation   Cervical / Trunk Assessment Cervical / Trunk Assessment: Normal   Communication Communication Communication: No difficulties   Cognition Arousal/Alertness:  Awake/alert;Suspect due to medications Behavior During Therapy: Anxious Overall Cognitive Status: Impaired/Different from baseline Area of Impairment: Attention;Following commands;Safety/judgement   Current Attention Level: Sustained Memory: Decreased short-term memory Following Commands: Follows one step commands with increased time Safety/Judgement: Decreased awareness of safety         General Comments       Exercises       Shoulder Instructions      Home Living Family/patient expects to be discharged to:: Unsure Living Arrangements: Non-relatives/Friends   Type of Home: House Home Access: Stairs to enter Technical brewer of Steps: 2 Entrance Stairs-Rails: Right Home Layout: One level                          Prior Functioning/Environment Level of Independence: Needs assistance  Gait / Transfers Assistance Needed: Per pt and roommate, pt ambulated short distances mod I ADL's / Homemaking Assistance Needed: Per pt and roommate, pt was able to bathe and dress most of the time, but would fatigue and required assist         OT Diagnosis: Generalized weakness;Cognitive deficits   OT Problem List: Decreased strength;Decreased activity tolerance;Impaired balance (sitting and/or standing);Decreased safety awareness;Decreased knowledge of use of DME or AE;Decreased cognition;Cardiopulmonary status limiting activity   OT Treatment/Interventions: Self-care/ADL training;DME and/or AE instruction;Energy conservation;Therapeutic activities;Cognitive remediation/compensation;Patient/family education;Balance training    OT Goals(Current goals can be found in the care plan section) Acute Rehab OT Goals Patient Stated Goal: to get stronger OT Goal Formulation: With patient Time For Goal Achievement: 06/06/15 Potential to Achieve Goals: Good ADL Goals Pt Will Perform Grooming: with min guard assist;standing Pt Will Perform Upper Body Bathing: with  set-up;sitting Pt Will Perform Lower Body Bathing: with min guard assist;sit to/from stand Pt Will Perform Upper Body Dressing: with set-up;sitting Pt Will Perform Lower Body Dressing: with min guard assist;sit to/from stand Pt Will Transfer to Toilet: with min guard assist;ambulating;regular height toilet;bedside commode;grab bars Pt Will Perform Toileting - Clothing Manipulation and hygiene: with min guard assist;sit to/from stand Additional ADL Goal #1: Pt will be independent with energy conservation techniques  OT Frequency: Min 2X/week   Barriers to D/C:    Pt's roommate reports he is unsure if he will be able to provide necessary level of assist at this time - depends on pt progress        Co-evaluation              End of Session Equipment Utilized During Treatment: Rolling walker Nurse Communication: Mobility status  Activity Tolerance: Patient limited by fatigue Patient left: in chair;with call bell/phone within reach;with chair alarm set;with family/visitor present   Time: 1244-1315 OT Time Calculation (min): 31 min Charges:  OT General Charges $OT Visit: 1 Procedure OT Evaluation $Initial OT Evaluation Tier I: 1 Procedure OT Treatments $Self Care/Home Management : 8-22 mins G-Codes:    Apryll Hinkle M June 02, 2015, 1:52 PM

## 2015-05-23 NOTE — Assessment & Plan Note (Signed)
Patient is scheduled to initiate Alimta chemotherapy on Monday, 05/23/2015.

## 2015-05-23 NOTE — Consult Note (Signed)
CARDIOLOGY CONSULT NOTE   Patient ID: Mark Hurst MRN: [id number], DOB/AGE: 1950/11/11   Admit date: 05/21/2015 Date of Consult: 05/23/2015 Reason for Consult: Atrial Fibrillation and Elevated Troponin  Primary Physician: [name], MD Primary Cardiologist: Dr. Percival Spanish  HPI: Mark Hurst is a 64 y.o. male with past medical history of NSCLC (metastatic to spine, mediastinum, and lymph nodes - diagnosed June 2016), CAD (s/p intervention 1997, last Myoview in 03/2015 - normal), WPW (s/p ablation), PAD, HTN, HLD, Type II DM, and history of Atrial Fibrillation (on Eliquis in the past) admitted on 05/21/15 for worsening lower extremity pain and sepsis secondary to PNA.  He underwent emergent bilateral femoral embolectomies on 05/21/15. He appears to have tolerated the procedure well and is currently being followed by Vascular Surgery.  The patient reports he was diagnosed with Atrial Fibrillation in June 2016 and was started on Metoprolol Tartrate and Eliquis at that time. He had been compliant with the medications until approximately 4 weeks ago for he stopped taking the medication prior to having a dental procedure (all of his teeth were removed). He never resumed the medication following the procedure. Of note, he was in sinus rhythm on arrival to the ED on  05/21/2015.   He reports having intermittent palpitations, but says he notices these more when he feels anxious about situations. He denies having any chest pain prior to his admission or since being in the hospital. He does endorse shortness of breath, but says this is not new for him since he started chemotherapy.  Last Nuclear Medicine study was 03/04/2015 showing normal myocardial perfusion and EF was 55-65% at that time. Echocardiogram was also performed in June and showed EF of 60% with no wall motion abnormalities.   Problem List: Past Medical History  Diagnosis Date  . WPW (Wolff-Parkinson-White syndrome)     ablated  . DM  (dermatomyositis)     x 10 years  . Hyperlipidemia     x 13 years  . Tobacco abuse   . CAD (coronary artery disease)     1997 LAD 95% stenosis. He had Rotablator of small  couple lesions in this artery. His last stress perfusion study was in 03/2015 with no evidence of ischemia.  . Iliac artery occlusion, right   . Hypertension     x 13 years  . Anginal pain   . Type II diabetes mellitus     Type 2  . Lung cancer     stage 4 - with mets  . Hepatitis A 1972    "in [employer]"  . Shortness of breath dyspnea     Past Surgical History  Procedure Laterality Date  . Small intestine surgery  ~ 1976    following ingestion of a fish bone  . Fracture surgery    . Colon surgery    . Hip fracture surgery Right 1976?    "have steel plate put in"  . Percutaneous coronary rotoblator intervention (pci-r)  ~ 1998  . Coronary angioplasty  ~ 1998  . Cataract extraction w/ intraocular lens  implant, bilateral Bilateral ~ 2002  . Radiofrequency ablation      for WPW  . Video bronchoscopy with endobronchial navigation N/A 03/23/2015    Procedure: VIDEO BRONCHOSCOPY WITH ENDOBRONCHIAL NAVIGATION;  Surgeon: Collene Gobble, MD;  Location: [facility] OR;  Service: Thoracic;  Laterality: N/A;  . Multiple extractions with alveoloplasty N/A 05/11/2015    Procedure: Extraction of tooth #'s 2,5,6,7,8,11,12,17, 20,21,22,23,24,25,26,27,29 with alveoloplasty;  Surgeon: Jori Moll  Carlos Levering, DDS;  Location: [facility] ORS;  Service: Oral Surgery;  Laterality: N/A;  . Genital modification N/A   . Embolectomy Bilateral 05/21/2015    Procedure: Bilateral Femoral Endarterectomies with Bovine Patch Angioplasties; Bilateral Femoral Thrombectomies; Left to Right Femoral to Femoral Bypass Graft; Intraop Arteriograms times two;  Surgeon: Angelia Mould, MD;  [facility] OR;  Service: Vascular;  Laterality: Bilateral;     Allergies: Allergies  Allergen Reactions  . Codeine Itching  . Percocet [Oxycodone-Acetaminophen] Other (See  Comments)    Sh[place] Kidney pains    Inpatient Medications: . apixaban  5 mg Oral BID  . aspirin  81 mg Oral Daily  . atorvastatin  40 mg Oral q1800  . bisacodyl  10 mg Oral Daily  . digoxin  0.25 mg Intravenous Q6H  . [START ON 05/24/2015] digoxin  0.25 mg Oral Daily  . docusate sodium  200 mg Oral BID  . feeding supplement (ENSURE ENLIVE)  237 mL Oral BID BM  . folic acid  1 mg Oral Daily  . hydrocortisone cream   Topical TID  . insulin aspart  0-5 Units Subcutaneous QHS  . insulin aspart  0-9 Units Subcutaneous TID WC  . levalbuterol  1.25 mg Nebulization TID  . lisinopril  10 mg Oral Daily  . [START ON 05/25/2015] methylnaltrexone  8 mg Subcutaneous QODAY  . metoprolol  25 mg Oral BID  . nicotine  21 mg Transdermal Daily  . pantoprazole  40 mg Oral QHS  . piperacillin-tazobactam (ZOSYN)  IV  3.375 g Intravenous 3 times per day  . [START ON 05/24/2015] pneumococcal 23 valent vaccine  0.5 mL Intramuscular Once  . polyethylene glycol  17 g Oral BID  . tamsulosin  0.4 mg Oral Daily  . vancomycin  750 mg Intravenous Q12H    Family History: Family History  Problem Relation Age of Onset  . Diabetes Mother   . Depression Mother   . Coronary artery disease Father 31  . Lung cancer Father   . Cancer Father   . Colon cancer Mother   . Bladder Cancer Brother   . Esophageal cancer Brother      Social History: Social History   Social History  . Marital Status: Single    Spouse Name: N/A  . Number of Children: 0  . Years of Education: N/A   Occupational History  . Not on file.   Social History Main Topics  . Smoking status: Former Smoker -- 2.00 packs/day for 47 years    Types: Cigarettes    Quit date: 05/02/2015  . Smokeless tobacco: Never Used  . Alcohol Use: 2.4 oz/week    4 Cans of beer, 0 Standard drinks or equivalent per week     Comment: Drinks 2 beers daily- quit 04/2015   . Drug Use: No  . Sexual Activity: No   Other Topics Concern  . Not on file   Social  History Narrative   The patient has a roommate who helps him with shopping and around the house. Raad says he doesn't know what he would do without his friend/roommate.     Review of Systems  General:  No chills, fever, night sweats or weight changes.  Cardiovascular:  No chest pain, edema, orthopnea, or paroxysmal nocturnal dyspnea. Positive for palpitations. Dermatological: No rash, lesions/masses Respiratory: Positive for dyspnea and cough. Urologic: No hematuria, dysuria Abdominal:   No nausea, vomiting, diarrhea, bright red blood per rectum, melena, or hematemesis Neurologic:  No visual  changes, wkns, changes in mental status. All other systems reviewed and are otherwise negative except as noted above.  Physical Exam  Blood pressure 129/67, pulse 136, temperature 97.6 F (36.4 C), temperature source Oral, resp. rate 24, height '5\' 7"'$  (1.702 m), weight 123 lb 7.3 oz (56 kg), SpO2 95 %.  General: Pleasant, NAD, frail appearing male. Psych: Normal affect. Neuro: Alert and oriented X 3. Moves all extremities spontaneously. HEENT: Normal.   Neck: Supple without bruits or JVD. Lungs:  Resp regular and unlabored, wheezing appreciated on auscultation. No rales present. Heart: Tachycardiac, regular rhythm, no s3, s4, or murmurs. Abdomen: Soft, non-tender, BS + x 4.  Extremities: No clubbing, cyanosis or edema. DP/PT/Radials 2+ bilaterally.  Labs   Recent Labs  05/22/15 1110 05/22/15 1830 05/23/15 0010  TROPONINI 0.49* 0.50* 0.27*   Lab Results  Component Value Date   WBC 7.4 05/23/2015   HGB 11.2* 05/23/2015   HCT 33.4* 05/23/2015   MCV 88.1 05/23/2015   PLT 219 05/23/2015     Recent Labs Lab 05/23/15 0010  NA 132*  K 4.3  CL 99*  CO2 22  BUN 21*  CREATININE 0.93  CALCIUM 8.8*  PROT 5.6*  BILITOT 0.7  ALKPHOS 112  ALT 32  AST 77*  GLUCOSE 181*   Lab Results  Component Value Date   CHOL 123 02/01/2011   HDL 37.10* 02/01/2011   LDLCALC 70 02/01/2011    TRIG 82.0 02/01/2011    Radiology/Studies Ct Head Wo Contrast: 05/22/2015   CLINICAL DATA:  Confusion.  EXAM: CT HEAD WITHOUT CONTRAST  TECHNIQUE: Contiguous axial images were obtained from the base of the skull through the vertex without intravenous contrast.  COMPARISON:  MRI brain 05/05/2015  FINDINGS: Mild diffuse cerebral atrophy. Patchy low-attenuation changes in the deep white matter. This is likely due to small vessel ischemia. No mass effect or midline shift. No abnormal extra-axial fluid collections. Gray-white matter junctions are distinct. Basal cisterns are not effaced. No evidence of acute intracranial hemorrhage. No depressed skull fractures. Visualized paranasal sinuses and mastoid air cells are not opacified. Vascular calcifications.  IMPRESSION: No acute intracranial abnormalities. Mild chronic atrophy and small vessel ischemic changes.   Electronically Signed   By: Lucienne Capers M.D.   On: 05/22/2015 18:27   Dg Chest Port 1 View: 05/23/2015   CLINICAL DATA:  Lung cancer, hypertension, shortness of breath  EXAM: PORTABLE CHEST - 1 VIEW  COMPARISON:  05/22/2015  FINDINGS: Bilateral interstitial and alveolar airspace opacities. Small loculated right pleural effusion. No pneumothorax. Stable cardiomediastinal silhouette. No acute osseous abnormality.  IMPRESSION: Bilateral interstitial and alveolar airspace opacities as can be seen with multi lobar pneumonia. Small partial loculated right pleural effusion slightly larger compared with 05/22/2015.   Electronically Signed   By: Kathreen Devoid   On: 05/23/2015 11:27   Dg Abd Acute W/chest: 05/22/2015   CLINICAL DATA:  Constipation.  Shortness of breath.  Abdominal pain.  EXAM: DG ABDOMEN ACUTE W/ 1V CHEST  COMPARISON:  None.  FINDINGS: There is a moderate to large amount of fecal matter throughout the colon. Small bowel gas pattern is normal. No visible free air. No worrisome calcifications or acute bony findings. Calcification of the aorta is  noted. Previous ORIF of right hip fracture.  One-view chest again shows widespread patchy density and a right apical mass. Patchy pulmonary infiltrate is worsened in general, particularly at the right lung base.  IMPRESSION: Large amount of fecal matter in the colon consistent with  the clinical diagnosis of constipation. No evidence of small bowel obstruction or perforation.  Widespread patchy pulmonary densities consistent with pneumonia, somewhat progressive in the right lower lobe.   Electronically Signed   By: Nelson Chimes M.D.   On: 05/22/2015 13:46   Dg Ang/ext/uni/or Left: 05/22/2015   CLINICAL DATA:  64 year old male with a history of bilateral femoral embolectomy with patch angioplasty.  EXAM: LEFT ANG/EXT/UNI/ OR  CONTRAST:  Op note  FLUOROSCOPY TIME:  Op note  COMPARISON:  No comparison available  FINDINGS: Intraoperative spot images during bilateral femoral embolectomy and patch angioplasty.  Left:  Single image during lower extremity angiogram demonstrates partial opacification of the leg vasculature. Surgical clips present along the medial knee.  Right:  Single image during lower extremity angiogram demonstrates partial opacification of the leg vasculature.  IMPRESSION: Intraoperative angiogram of the left and right lower extremity, with incomplete opacification of the leg vasculature bilaterally.  Please refer to the dictated operative report for full details of intraoperative findings and procedure.  Signed,  Dulcy Fanny. Earleen Newport, DO  [facility]  [facility]   Electronically Signed   By: Corrie Mckusick D.O.   On: 05/22/2015 08:06   ECHO: 03/04/2015 Study Conclusions - Left ventricle: The cavity size was normal. Wall thickness was increased increased in a pattern of mild to moderate LVH. The estimated ejection fraction was 60%. Wall motion was normal; there were no regional wall motion abnormalities. - Aortic valve: Not sure if two or three cusps.  The more anterior cusp may dome. There is very slight gradient across the valve. Trivial AI. - Right ventricle: The cavity size was normal. Systolic function was normal.   TELE: Episodes of atrial fibrillation with rate in 140's. Currently in Sinus Tachycardia with rate in 130's.   ASSESSMENT AND PLAN:  1. Paroxysmal Atrial Fibrillation  This patients CHA2DS2-VASc Score and unadjusted Ischemic Stroke Rate (% per year) is equal to 7.2 % stroke rate/year from a score of 5 Above score calculated as 1 point each if present [CHF, HTN, DM, Vascular=MI/PAD/Aortic Plaque, Age if 65-74, or Male] Above score calculated as 2 points each if present [Age > 75, or Stroke/TIA/TE] - on Eliquis in the past, but had been not taking for the past 4 weeks in preparation for dental procedure. Eliquis resumed on 05/22/2015. Continue Eliquis. - Patient has been sinus tachy with rate in the 130's. Increase Metoprolol to '50mg'$  BID (Had been taking Metoprolol '50mg'$  BID at home).  - Digoxin 0.'25mg'$  IV recently added with transition to Digoxin 0.'25mg'$  PO tomorrow.   2. Elevated Troponin  -  This is likely due to demand ischemia in setting of sepsis. - cyclic troponin values have been 0.49, 0.50, and 0.27 respectively. EKG without acute changes. - denies having any chest pain prior to admission or while in the hospital. - patient had nuclear stress test in 03/2015 which was normal and a low-risk study.  - would not pursue ischemic workup at this time due to recent history of negative Myoview and recent diagnosis of metastatic cancer.  - patient has been in sinus tachycardia in the 130's -140's. Will obtain CTA to rule out PE in setting of tachycardia, hypotension, elevated troponin, and hypercoagulability due to oncology diagnosis and recent non-compliance with Eliquis. - continue ASA, BB, and statin.  2. HTN - BP has been 91/57 - 137/67 in the past 24 hours. - Continue Metoprolol.  - Will stop Lisinopril for  now due to recent  increase in Metoprolol.  3. HLD - Continue statin therapy - obtain fasting lipid panel  4. Bilateral lower extremity ischemia - On 05/21/15, underwent emergent vascular surgery and had bilateral common femoral artery endarterectomies with bovine pericardial patch angioplasties, bilateral femoral embolectomies, and left to right femorofemoral bypass graft (8 mm Dacron graft) - According to vascular surgery, this was likely secondary to thrombosis of chronic arterial occlusive disease and not embolic.   Otherwise, per IM.   Signed, Dineen Kid, PA-C 05/23/2015, 2:53 PM

## 2015-05-24 DIAGNOSIS — C3490 Malignant neoplasm of unspecified part of unspecified bronchus or lung: Secondary | ICD-10-CM

## 2015-05-24 DIAGNOSIS — I25118 Atherosclerotic heart disease of native coronary artery with other forms of angina pectoris: Secondary | ICD-10-CM

## 2015-05-24 DIAGNOSIS — I4891 Unspecified atrial fibrillation: Secondary | ICD-10-CM

## 2015-05-24 DIAGNOSIS — Z515 Encounter for palliative care: Secondary | ICD-10-CM

## 2015-05-24 DIAGNOSIS — R0602 Shortness of breath: Secondary | ICD-10-CM | POA: Insufficient documentation

## 2015-05-24 DIAGNOSIS — I749 Embolism and thrombosis of unspecified artery: Secondary | ICD-10-CM

## 2015-05-24 DIAGNOSIS — E440 Moderate protein-calorie malnutrition: Secondary | ICD-10-CM | POA: Insufficient documentation

## 2015-05-24 DIAGNOSIS — I10 Essential (primary) hypertension: Secondary | ICD-10-CM

## 2015-05-24 DIAGNOSIS — Z720 Tobacco use: Secondary | ICD-10-CM

## 2015-05-24 DIAGNOSIS — Z66 Do not resuscitate: Secondary | ICD-10-CM

## 2015-05-24 LAB — LIPID PANEL
Cholesterol: 107 mg/dL (ref 0–200)
HDL: 21 mg/dL — ABNORMAL LOW (ref >40)
LDL Cholesterol: 62 mg/dL (ref 0–99)
Total CHOL/HDL Ratio: 5.1 RATIO
Triglycerides: 121 mg/dL (ref <150)
VLDL: 24 mg/dL (ref 0–40)

## 2015-05-24 LAB — CBC
HEMATOCRIT: 31.3 % — AB (ref 39.0–52.0)
Hemoglobin: 10.6 g/dL — ABNORMAL LOW (ref 13.0–17.0)
MCH: 29.4 pg (ref 26.0–34.0)
MCHC: 33.9 g/dL (ref 30.0–36.0)
MCV: 86.7 fL (ref 78.0–100.0)
PLATELETS: 257 10*3/uL (ref 150–400)
RBC: 3.61 MIL/uL — AB (ref 4.22–5.81)
RDW: 13.3 % (ref 11.5–15.5)
WBC: 8 10*3/uL (ref 4.0–10.5)

## 2015-05-24 LAB — COMPREHENSIVE METABOLIC PANEL
ALT: 30 U/L (ref 17–63)
AST: 54 U/L — ABNORMAL HIGH (ref 15–41)
Albumin: 1.9 g/dL — ABNORMAL LOW (ref 3.5–5.0)
Alkaline Phosphatase: 166 U/L — ABNORMAL HIGH (ref 38–126)
Anion gap: 13 (ref 5–15)
BILIRUBIN TOTAL: 0.8 mg/dL (ref 0.3–1.2)
BUN: 17 mg/dL (ref 6–20)
CHLORIDE: 99 mmol/L — AB (ref 101–111)
CO2: 19 mmol/L — ABNORMAL LOW (ref 22–32)
CREATININE: 0.97 mg/dL (ref 0.61–1.24)
Calcium: 8.5 mg/dL — ABNORMAL LOW (ref 8.9–10.3)
Glucose, Bld: 154 mg/dL — ABNORMAL HIGH (ref 65–99)
Potassium: 4.4 mmol/L (ref 3.5–5.1)
Sodium: 131 mmol/L — ABNORMAL LOW (ref 135–145)
TOTAL PROTEIN: 5.5 g/dL — AB (ref 6.5–8.1)

## 2015-05-24 LAB — GLUCOSE, CAPILLARY
GLUCOSE-CAPILLARY: 128 mg/dL — AB (ref 65–99)
Glucose-Capillary: 159 mg/dL — ABNORMAL HIGH (ref 65–99)
Glucose-Capillary: 133 mg/dL — ABNORMAL HIGH (ref 65–99)
Glucose-Capillary: 139 mg/dL — ABNORMAL HIGH (ref 65–99)
Glucose-Capillary: 113 mg/dL — ABNORMAL HIGH (ref 65–99)

## 2015-05-24 MED ORDER — QUETIAPINE FUMARATE 25 MG PO TABS
25.0000 mg | ORAL_TABLET | Freq: Two times a day (BID) | ORAL | Status: DC
  Administered 2015-05-24 – 2015-05-26 (×4): 25 mg via ORAL
  Filled 2015-05-24 (×6): qty 1

## 2015-05-24 NOTE — Consult Note (Signed)
Consultation Note Date: 05/24/2015   Patient Name: Mark Hurst  DOB: Jan 01, 1951  MRN: [id number]  Age / Sex: 64 y.o., male   PCP: Bernell List, MD Referring Physician: Thurnell Lose, MD  Reason for Consultation: Establishing goals of care  Palliative Care Assessment and Plan Summary of Established Goals of Care and Medical Treatment Preferences    Palliative Care Discussion Held Today:   I met today with Mark Hurst and caregiver, Mark Hurst, at bedside. They tell me that he wants to go home and they now have decided that they want the help of hospice. Mark Hurst is much less anxious this afternoon but still labored breathing but was sleeping when I first entered the room. We discussed what hospice offers and how they help people stay at home when they have serious illness. Mark Hurst says he wants to go home and stay at home with his cat, Mark Hurst, for as long as he has to live (he says he hopes a few weeks). They wish to continue antibiotics and we discussed that the antibiotics may not help as much as they hope d/t all the cancer and issues in his lungs - discussed comfort options for management of symptoms. They do want his health optimized as much as it can be prior to discharge but Mark Hurst would like to go home in the next couple days if possible. Discussed that we may be able to transition to po antibiotics for him to take at home. They also asked for assistance to complete advance directive and be notarized - I have requested notary to assist with this for them today at 4:30 pm. He complains of cough but is not worried or scared of anything else right now. He says he is much less scared than he was this morning when he was informed of his very poor prognosis. I will continue to follow and support while hospitalized.    Contacts/Participants in Discussion: Primary Decision Maker: Self and then his brother (brother not present for conversation today)   Goals of Care/Code Status/Advance Care  Planning:   Code Status: DNR  Goal is to return home with hospice and live out his time there with his cat and friend Mark Hurst.    Symptom Management:   Bowel regimen: Disimpacted today. Continue scheduled Miralax and would recommend Senokot-S 2 tablets qhs.   Pain: He has throbbing 8/10 low back pain relieved by dilaudid 1 mg IV. I would recommend roxanol 10-20 mg every 2 hours so that he may go home with this regimen.   Dyspnea: May also use roxanol for dyspnea control along with nebulizers and oxygen for comfort.   Anxiety: Continue Seroquel BID - he tolerated this well so far today. Did not react well to previous ativan per nursing.   Cough: Agree with Robitussin DM prn but he has not been getting this - may need scheduled dosing.   Psycho-social/Spiritual:   Support System: Friend and caregiver Mark Hurst is very supportive and quit his job to care for him 24/7. Brother and sister involved but live out of town.   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 Julien Nordmann, came to [facility] ED with main concern of one week duration of progressively worsening bilateral LE pain, LLE > RLE, constant and sharp 10/10 in severity, non radiating, worse with ambulation and even minimal movement, no specific alleviating factors, associated with cold extremities and numbness, malaise. Bilateral femoral artery embolization with ischemia requiring bilateral fem embolectomy by Dr. Scot Dock 05/22/15. Also continued respiratory decline with underlying NSCLC and aspiration HCAP. He has also had complications for tachycardia mainly sinus but RN reports  atrial fib overnight.    Primary Diagnoses  Present on Admission:  . Cold extremities . Arterial occlusion . Atrial fibrillation with rapid ventricular response . CAD (coronary artery disease) . Hyperlipidemia . Essential hypertension . WOLFF (WOLFE)-PARKINSON-WHITE (WPW) SYNDROME . Tobacco abuse . 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 The following aspects are pertinent.  Past Medical History  Diagnosis Date  . WPW (Wolff-Parkinson-White syndrome)     ablated  . DM (dermatomyositis)     x 10 years  . Hyperlipidemia     x 13 years  . Tobacco abuse   . CAD (coronary artery disease)     1997 LAD 95% stenosis. He had Rotablator of small  couple lesions in this artery. His last stress perfusion study was in 03/2015 with no evidence of ischemia.  . Iliac artery occlusion, right   . Hypertension     x 13 years  . Anginal pain   . Type II diabetes mellitus     Type 2  . Lung cancer     stage 4 - with mets  . Hepatitis A 1972    "in [employer]"  . Shortness of breath dyspnea   . Atrial fibrillation    Social History   Social History  . Marital Status: Single    Spouse Name: N/A  . Number of Children: 0  . Years of Education: N/A   Social History Main Topics  . Smoking status: Former Smoker -- 2.00 packs/day for 47 years    Types: Cigarettes    Quit date: 05/02/2015  . Smokeless tobacco: Never Used  . Alcohol Use: 2.4 oz/week    4 Cans of beer, 0 Standard drinks or equivalent per week     Comment: Drinks 2 beers daily- quit 04/2015   . Drug Use: No  . Sexual Activity: No   Other Topics Concern  . None   Social History Narrative   The patient has a roommate who helps him with shopping and  around the house. Mark Hurst says he doesn't know what he would do without his friend/roommate.   Family History  Problem Relation Age of Onset  . Diabetes Mother   . Depression Mother   . Coronary artery disease Father 31  . Lung cancer Father   . Cancer Father   . Colon cancer Mother   . Bladder Cancer Brother   . Esophageal cancer Brother    Scheduled Meds: . apixaban  5 mg Oral BID  . aspirin  81 mg Oral Daily  . atorvastatin  40 mg Oral q1800  . bisacodyl  10 mg Oral Daily  . docusate sodium  200 mg Oral BID  . feeding supplement (ENSURE ENLIVE)  237 mL Oral BID BM  . folic acid  1 mg Oral Daily  . hydrocortisone cream   Topical TID  . insulin aspart  0-5 Units Subcutaneous QHS  . insulin aspart  0-9 Units Subcutaneous TID WC  . levalbuterol  1.25 mg Nebulization TID  . [START ON 05/25/2015] methylnaltrexone  8 mg Subcutaneous QODAY  . metoprolol  50 mg Oral BID  . nicotine  21 mg Transdermal Daily  . pantoprazole  40 mg Oral QHS  . piperacillin-tazobactam (ZOSYN)  IV  3.375 g Intravenous 3 times per day  . pneumococcal 23 valent vaccine  0.5 mL Intramuscular Once  . polyethylene glycol  17 g Oral BID  . QUEtiapine  25 mg Oral BID  . tamsulosin  0.4 mg Oral Daily  . vancomycin  750 mg Intravenous Q12H   Continuous Infusions: . sodium chloride 50 mL/hr at 05/23/15 1900   PRN Meds:.alum & mag hydroxide-simeth, guaiFENesin-dextromethorphan, haloperidol lactate, hydrALAZINE, HYDROmorphone (DILAUDID) injection, ipratropium-albuterol, magnesium sulfate 1 - 4 g bolus IVPB, metoprolol, nitroGLYCERIN, ondansetron **OR** ondansetron (ZOFRAN) IV, phenol, potassium chloride, sodium chloride Medications Prior to Admission:  Prior to Admission medications   Medication Sig Start Date End Date Taking? Authorizing Provider  aspirin 81 MG tablet Take 81 mg by mouth daily.     Yes Historical Provider, MD  atorvastatin (LIPITOR) 80 MG tablet Take 40 mg by mouth daily at 6 PM.   Yes Historical  Provider, MD  Cholecalciferol 2000 UNITS TABS Take 2,000 Units by mouth daily.   Yes Historical Provider, MD  ENSURE (ENSURE) Take 237 mLs by mouth 3 (three) times daily.   Yes Historical Provider, MD  fentaNYL (DURAGESIC - DOSED MCG/HR) 25 MCG/HR patch Place 1 patch (25 mcg total) onto the skin every 3 (three) days. 05/18/15  Yes Curt Bears, MD  Fish Oil OIL Take 1,000 mg by mouth 2 (two) times daily.    Yes Historical Provider, MD  folic acid (FOLVITE) 1 MG tablet Take 1 tablet (1 mg total) by mouth daily. 05/16/15  Yes Carlton Adam, PA-C  HYDROcodone-acetaminophen (NORCO) 5-325 MG per tablet Take 1-2 tablets by mouth every 6 (six) hours as needed (Break thru pain). 05/19/15  Yes Susanne Borders, NP  lisinopril (PRINIVIL,ZESTRIL) 40 MG tablet Take 20 mg daily. Patient taking differently: Take 10 mg by mouth daily. Take 20 mg daily. 03/23/15  Yes Collene Gobble, MD  loratadine (CLARITIN) 10 MG tablet Take 10 mg by mouth daily as needed for allergies.    Yes Historical Provider, MD  metFORMIN (GLUCOPHAGE) 500 MG tablet Take 500 mg by mouth 2 (two) times daily with a meal. Not taking due to not eating   Yes Historical Provider, MD  metoprolol (LOPRESSOR) 50 MG tablet Take 50 mg by mouth 2 (two) times daily.    Yes Historical Provider, MD  Multiple Vitamin (MULTIVITAMIN) tablet Take 1 tablet by mouth daily.   Yes Historical Provider, MD  ondansetron (ZOFRAN) 8 MG tablet Take 8 mg by mouth every 8 (eight) hours as needed for nausea or vomiting.    Yes Historical Provider, MD  polyethylene glycol (MIRALAX / GLYCOLAX) packet Take 17 g by mouth 2 (two) times daily as needed for severe constipation.   Yes Historical Provider, MD  tamsulosin (FLOMAX) 0.4 MG CAPS capsule Take 0.4 mg by mouth daily.   Yes Historical Provider, MD  traMADol (ULTRAM) 50 MG tablet Take 1 tablet (50 mg total) by mouth every 6 (six) hours as needed.  05/16/15  Yes Adrena E Johnson, PA-C  dexamethasone (DECADRON) 4 MG tablet Take  1 tablet by mouth twice a day, the day before, the day of and day after chemotheapy Patient not taking: Reported on 05/19/2015 05/16/15   Adrena E Johnson, PA-C  insulin regular (NOVOLIN R,HUMULIN R) 100 units/mL injection Inject 20 Units into the skin every morning. Due to not eating patient not taking insulin    Historical Provider, MD   Allergies  Allergen Reactions  . Codeine Itching  . Percocet [Oxycodone-Acetaminophen] Other (See Comments)    [place] Kidney pains   CBC:    Component Value Date/Time   WBC 8.0 05/24/2015 0220   WBC 8.1 05/16/2015 1139   HGB 10.6* 05/24/2015 0220   HGB 13.8 05/16/2015 1139   HCT 31.3* 05/24/2015 0220   HCT 41.5 05/16/2015 1139   PLT 257 05/24/2015 0220   PLT 316 05/16/2015 1139   MCV 86.7 05/24/2015 0220   MCV 89.0 05/16/2015 1139   NEUTROABS 7.0 05/21/2015 0754   NEUTROABS 6.7* 05/16/2015 1139   LYMPHSABS 0.8 05/21/2015 0754   LYMPHSABS 0.6* 05/16/2015 1139   MONOABS 0.7 05/21/2015 0754   MONOABS 0.6 05/16/2015 1139   EOSABS 0.1 05/21/2015 0754   EOSABS 0.1 05/16/2015 1139   BASOSABS 0.0 05/21/2015 0754   BASOSABS 0.1 05/16/2015 1139   Comprehensive Metabolic Panel:    Component Value Date/Time   NA 131* 05/24/2015 0220   NA 128* 05/16/2015 1140   K 4.4 05/24/2015 0220   K 5.3* 05/16/2015 1140   CL 99* 05/24/2015 0220   CO2 19* 05/24/2015 0220   CO2 26 05/16/2015 1140   BUN 17 05/24/2015 0220   BUN 33.7* 05/16/2015 1140   CREATININE 0.97 05/24/2015 0220   CREATININE 1.3 05/16/2015 1140   GLUCOSE 154* 05/24/2015 0220   GLUCOSE 335* 05/16/2015 1140   CALCIUM 8.5* 05/24/2015 0220   CALCIUM 11.6* 05/16/2015 1140   AST 54* 05/24/2015 0220   AST 18 05/16/2015 1140   ALT 30 05/24/2015 0220   ALT 24 05/16/2015 1140   ALKPHOS 166* 05/24/2015 0220   ALKPHOS 141 05/16/2015 1140   BILITOT 0.8 05/24/2015 0220   BILITOT 0.29 05/16/2015 1140   PROT 5.5* 05/24/2015 0220   PROT 7.5 05/16/2015 1140   ALBUMIN 1.9* 05/24/2015 0220    ALBUMIN 2.8* 05/16/2015 1140    Physical Exam:  Vital Signs: BP 130/79 mmHg  Pulse 126  Temp(Src) 97.9 F (36.6 C) (Oral)  Resp 26  Ht 5' 7"  (1.702 m)  Wt 57.9 kg (127 lb 10.3 oz)  BMI 19.99 kg/m2  SpO2 94% SpO2: SpO2: 94 % O2 Device: O2 Device: Nasal Cannula O2 Flow Rate: O2 Flow Rate (L/min): 6 L/min (found pt at 6 LPM sat 94%) Intake/output summary:  Intake/Output Summary (Last 24 hours) at 05/24/15 1444 Last data filed at 05/24/15 0800  Gross per 24 hour  Intake 975.83 ml  Output      0 ml  Net 975.83 ml   LBM: Last BM Date: 05/23/15 Baseline Weight: Weight: 58.7 kg (129 lb 6.6 oz) Most recent weight: Weight: 57.9 kg (127 lb 10.3 oz)  Exam Findings:   General: Thin, frail, mod distressed, lying in bed HEENT: [place]/AT, temporal muscle wasting CVS: Tachycardic Resp: Labored but improved from earlier today Neuro: Awake, alert, oriented x 3 but confused in conversation at times           Palliative Performance Scale: 20 %  Additional Data Reviewed: Recent Labs     05/23/15  0010  05/24/15  0220  WBC  7.4  8.0  HGB  11.2*  10.6*  PLT  219  257  NA  132*  131*  BUN  21*  17  CREATININE  0.93  0.97     Time In: 1330 Time Out: 1445 Time Total: 42mn  Greater than 50%  of this time was spent counseling and coordinating care related to the above assessment and plan.   Signed by:  AVinie Sill NP Palliative Medicine Team Pager # [phone](M-F 8a-5p) Team Phone # [phone](Nights/Weekends)

## 2015-05-24 NOTE — Progress Notes (Signed)
Physical Therapy Treatment Patient Details Name: Mark Hurst MRN: [id number] DOB: 07-18-1951 Today's Date: 05/24/2015    History of Present Illness 64 yo male with HTN, HLD, DM, long history of smoking 2 ppd, recently diagnosed NSLC metastatic to the spine, mediastinum, lymph nodes, history of A-fib (has been on Eliquis but was on hold per pt due to requiring procedures but pt not sure which ones)) follows with Dr. Julien Nordmann, no presented to [facility] ED with main concern of one week duration of progressively worsening bilateral LE pain, LLE > RLE, constant and sharp/10/10 in severity, non radiating, worse with ambulation and even minimal movement, no specific alleviating factors, associated with cold extremities and numbness, malaise.     PT Comments    Progressing slowly, limited by dyspnea with minimal exertion more than pain from surgery.  Follow Up Recommendations  SNF (not progressing quickly)     Equipment Recommendations  None recommended by PT    Recommendations for Other Services       Precautions / Restrictions Precautions Precautions: Fall Precaution Comments: up with assist    Mobility  Bed Mobility Overal bed mobility: Needs Assistance Bed Mobility: Rolling;Sidelying to Sit Rolling: Min assist Sidelying to sit: Mod assist       General bed mobility comments: cues for sequencing, minimal truncal assist  Transfers Overall transfer level: Needs assistance Equipment used: 1 person hand held assist Transfers: Sit to/from [facility] Sit to Stand: Min assist Stand pivot transfers: Min assist       General transfer comment: awaits assist, cues for hand placement and assist to come forward.  Ambulation/Gait Ambulation/Gait assistance: Min assist Ambulation Distance (Feet): 32 Feet Assistive device: Rolling walker (2 wheeled) Gait Pattern/deviations: Step-through pattern Gait velocity: slower   General Gait Details: mildly staggery steps, safe  use of the RW.  very quick to fatigue and become dyspneic   Stairs            Wheelchair Mobility    Modified Rankin (Stroke Patients Only)       Balance Overall balance assessment: Needs assistance Sitting-balance support: No upper extremity supported Sitting balance-Leahy Scale: Fair     Standing balance support: Single extremity supported;Bilateral upper extremity supported Standing balance-Leahy Scale: Poor                      Cognition Arousal/Alertness: Awake/alert Behavior During Therapy: Anxious Overall Cognitive Status: Within Functional Limits for tasks assessed (still mildly impaired)     Current Attention Level: Selective   Following Commands: Follows one step commands with increased time Safety/Judgement: Decreased awareness of safety          Exercises      General Comments General comments (skin integrity, edema, etc.): DOE 3/4: sats on 6L maintained at 91-93%, but EHR climbing toward the 130 bpm mark with pt looking in distress, so further treatment aborted.      Pertinent Vitals/Pain Pain Assessment: Faces Faces Pain Scale: Hurts little more Pain Location: anus and lower abdomen Pain Descriptors / Indicators: Aching;Burning Pain Intervention(s): Patient requesting pain meds-RN notified;Limited activity within patient's tolerance    Home Living                      Prior Function            PT Goals (current goals can now be found in the care plan section) Acute Rehab PT Goals Patient Stated Goal: to get stronger PT Goal Formulation:  Patient unable to participate in goal setting Time For Goal Achievement: 05/29/15 Potential to Achieve Goals: Fair Progress towards PT goals: Progressing toward goals    Frequency  Min 3X/week    PT Plan Current plan remains appropriate    Co-evaluation             End of Session Equipment Utilized During Treatment: Oxygen Activity Tolerance: Patient limited by  fatigue Patient left: in chair;with call bell/phone within reach;with nursing/sitter in room     Time: 1454-1520 PT Time Calculation (min) (ACUTE ONLY): 26 min  Charges:  $Gait Training: 8-22 mins $Therapeutic Activity: 8-22 mins                    G Codes:      Igor Bishop, Tessie Fass 05/24/2015, 3:35 PM 05/24/2015  Donnella Sham, PT [phone]  (pager)

## 2015-05-24 NOTE — Progress Notes (Signed)
I have attempted to visit with Mr. Mark Hurst and caregiver at bedside. Mr. Mark Hurst is very anxious which seems to be affecting his breathing so I have contacted Dr. Candiss Norse to assist with anxiety management - recommend trial of low dose Xanax. Mr. Mark Hurst declined visit even though I explained I am not with hospice and my goal is to help him reach his goals and to assist with pain/anxiety management. He did give me permission to check in with him later today. Thank you for this consult.   Vinie Sill, NP Palliative Medicine Team Pager # [phone] (M-F 8a-5p) Team Phone # [phone] (Nights/Weekends)

## 2015-05-24 NOTE — Progress Notes (Signed)
Patient: Mark Hurst / Admit Date: 05/21/2015 / Date of Encounter: 05/24/2015, 10:25 AM   Subjective: Continued SOB. Does report occasional palpitations last night.   Objective: Telemetry: NSR/sinus tach this AM with occasional PVCs - last night had paroxysms of rapid atrial fib into the 170s Physical Exam: Blood pressure 130/79, pulse 126, temperature 97.9 F (36.6 C), temperature source Oral, resp. rate 26, height '5\' 7"'$  (1.702 m), weight 127 lb 10.3 oz (57.9 kg), SpO2 94 %. General: Cachectic appearing WM in no acute distress. Head: Normocephalic, atraumatic, sclera non-icteric, no xanthomas, nares are without discharge. Neck:  JVP not elevated. Lungs: Coarse BS throughout, decreased right base. Breathing is unlabored. Heart: Reg rhythm, elevated rate, S1 S2 without murmurs, rubs, or gallops.  Abdomen: Soft, non-tender, non-distended with normoactive bowel sounds. No rebound/guarding. Extremities: No clubbing or cyanosis. No edema. Warm bilaterally. Neuro: Alert and oriented X 3. Moves all extremities spontaneously. Psych:  Responds to questions appropriately with a normal affect.   Intake/Output Summary (Last 24 hours) at 05/24/15 1025 Last data filed at 05/24/15 0800  Gross per 24 hour  Intake   1290 ml  Output    200 ml  Net   1090 ml    Inpatient Medications:  . apixaban  5 mg Oral BID  . aspirin  81 mg Oral Daily  . atorvastatin  40 mg Oral q1800  . bisacodyl  10 mg Oral Daily  . digoxin  0.25 mg Oral Daily  . docusate sodium  200 mg Oral BID  . feeding supplement (ENSURE ENLIVE)  237 mL Oral BID BM  . folic acid  1 mg Oral Daily  . hydrocortisone cream   Topical TID  . insulin aspart  0-5 Units Subcutaneous QHS  . insulin aspart  0-9 Units Subcutaneous TID WC  . levalbuterol  1.25 mg Nebulization TID  . [START ON 05/25/2015] methylnaltrexone  8 mg Subcutaneous QODAY  . metoprolol  50 mg Oral BID  . nicotine  21 mg Transdermal Daily  . pantoprazole  40 mg Oral  QHS  . piperacillin-tazobactam (ZOSYN)  IV  3.375 g Intravenous 3 times per day  . pneumococcal 23 valent vaccine  0.5 mL Intramuscular Once  . polyethylene glycol  17 g Oral BID  . tamsulosin  0.4 mg Oral Daily  . vancomycin  750 mg Intravenous Q12H   Infusions:  . sodium chloride 50 mL/hr at 05/23/15 1900    Labs:  Recent Labs  05/23/15 0010 05/24/15 0220  NA 132* 131*  K 4.3 4.4  CL 99* 99*  CO2 22 19*  GLUCOSE 181* 154*  BUN 21* 17  CREATININE 0.93 0.97  CALCIUM 8.8* 8.5*  MG 1.7  --     Recent Labs  05/23/15 0010 05/24/15 0220  AST 77* 54*  ALT 32 30  ALKPHOS 112 166*  BILITOT 0.7 0.8  PROT 5.6* 5.5*  ALBUMIN 2.2* 1.9*    Recent Labs  05/23/15 0010 05/24/15 0220  WBC 7.4 8.0  HGB 11.2* 10.6*  HCT 33.4* 31.3*  MCV 88.1 86.7  PLT 219 257    Recent Labs  05/22/15 1110 05/22/15 1830 05/23/15 0010  TROPONINI 0.49* 0.50* 0.27*   Invalid input(s): POCBNP  Recent Labs  05/21/15 2200  HGBA1C 8.1*     Radiology/Studies:  Ct Head Wo Contrast  05/22/2015   CLINICAL DATA:  Confusion.  EXAM: CT HEAD WITHOUT CONTRAST  TECHNIQUE: Contiguous axial images were obtained from the base of the skull through the  vertex without intravenous contrast.  COMPARISON:  MRI brain 05/05/2015  FINDINGS: Mild diffuse cerebral atrophy. Patchy low-attenuation changes in the deep white matter. This is likely due to small vessel ischemia. No mass effect or midline shift. No abnormal extra-axial fluid collections. Gray-white matter junctions are distinct. Basal cisterns are not effaced. No evidence of acute intracranial hemorrhage. No depressed skull fractures. Visualized paranasal sinuses and mastoid air cells are not opacified. Vascular calcifications.  IMPRESSION: No acute intracranial abnormalities. Mild chronic atrophy and small vessel ischemic changes.   Electronically Signed   By: Lucienne Capers M.D.   On: 05/22/2015 18:27   Ct Angio Chest Pe W/cm &/or Wo Cm  05/23/2015    CLINICAL DATA:  Shortness of breath. Lung cancer with metastasis. Hypercoagulability.  EXAM: CT ANGIOGRAPHY CHEST WITH CONTRAST  TECHNIQUE: Multidetector CT imaging of the chest was performed using the standard protocol during bolus administration of intravenous contrast. Multiplanar CT image reconstructions and MIPs were obtained to evaluate the vascular anatomy.  CONTRAST:  43m OMNIPAQUE IOHEXOL 350 MG/ML SOLN  COMPARISON:  03/23/2015  FINDINGS: Technically adequate study with good opacification of the central and segmental pulmonary arteries. No focal filling defects demonstrated. No evidence of significant pulmonary embolus. There is right hilar lymphadenopathy which causes focal constriction of the right main pulmonary artery but the vessel remains patent despite focal stenosis.  Normal heart size. Normal caliber thoracic aorta. Aortic calcification. No aortic dissection. Coronary artery calcification. Esophagus is decompressed. Pretracheal lymphadenopathy measures 14 mm short axis dimension. Right hilar lymphadenopathy measures up to 17 mm diameter.  Right upper lung mass is again demonstrated in appears larger than the prior study, although additional disease is present in the right lung which makes measurement difficult. The mass appears to measure about 3.7 x 4.1 cm today. Focal cavitation in the lesion. Lesion abuts the pleural surface and there is associated pleural thickening. Pleural invasion is not excluded.  Since the previous study, there is interval development of a large right pleural effusion and a small left pleural effusion. Interval development of diffuse patchy interstitial nodular changes throughout both lungs, probably representing lymphangitic tumor spread. Diffuse bronchial wall thickening. Diffuse bronchitis with with diffuse patchy bronchopneumonia not excluded. No pneumothorax.  Included portions of the upper abdominal organs demonstrate increased nodular soft tissue density in the  left suprarenal space, incompletely included within the study. This measures about 3.6 cm maximal diameter. With the normal adrenal gland is not demonstrated in this likely represents a new adrenal gland metastasis. However, due to the field of view, the it is also possible this is just the top of the kidney. Visualized portion of the right adrenal gland is unremarkable in the other upper abdominal organs visualize are grossly unremarkable. Degenerative changes in the spine. Mild compression of a mid thoracic vertebra, new since prior study. This may indicate developing bone metastasis.  Review of the MIP images confirms the above findings.  IMPRESSION: No evidence of significant pulmonary embolus although there is focal nonocclusive stenosis of the right main pulmonary artery caused by right hilar lymphadenopathy. Since prior study, there is enlargement of the right apical mass lesion with small focal cavitation. Pleural thickening may indicate pleural involvement. New large right pleural effusion and small left pleural effusion. Diffuse nodular interstitial pattern throughout both lungs with bronchial wall thickening. This is likely to represent lymphangitic tumor metastases but diffuse bronchiolitis and bronchopneumonia is not excluded. Possible developing left adrenal gland metastasis although incompletely included on the study. New central endplate  compression of a mid thoracic vertebra a may indicate bone metastasis.   Electronically Signed   By: Lucienne Capers M.D.   On: 05/23/2015 22:15   Ct Lumbar Spine Wo Contrast  05/18/2015   CLINICAL DATA:  Low back pain.  Constipation.  EXAM: CT LUMBAR SPINE WITHOUT CONTRAST  TECHNIQUE: Multidetector CT imaging of the lumbar spine was performed without intravenous contrast administration. Multiplanar CT image reconstructions were also generated.  COMPARISON:  PET-CT 04/07/2015  FINDINGS: On scout imaging there is constipation with stool distending the entire  colon.  Lumbar spine:  No acute fracture, endplate erosion, or traumatic malalignment. Bone metastases are not appreciated, including a large deposit in the L2 body seen on previous PET-CT.  Right L5 pars defect without slip.  Mild disc bulging at L4-5 and L5-S1 without suspected impingement. Mild moderate left facet arthropathy at L4-5 and L5-S1. No gross canal hematoma.  Small right pleural effusion. Right adrenal thickening, likely metastatic deposit based on previous PET-CT. Abdominal aortic aneurysm with a 34 mm maximal diameter. Attenuated appearance of the right common and external iliac arteries consistent with chronic occlusion.  Pelvis: Status post open reduction internal fixation of the proximal right femur. There is no evidence of periprosthetic fracture or hardware loosening. No acute fracture or diastasis. Metastases within the right pelvis on prior PET-CT are not visible by CT.  Mild osteoarthritic spurring about the sacroiliac joints. No notable hip degenerative change.  Sigmoid diverticulosis.  IMPRESSION: 1. No acute finding in the lumbar spine or pelvis. 2. Osseous metastases seen on PET-CT 04/07/2015 are not visible by CT. 3. Large stool volume. 4. Right pleural effusion is small but increased from July 2016.   Electronically Signed   By: Monte Fantasia M.D.   On: 05/18/2015 02:52   Ct Pelvis Wo Contrast  05/18/2015   CLINICAL DATA:  Low back pain.  Constipation.  EXAM: CT LUMBAR SPINE WITHOUT CONTRAST  TECHNIQUE: Multidetector CT imaging of the lumbar spine was performed without intravenous contrast administration. Multiplanar CT image reconstructions were also generated.  COMPARISON:  PET-CT 04/07/2015  FINDINGS: On scout imaging there is constipation with stool distending the entire colon.  Lumbar spine:  No acute fracture, endplate erosion, or traumatic malalignment. Bone metastases are not appreciated, including a large deposit in the L2 body seen on previous PET-CT.  Right L5 pars  defect without slip.  Mild disc bulging at L4-5 and L5-S1 without suspected impingement. Mild moderate left facet arthropathy at L4-5 and L5-S1. No gross canal hematoma.  Small right pleural effusion. Right adrenal thickening, likely metastatic deposit based on previous PET-CT. Abdominal aortic aneurysm with a 34 mm maximal diameter. Attenuated appearance of the right common and external iliac arteries consistent with chronic occlusion.  Pelvis: Status post open reduction internal fixation of the proximal right femur. There is no evidence of periprosthetic fracture or hardware loosening. No acute fracture or diastasis. Metastases within the right pelvis on prior PET-CT are not visible by CT.  Mild osteoarthritic spurring about the sacroiliac joints. No notable hip degenerative change.  Sigmoid diverticulosis.  IMPRESSION: 1. No acute finding in the lumbar spine or pelvis. 2. Osseous metastases seen on PET-CT 04/07/2015 are not visible by CT. 3. Large stool volume. 4. Right pleural effusion is small but increased from July 2016.   Electronically Signed   By: Monte Fantasia M.D.   On: 05/18/2015 02:52   Mr Jeri Cos ER Contrast  05/05/2015   CLINICAL DATA:  64 year old male with stage  IV non-small cell lung cancer diagnosed in June. Staging. Subsequent encounter.  EXAM: MRI HEAD WITHOUT AND WITH CONTRAST  TECHNIQUE: Multiplanar, multiecho pulse sequences of the brain and surrounding structures were obtained without and with intravenous contrast.  CONTRAST:  73m MULTIHANCE GADOBENATE DIMEGLUMINE 529 MG/ML IV SOLN  COMPARISON:  PET-CT 04/07/2015.  FINDINGS: There is a small triangular focus of T2 and FLAIR hyperintensity in the right cerebellum (series 6, image 7) associated with curvilinear postcontrast enhancement (series 11, image 12). However, this more resembles a small area of encephalomalacia than a mass. No associated hemosiderin. Diffusion here is facilitated.  No other abnormal enhancement identified.  No  dural thickening.  Patchy T2 hyperintensity in the pons. No supratentorial encephalomalacia, with normal for age hemispheric gray and white matter signal.  No restricted diffusion to suggest acute infarction. No intracranial mass effect, ventriculomegaly, extra-axial collection or acute intracranial hemorrhage. Cervicomedullary junction and pituitary are within normal limits. Grossly negative visualized cervical spine.  Visualized bone marrow signal is within normal limits. Negative scalp soft tissues. Orbits soft tissues are normal aside from postoperative changes to the globes. Visible internal auditory structures appear normal. Visualized paranasal sinuses and mastoids are clear.  IMPRESSION: 1. A small 7 mm focus of signal abnormality and enhancement in the right cerebellar hemisphere more resembles sequelae of small vessel ischemia or a small benign vascular malformation than a small metastasis. Recommend repeat study to document stability in 3 months, unless clinically indicated sooner. 2. Otherwise no acute or metastatic intracranial process.   Electronically Signed   By: HGenevie AnnM.D.   On: 05/05/2015 14:06   Dg Ang/ext/uni/or Right  05/22/2015   CLINICAL DATA:  64year old male with a history of bilateral femoral embolectomy with patch angioplasty.  EXAM: LEFT ANG/EXT/UNI/ OR  CONTRAST:  Op note  FLUOROSCOPY TIME:  Op note  COMPARISON:  No comparison available  FINDINGS: Intraoperative spot images during bilateral femoral embolectomy and patch angioplasty.  Left:  Single image during lower extremity angiogram demonstrates partial opacification of the leg vasculature. Surgical clips present along the medial knee.  Right:  Single image during lower extremity angiogram demonstrates partial opacification of the leg vasculature.  IMPRESSION: Intraoperative angiogram of the left and right lower extremity, with incomplete opacification of the leg vasculature bilaterally.  Please refer to the dictated operative  report for full details of intraoperative findings and procedure.  Signed,  JDulcy Fanny WEarleen Newport DO  [facility]  [facility]   Electronically Signed   By: JCorrie MckusickD.O.   On: 05/22/2015 08:06   Dg Chest Port 1 View  05/23/2015   CLINICAL DATA:  Lung cancer, hypertension, shortness of breath  EXAM: PORTABLE CHEST - 1 VIEW  COMPARISON:  05/22/2015  FINDINGS: Bilateral interstitial and alveolar airspace opacities. Small loculated right pleural effusion. No pneumothorax. Stable cardiomediastinal silhouette. No acute osseous abnormality.  IMPRESSION: Bilateral interstitial and alveolar airspace opacities as can be seen with multi lobar pneumonia. Small partial loculated right pleural effusion slightly larger compared with 05/22/2015.   Electronically Signed   By: HKathreen Devoid  On: 05/23/2015 11:27   Dg Chest Port 1 View  05/21/2015   CLINICAL DATA:  Hypoxia, history of stage IV lung cancer with metastatic disease  EXAM: PORTABLE CHEST - 1 VIEW  COMPARISON:  03/23/2015, 04/07/2015  FINDINGS: Cardiac shadow is stable. Persistent right apical mass lesion is noted consistent with the patient's given clinical history. Patchy infiltrate is noted in the right lung  base increased from the prior exam. Additionally diffuse interstitial changes are noted which may represent lymphangitic spread of carcinoma. No sizable effusion is seen. No acute bony abnormality is noted.  IMPRESSION: Stable right apical mass.  Patchy right basilar infiltrate which may represent acute pneumonia.  Diffuse interstitial changes likely related to the underlying neoplasm.   Electronically Signed   By: Inez Catalina M.D.   On: 05/21/2015 08:00   Dg Abd Acute W/chest  05/22/2015   CLINICAL DATA:  Constipation.  Shortness of breath.  Abdominal pain.  EXAM: DG ABDOMEN ACUTE W/ 1V CHEST  COMPARISON:  None.  FINDINGS: There is a moderate to large amount of fecal matter throughout the colon. Small bowel  gas pattern is normal. No visible free air. No worrisome calcifications or acute bony findings. Calcification of the aorta is noted. Previous ORIF of right hip fracture.  One-view chest again shows widespread patchy density and a right apical mass. Patchy pulmonary infiltrate is worsened in general, particularly at the right lung base.  IMPRESSION: Large amount of fecal matter in the colon consistent with the clinical diagnosis of constipation. No evidence of small bowel obstruction or perforation.  Widespread patchy pulmonary densities consistent with pneumonia, somewhat progressive in the right lower lobe.   Electronically Signed   By: Nelson Chimes M.D.   On: 05/22/2015 13:46   Dg Ang/ext/uni/or Left  05/22/2015   CLINICAL DATA:  64 year old male with a history of bilateral femoral embolectomy with patch angioplasty.  EXAM: LEFT ANG/EXT/UNI/ OR  CONTRAST:  Op note  FLUOROSCOPY TIME:  Op note  COMPARISON:  No comparison available  FINDINGS: Intraoperative spot images during bilateral femoral embolectomy and patch angioplasty.  Left:  Single image during lower extremity angiogram demonstrates partial opacification of the leg vasculature. Surgical clips present along the medial knee.  Right:  Single image during lower extremity angiogram demonstrates partial opacification of the leg vasculature.  IMPRESSION: Intraoperative angiogram of the left and right lower extremity, with incomplete opacification of the leg vasculature bilaterally.  Please refer to the dictated operative report for full details of intraoperative findings and procedure.  Signed,  Dulcy Fanny. Earleen Newport, DO  [facility]  [facility]   Electronically Signed   By: Corrie Mckusick D.O.   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 Patient stopped medication for dental extraction 4 weeks ago and had never resumed following the procedure. Cardiology was asked to see due to elevated troponin and elevated HR.  1. H/o paroxysmal atrial fibrillation - when cardiology was initially consulted 05/23/15, patient only had sinus tach on telemetry thus digoxin was discontinued - overnight he did have paroxysms of rapid AF into the 170s - Eliquis resumed on 05/22/2015 - continue as tolerated - will discuss strategies for HR control with MD. Given intermittent wheezing and normal EF we could consider adding short-acting cardizem for now... but overall HR is mostly driven by underlying medical illness including resp status -  CTA 05/24/15 showing no PE but + focal nonocclusive stenosis of the right main pulmonary artery caused by right hilar lymphadenopathy,  enlargement of right apical mass lesion with small focal cavitation, new large right pleural effusion, small left effusion, diffuse nodular pattern in both lungs likely to represent lymphangitic tumor metastases but diffuse bronchiolitis and bronchopneumonia is not excluded - pleural effusion is likely to be pulm in etiology rather than cardiac. Consider pulm/onc consult to see if thoracentesis would be worthwhile.  2. Elevated troponin felt due to demand ischemia in the setting of sepsis and known CAD (do not feel this is NSTEMI) - recent nuclear stress test in 03/2015 which was normal and a low-risk study - would not pursue ischemic workup at this time due to recent history of negative Myoview and recent diagnosis of metastatic cancer and overall poor prognosis  3. HTN - follow  4. Bilateral LE ischemia - On 05/21/15, underwent emergent vascular surgery and had  bilateral common femoral artery endarterectomies with bovine pericardial patch angioplasties, bilateral femoral embolectomies, and left to right femorofemoral bypass graft (8 mm Dacron graft) - According to vascular surgery, this was likely secondary to thrombosis of chronic arterial occlusive disease and not embolic  Signed, Melina Copa PA-C Pager: [phone]  I have seen and examined the patient along with Melina Copa PA-C.  I have reviewed the chart, notes and new data.  I agree with PA's note.   PLAN: Progression of multiple medical conditions noted. Note oncology recommendations for full hospice. Further aggressive cardiac evaluation and intervention is likely not warranted.  Sanda Klein, MD, [place] [phone] 05/24/2015, 12:55 PM

## 2015-05-24 NOTE — Progress Notes (Signed)
Patient Demographics:    Mark Hurst, is a 64 y.o. male, DOB - Jun 13, 1951, TUU:[id number]  Admit date - 05/21/2015   Admitting Physician Theodis Blaze, MD  Outpatient Primary MD for the patient is [name], MD  LOS - 3   Chief Complaint  Patient presents with  . Leg Pain  . Constipation        Subjective:    [name] today has, No headache, No chest pain, No abdominal pain - No Nausea, No new weakness tingling or numbness, No Cough - does have some shortness of breath and plains of severe constipation. Chronic left leg weakness.   Assessment  & Plan :     1. Bilateral femoral artery embolization with ischemia - he is post by lateral femoral embolectomy by Dr. Doren Custard on 05/22/2015, has chronic left leg weakness for which PT and OT will be consulted. Continue supportive care. Likely embolization due to underlying history of paroxysmal atrial fibrillation along with lung cancer with hypercoagulable state. Placed on Eliquis, continue aspirin for now will stop if okay by vascular surgery.   2. Paroxysmal atrial fibrillation with RVR. [place] Vasc 2 score now least greater than 4. Embolization to bilateral femoral artery, was on Eliquis what was held for the last 2-3 weeks for a dental procedure which has been long done, has been placed on Lopressor, cardiology following, recent echocardiogram noted with preserved EF of 60%. Eliquis has been resumed pharmacy monitoring.    3. 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 He was to undergo palliative chemotherapy in the future. Discussed his prognosis with his primary oncologist Dr. Julien Nordmann agrees with transitioning to full hospice and comfort care. Palliative care has been consulted.  His prognosis appears very bleak.   4. Acute hypoxic respiratory failure due to Sepsis due Aspiration HCAP. Placed on appropriate anti-biotics. Monitor cultures, supportive care with oxygen and nebulizer treatments as needed. Clinical possible mild fluid overload will give a trial of IV Lasix as well. Not showing signs of improvement and overall continues to decline, Discussed his prognosis with his primary oncologist Dr. Julien Nordmann agrees with transitioning to full hospice and comfort care.   5. Ongoing smoking. Counseled to quit.   6. Essential hypertension. On ACE inhibitor, added low-dose Lopressor for better control.   7. Dyslipidemia. Continue home dose statin.    8. Constipation. Placed on bowel regimen. Give soapsuds enema and a dose of Relistor. Requested disimpaction early morning 05/24/2015.   9. Mild elevation in troponin. Troponin rise in none serious pattern, no chest pain, likely mild demand ischemia from RVR, on Eliquis, on aspirin which will be stopped if okay by vascular surgeon. On beta blocker, requested to stop smoking. Cardiology consulted. In my opinion he is a candidate for medical management only due to his underlying with his above listed comorbidities.   10.  DM type II. Currently on sliding scale continue and monitor CBG control.  CBG (last 3)   Recent Labs  05/23/15 1645 05/23/15 2110 05/24/15 0719  GLUCAP 136* 157* 159*      Code Status : DO NOT RESUSCITATE. Prognosis is poor has been discussed with patient and friend bedside in detail.  Family Communication  :  Friend bedside  Disposition Plan  : Likely home in the next 3-4 days  Consults  :  Vascular surgeon Dr. Doren Custard, Cards, [facility] Care  Procedures  :   By vascular surgeon Dr. Doren Custard on 05/21/2015  1. Bilateral common femoral artery endarterectomies with bovine pericardial patch angioplasties 2. Bilateral femoral embolectomies 3. Left to right femorofemoral bypass graft (8 mm Dacron  graft) 4. Intraoperative arteriogram 2   TTE 03-04-15  - Left ventricle: The cavity size was normal. Wall thickness wasincreased increased in a pattern of mild to moderate LVH. Theestimated ejection fraction was 60%. Wall motion was normal;there were no regional wall motion abnormalities. - Aortic valve: Not sure if two or three cusps. The more anteriorcusp may dome. There is very slight gradient across the valve. Trivial AI. - Right ventricle: The cavity size was normal. Systolic functionwas normal.   DVT Prophylaxis  :  Lovenox    Lab Results  Component Value Date   PLT 257 05/24/2015    Inpatient Medications  Scheduled Meds: . apixaban  5 mg Oral BID  . aspirin  81 mg Oral Daily  . atorvastatin  40 mg Oral q1800  . bisacodyl  10 mg Oral Daily  . docusate sodium  200 mg Oral BID  . feeding supplement (ENSURE ENLIVE)  237 mL Oral BID BM  . folic acid  1 mg Oral Daily  . hydrocortisone cream   Topical TID  . insulin aspart  0-5 Units Subcutaneous QHS  . insulin aspart  0-9 Units Subcutaneous TID WC  . levalbuterol  1.25 mg Nebulization TID  . [START ON 05/25/2015] methylnaltrexone  8 mg Subcutaneous QODAY  . metoprolol  50 mg Oral BID  . nicotine  21 mg Transdermal Daily  . pantoprazole  40 mg Oral QHS  . piperacillin-tazobactam (ZOSYN)  IV  3.375 g Intravenous 3 times per day  . pneumococcal 23 valent vaccine  0.5 mL Intramuscular Once  . polyethylene glycol  17 g Oral BID  . tamsulosin  0.4 mg Oral Daily  . vancomycin  750 mg Intravenous Q12H   Continuous Infusions: . sodium chloride 50 mL/hr at 05/23/15 1900   PRN Meds:.alum & mag hydroxide-simeth, guaiFENesin-dextromethorphan, haloperidol lactate, hydrALAZINE, HYDROmorphone (DILAUDID) injection, ipratropium-albuterol, magnesium sulfate 1 - 4 g bolus IVPB, metoprolol, nitroGLYCERIN, ondansetron **OR** ondansetron (ZOFRAN) IV, phenol, potassium chloride, sodium chloride  Antibiotics  :    Anti-infectives     Start     Dose/Rate Route Frequency Ordered Stop   05/22/15 2200  vancomycin (VANCOCIN) IVPB 750 mg/150 ml premix     750 mg 150 mL/hr over 60 Minutes Intravenous Every 12 hours 05/22/15 0945     05/22/15 1400  piperacillin-tazobactam (ZOSYN) IVPB 3.375 g     3.375 g 12.5 mL/hr over 240 Minutes Intravenous 3 times per day 05/22/15 0945     05/22/15 1030  vancomycin (VANCOCIN) IVPB 1000 mg/200 mL premix     1,000 mg 200 mL/hr over 60 Minutes Intravenous  Once 05/22/15 0945 05/22/15 1212   05/21/15 2200  azithromycin (ZITHROMAX) 500 mg in dextrose 5 % 250 mL IVPB  Status:  Discontinued     500 mg 250 mL/hr over 60 Minutes Intravenous Every 24 hours 05/21/15 1354 05/22/15 0945   05/21/15 2100  cefTRIAXone (ROCEPHIN) 1 g in dextrose 5 % 50 mL IVPB  Status:  Discontinued     1 g 100 mL/hr over 30 Minutes Intravenous Every 24 hours 05/21/15 1354 05/22/15 0945   05/21/15 1330  ceFAZolin (ANCEF) IVPB 2 g/50 mL premix    Comments:  Send with pt to OR   2 g 100 mL/hr over 30 Minutes Intravenous To [facility] 05/21/15 1315 05/21/15 1501   05/21/15 0830  piperacillin-tazobactam (ZOSYN) IVPB 3.375 g     3.375 g 100 mL/hr over 30 Minutes Intravenous  Once 05/21/15 0823 05/21/15 0854   05/21/15 0815  piperacillin-tazobactam (ZOSYN) IVPB 3.375 g  Status:  Discontinued     3.375 g 12.5 mL/hr over 240 Minutes Intravenous  Once 05/21/15 0808 05/21/15 0823        Objective:   Filed Vitals:   05/24/15 0630 05/24/15 0700 05/24/15 0730 05/24/15 0927  BP: 92/51 97/52 130/79 130/79  Pulse: 100 74 124 126  Temp:  97.9 F (36.6 C)    TempSrc:  Oral    Resp: 19 28 34 26  Height:      Weight:      SpO2: 94% 91% 99% 94%    Wt Readings from Last 3 Encounters:  05/24/15 57.9 kg (127 lb 10.3 oz)  05/11/15 61.689 kg (136 lb)  05/02/15 61.236 kg (135 lb)     Intake/Output Summary (Last 24 hours) at 05/24/15 1121 Last data filed at 05/24/15 0800  Gross per 24 hour  Intake   1290 ml  Output     200 ml  Net   1090 ml     Physical Exam  Awake Alert, Oriented X 3, No new F.N deficits, Chr L leg weakness, Anxious affect [place].AT,PERRAL Supple Neck,No JVD, No cervical lymphadenopathy appriciated.  Symmetrical Chest wall movement, Mod air movement bilaterally, Coarse Bilat B sounds Rapid RRR,No Gallops,Rubs or new Murmurs, No Parasternal Heave +ve B.Sounds, Abd Soft, No tenderness, No organomegaly appriciated, No rebound - guarding or rigidity. No Cyanosis, Clubbing or edema, No new Rash or bruise      Data Review:   Micro Results Recent Results (from the past 240 hour(s))  MRSA PCR Screening     Status: None   Collection Time: 05/21/15 12:38 PM  Result Value Ref Range Status   MRSA by PCR NEGATIVE NEGATIVE Final    Comment:        The GeneXpert MRSA Assay (FDA approved for NASAL specimens only), is one component of a comprehensive MRSA colonization surveillance program. It is not intended to diagnose MRSA infection nor to guide or monitor treatment for MRSA infections.   Urine culture     Status: None   Collection Time: 05/21/15  1:35 PM  Result Value Ref Range Status   Specimen Description URINE, RANDOM  Final   Special Requests NONE  Final   Culture NO GROWTH 1 DAY  Final   Report Status 05/22/2015 FINAL  Final  Culture, sputum-assessment     Status: None   Collection Time: 05/22/15  5:23 AM  Result Value Ref Range Status   Specimen Description SPUTUM  Final   Special Requests NONE  Final   Sputum evaluation   Final    MICROSCOPIC FINDINGS SUGGEST THAT THIS SPECIMEN IS NOT REPRESENTATIVE OF LOWER RESPIRATORY SECRETIONS. PLEASE RECOLLECT. Gram Stain Report Called to,Read Back By and Verified With: B. GROGAN,RN AT [id number] ON [id number] BY Rhea Bleacher    Report Status 05/22/2015 FINAL  Final    Radiology Reports Ct Head Wo Contrast  05/22/2015   CLINICAL DATA:  Confusion.  EXAM: CT HEAD WITHOUT CONTRAST  TECHNIQUE: Contiguous axial images were obtained from the  base of the skull through the vertex without  intravenous contrast.  COMPARISON:  MRI brain 05/05/2015  FINDINGS: Mild diffuse cerebral atrophy. Patchy low-attenuation changes in the deep white matter. This is likely due to small vessel ischemia. No mass effect or midline shift. No abnormal extra-axial fluid collections. Gray-white matter junctions are distinct. Basal cisterns are not effaced. No evidence of acute intracranial hemorrhage. No depressed skull fractures. Visualized paranasal sinuses and mastoid air cells are not opacified. Vascular calcifications.  IMPRESSION: No acute intracranial abnormalities. Mild chronic atrophy and small vessel ischemic changes.   Electronically Signed   By: Lucienne Capers M.D.   On: 05/22/2015 18:27   Ct Angio Chest Pe W/cm &/or Wo Cm  05/23/2015   CLINICAL DATA:  Shortness of breath. Lung cancer with metastasis. Hypercoagulability.  EXAM: CT ANGIOGRAPHY CHEST WITH CONTRAST  TECHNIQUE: Multidetector CT imaging of the chest was performed using the standard protocol during bolus administration of intravenous contrast. Multiplanar CT image reconstructions and MIPs were obtained to evaluate the vascular anatomy.  CONTRAST:  35m OMNIPAQUE IOHEXOL 350 MG/ML SOLN  COMPARISON:  03/23/2015  FINDINGS: Technically adequate study with good opacification of the central and segmental pulmonary arteries. No focal filling defects demonstrated. No evidence of significant pulmonary embolus. There is right hilar lymphadenopathy which causes focal constriction of the right main pulmonary artery but the vessel remains patent despite focal stenosis.  Normal heart size. Normal caliber thoracic aorta. Aortic calcification. No aortic dissection. Coronary artery calcification. Esophagus is decompressed. Pretracheal lymphadenopathy measures 14 mm short axis dimension. Right hilar lymphadenopathy measures up to 17 mm diameter.  Right upper lung mass is again demonstrated in appears larger than the  prior study, although additional disease is present in the right lung which makes measurement difficult. The mass appears to measure about 3.7 x 4.1 cm today. Focal cavitation in the lesion. Lesion abuts the pleural surface and there is associated pleural thickening. Pleural invasion is not excluded.  Since the previous study, there is interval development of a large right pleural effusion and a small left pleural effusion. Interval development of diffuse patchy interstitial nodular changes throughout both lungs, probably representing lymphangitic tumor spread. Diffuse bronchial wall thickening. Diffuse bronchitis with with diffuse patchy bronchopneumonia not excluded. No pneumothorax.  Included portions of the upper abdominal organs demonstrate increased nodular soft tissue density in the left suprarenal space, incompletely included within the study. This measures about 3.6 cm maximal diameter. With the normal adrenal gland is not demonstrated in this likely represents a new adrenal gland metastasis. However, due to the field of view, the it is also possible this is just the top of the kidney. Visualized portion of the right adrenal gland is unremarkable in the other upper abdominal organs visualize are grossly unremarkable. Degenerative changes in the spine. Mild compression of a mid thoracic vertebra, new since prior study. This may indicate developing bone metastasis.  Review of the MIP images confirms the above findings.  IMPRESSION: No evidence of significant pulmonary embolus although there is focal nonocclusive stenosis of the right main pulmonary artery caused by right hilar lymphadenopathy. Since prior study, there is enlargement of the right apical mass lesion with small focal cavitation. Pleural thickening may indicate pleural involvement. New large right pleural effusion and small left pleural effusion. Diffuse nodular interstitial pattern throughout both lungs with bronchial wall thickening. This is  likely to represent lymphangitic tumor metastases but diffuse bronchiolitis and bronchopneumonia is not excluded. Possible developing left adrenal gland metastasis although incompletely included on the study. New central endplate compression of  a mid thoracic vertebra a may indicate bone metastasis.   Electronically Signed   By: Lucienne Capers M.D.   On: 05/23/2015 22:15   Ct Lumbar Spine Wo Contrast  05/18/2015   CLINICAL DATA:  Low back pain.  Constipation.  EXAM: CT LUMBAR SPINE WITHOUT CONTRAST  TECHNIQUE: Multidetector CT imaging of the lumbar spine was performed without intravenous contrast administration. Multiplanar CT image reconstructions were also generated.  COMPARISON:  PET-CT 04/07/2015  FINDINGS: On scout imaging there is constipation with stool distending the entire colon.  Lumbar spine:  No acute fracture, endplate erosion, or traumatic malalignment. Bone metastases are not appreciated, including a large deposit in the L2 body seen on previous PET-CT.  Right L5 pars defect without slip.  Mild disc bulging at L4-5 and L5-S1 without suspected impingement. Mild moderate left facet arthropathy at L4-5 and L5-S1. No gross canal hematoma.  Small right pleural effusion. Right adrenal thickening, likely metastatic deposit based on previous PET-CT. Abdominal aortic aneurysm with a 34 mm maximal diameter. Attenuated appearance of the right common and external iliac arteries consistent with chronic occlusion.  Pelvis: Status post open reduction internal fixation of the proximal right femur. There is no evidence of periprosthetic fracture or hardware loosening. No acute fracture or diastasis. Metastases within the right pelvis on prior PET-CT are not visible by CT.  Mild osteoarthritic spurring about the sacroiliac joints. No notable hip degenerative change.  Sigmoid diverticulosis.  IMPRESSION: 1. No acute finding in the lumbar spine or pelvis. 2. Osseous metastases seen on PET-CT 04/07/2015 are not  visible by CT. 3. Large stool volume. 4. Right pleural effusion is small but increased from July 2016.   Electronically Signed   By: Monte Fantasia M.D.   On: 05/18/2015 02:52   Ct Pelvis Wo Contrast  05/18/2015   CLINICAL DATA:  Low back pain.  Constipation.  EXAM: CT LUMBAR SPINE WITHOUT CONTRAST  TECHNIQUE: Multidetector CT imaging of the lumbar spine was performed without intravenous contrast administration. Multiplanar CT image reconstructions were also generated.  COMPARISON:  PET-CT 04/07/2015  FINDINGS: On scout imaging there is constipation with stool distending the entire colon.  Lumbar spine:  No acute fracture, endplate erosion, or traumatic malalignment. Bone metastases are not appreciated, including a large deposit in the L2 body seen on previous PET-CT.  Right L5 pars defect without slip.  Mild disc bulging at L4-5 and L5-S1 without suspected impingement. Mild moderate left facet arthropathy at L4-5 and L5-S1. No gross canal hematoma.  Small right pleural effusion. Right adrenal thickening, likely metastatic deposit based on previous PET-CT. Abdominal aortic aneurysm with a 34 mm maximal diameter. Attenuated appearance of the right common and external iliac arteries consistent with chronic occlusion.  Pelvis: Status post open reduction internal fixation of the proximal right femur. There is no evidence of periprosthetic fracture or hardware loosening. No acute fracture or diastasis. Metastases within the right pelvis on prior PET-CT are not visible by CT.  Mild osteoarthritic spurring about the sacroiliac joints. No notable hip degenerative change.  Sigmoid diverticulosis.  IMPRESSION: 1. No acute finding in the lumbar spine or pelvis. 2. Osseous metastases seen on PET-CT 04/07/2015 are not visible by CT. 3. Large stool volume. 4. Right pleural effusion is small but increased from July 2016.   Electronically Signed   By: Monte Fantasia M.D.   On: 05/18/2015 02:52   Mr Jeri Cos LX  Contrast  05/05/2015   CLINICAL DATA:  64 year old male with stage IV non-small  cell lung cancer diagnosed in June. Staging. Subsequent encounter.  EXAM: MRI HEAD WITHOUT AND WITH CONTRAST  TECHNIQUE: Multiplanar, multiecho pulse sequences of the brain and surrounding structures were obtained without and with intravenous contrast.  CONTRAST:  65m MULTIHANCE GADOBENATE DIMEGLUMINE 529 MG/ML IV SOLN  COMPARISON:  PET-CT 04/07/2015.  FINDINGS: There is a small triangular focus of T2 and FLAIR hyperintensity in the right cerebellum (series 6, image 7) associated with curvilinear postcontrast enhancement (series 11, image 12). However, this more resembles a small area of encephalomalacia than a mass. No associated hemosiderin. Diffusion here is facilitated.  No other abnormal enhancement identified.  No dural thickening.  Patchy T2 hyperintensity in the pons. No supratentorial encephalomalacia, with normal for age hemispheric gray and white matter signal.  No restricted diffusion to suggest acute infarction. No intracranial mass effect, ventriculomegaly, extra-axial collection or acute intracranial hemorrhage. Cervicomedullary junction and pituitary are within normal limits. Grossly negative visualized cervical spine.  Visualized bone marrow signal is within normal limits. Negative scalp soft tissues. Orbits soft tissues are normal aside from postoperative changes to the globes. Visible internal auditory structures appear normal. Visualized paranasal sinuses and mastoids are clear.  IMPRESSION: 1. A small 7 mm focus of signal abnormality and enhancement in the right cerebellar hemisphere more resembles sequelae of small vessel ischemia or a small benign vascular malformation than a small metastasis. Recommend repeat study to document stability in 3 months, unless clinically indicated sooner. 2. Otherwise no acute or metastatic intracranial process.   Electronically Signed   By: HGenevie AnnM.D.   On: 05/05/2015 14:06   Dg  Ang/ext/uni/or Right  05/22/2015   CLINICAL DATA:  64year old male with a history of bilateral femoral embolectomy with patch angioplasty.  EXAM: LEFT ANG/EXT/UNI/ OR  CONTRAST:  Op note  FLUOROSCOPY TIME:  Op note  COMPARISON:  No comparison available  FINDINGS: Intraoperative spot images during bilateral femoral embolectomy and patch angioplasty.  Left:  Single image during lower extremity angiogram demonstrates partial opacification of the leg vasculature. Surgical clips present along the medial knee.  Right:  Single image during lower extremity angiogram demonstrates partial opacification of the leg vasculature.  IMPRESSION: Intraoperative angiogram of the left and right lower extremity, with incomplete opacification of the leg vasculature bilaterally.  Please refer to the dictated operative report for full details of intraoperative findings and procedure.  Signed,  JDulcy Fanny WEarleen Newport DO  [facility]  [facility]   Electronically Signed   By: JCorrie MckusickD.O.   On: 05/22/2015 08:06   Dg Chest Port 1 View  05/23/2015   CLINICAL DATA:  Lung cancer, hypertension, shortness of breath  EXAM: PORTABLE CHEST - 1 VIEW  COMPARISON:  05/22/2015  FINDINGS: Bilateral interstitial and alveolar airspace opacities. Small loculated right pleural effusion. No pneumothorax. Stable cardiomediastinal silhouette. No acute osseous abnormality.  IMPRESSION: Bilateral interstitial and alveolar airspace opacities as can be seen with multi lobar pneumonia. Small partial loculated right pleural effusion slightly larger compared with 05/22/2015.   Electronically Signed   By: HKathreen Devoid  On: 05/23/2015 11:27   Dg Chest Port 1 View  05/21/2015   CLINICAL DATA:  Hypoxia, history of stage IV lung cancer with metastatic disease  EXAM: PORTABLE CHEST - 1 VIEW  COMPARISON:  03/23/2015, 04/07/2015  FINDINGS: Cardiac shadow is stable. Persistent right apical mass lesion is noted consistent  with the patient's given clinical history. Patchy infiltrate is noted in the right lung base increased  from the prior exam. Additionally diffuse interstitial changes are noted which may represent lymphangitic spread of carcinoma. No sizable effusion is seen. No acute bony abnormality is noted.  IMPRESSION: Stable right apical mass.  Patchy right basilar infiltrate which may represent acute pneumonia.  Diffuse interstitial changes likely related to the underlying neoplasm.   Electronically Signed   By: Inez Catalina M.D.   On: 05/21/2015 08:00   Dg Abd Acute W/chest  05/22/2015   CLINICAL DATA:  Constipation.  Shortness of breath.  Abdominal pain.  EXAM: DG ABDOMEN ACUTE W/ 1V CHEST  COMPARISON:  None.  FINDINGS: There is a moderate to large amount of fecal matter throughout the colon. Small bowel gas pattern is normal. No visible free air. No worrisome calcifications or acute bony findings. Calcification of the aorta is noted. Previous ORIF of right hip fracture.  One-view chest again shows widespread patchy density and a right apical mass. Patchy pulmonary infiltrate is worsened in general, particularly at the right lung base.  IMPRESSION: Large amount of fecal matter in the colon consistent with the clinical diagnosis of constipation. No evidence of small bowel obstruction or perforation.  Widespread patchy pulmonary densities consistent with pneumonia, somewhat progressive in the right lower lobe.   Electronically Signed   By: Nelson Chimes M.D.   On: 05/22/2015 13:46   Dg Ang/ext/uni/or Left  05/22/2015   CLINICAL DATA:  64 year old male with a history of bilateral femoral embolectomy with patch angioplasty.  EXAM: LEFT ANG/EXT/UNI/ OR  CONTRAST:  Op note  FLUOROSCOPY TIME:  Op note  COMPARISON:  No comparison available  FINDINGS: Intraoperative spot images during bilateral femoral embolectomy and patch angioplasty.  Left:  Single image during lower extremity angiogram demonstrates partial opacification of  the leg vasculature. Surgical clips present along the medial knee.  Right:  Single image during lower extremity angiogram demonstrates partial opacification of the leg vasculature.  IMPRESSION: Intraoperative angiogram of the left and right lower extremity, with incomplete opacification of the leg vasculature bilaterally.  Please refer to the dictated operative report for full details of intraoperative findings and procedure.  Signed,  Dulcy Fanny. Earleen Newport, DO  [facility]  [facility]   Electronically Signed   By: Corrie Mckusick D.O.   On: 05/22/2015 08:06     CBC  Recent Labs Lab 05/18/15 0240 05/21/15 0754 05/21/15 0759 05/21/15 2200 05/22/15 0615 05/23/15 0010 05/24/15 0220  WBC 8.6 8.5  --  6.0 6.8 7.4 8.0  HGB 12.9* 13.3 14.6 11.3* 11.3* 11.2* 10.6*  HCT 37.4* 39.1 43.0 33.0* 34.0* 33.4* 31.3*  PLT 312 301  --  226 230 219 257  MCV 86.8 86.9  --  86.2 87.4 88.1 86.7  MCH 29.9 29.6  --  29.5 29.0 29.6 29.4  MCHC 34.5 34.0  --  34.2 33.2 33.5 33.9  RDW 12.8 13.0  --  13.0 13.2 13.3 13.3  LYMPHSABS 0.5* 0.8  --   --   --   --   --   MONOABS 1.0 0.7  --   --   --   --   --   EOSABS 0.1 0.1  --   --   --   --   --   BASOSABS 0.0 0.0  --   --   --   --   --     Chemistries   Recent Labs Lab 05/18/15 0240 05/21/15 0754 05/21/15 0759 05/21/15 2200 05/22/15 0615 05/23/15 0010 05/24/15 0220  NA 128* 128* 127*  --  131* 132* 131*  K 4.8 4.7 4.7  --  4.6 4.3 4.4  CL 94* 94* 97*  --  99* 99* 99*  CO2 22 24  --   --  20* 22 19*  GLUCOSE 301* 242* 243*  --  175* 181* 154*  BUN 40* 30* 29*  --  20 21* 17  CREATININE 1.06 0.79 0.90 0.90 0.93 0.93 0.97  CALCIUM 10.3 9.9  --   --  8.4* 8.8* 8.5*  MG  --   --   --   --   --  1.7  --   AST 24 24  --   --   --  77* 54*  ALT 25 22  --   --   --  32 30  ALKPHOS 110 112  --   --   --  112 166*  BILITOT 0.3 0.3  --   --   --  0.7 0.8    ------------------------------------------------------------------------------------------------------------------ estimated creatinine clearance is 63 mL/min (by C-G formula based on Cr of 0.97). ------------------------------------------------------------------------------------------------------------------  Recent Labs  05/21/15 2200  HGBA1C 8.1*   ------------------------------------------------------------------------------------------------------------------  Recent Labs  05/24/15 0220  CHOL 107  HDL 21*  LDLCALC 62  TRIG 121  CHOLHDL 5.1   ------------------------------------------------------------------------------------------------------------------  Recent Labs  05/22/15 1110  TSH 0.814   ------------------------------------------------------------------------------------------------------------------ No results for input(s): VITAMINB12, FOLATE, FERRITIN, TIBC, IRON, RETICCTPCT in the last 72 hours.  Coagulation profile No results for input(s): INR, PROTIME in the last 168 hours.  No results for input(s): DDIMER in the last 72 hours.  Cardiac Enzymes  Recent Labs Lab 05/22/15 1110 05/22/15 1830 05/23/15 0010  TROPONINI 0.49* 0.50* 0.27*   ------------------------------------------------------------------------------------------------------------------ Invalid input(s): POCBNP   Time Spent in minutes   35   Kadyn Chovan K M.D on 05/24/2015 at 11:21 AM  Between 7am to 7pm - Pager - [phone]  After 7pm go to [email or web address] - password [facility]  [facility] -  Office  [phone]

## 2015-05-24 NOTE — Progress Notes (Signed)
   05/24/15 1000  Clinical Encounter Type  Visited With Patient;Other (Comment)  Visit Type Follow-up  Advance Directives (For Healthcare)  Does patient have an advance directive? No  Would patient like information on creating an advanced directive? Yes - Scientist, clinical (histocompatibility and immunogenetics) given   Chaplain spoke to CSW at the cancer center who had originally helped patient begin talking about getting an advanced directive completed. Chaplain and CSW were able to get on the same page on what we both talked to the patient about. Chaplain agreed to follow up with the patient today. Chaplain followed up with patient's caregiver today, as the patient was being visited by other medical staff. Patient's caregiver seemed really anxious about the patient's advanced directive getting done. None of it has been completed yet but chaplain let patient's caregiver know that if they completed it today a chaplain would be available to help them sign and finalize the documents and get them filed.  Gar Ponto, Chaplain  10:32 AM

## 2015-05-24 NOTE — Progress Notes (Signed)
   05/24/15 1600  Clinical Encounter Type  Visited With Patient  Visit Type Spiritual support  Referral From Nurse  Consult/Referral To Chaplain  Spiritual Encounters  Spiritual Needs Emotional  Ch visited pt. To consult on Advanced Directive. Pt requested roommate present; follow-up Wed when roommate present.

## 2015-05-24 NOTE — Progress Notes (Addendum)
[facility]  Subjective  - Constipated and SOB.  He states his left leg and foot feel much better.   Objective 130/79 126 97.9 F (36.6 C) (Oral) 26 94%  Intake/Output Summary (Last 24 hours) at 05/24/15 1051 Last data filed at 05/24/15 0800  Gross per 24 hour  Intake   1290 ml  Output    200 ml  Net   1090 ml   Doppler PT right biphasic, left monophasic/weak signal Incisions soft without hematoma Skin appears "normal" color with increased active range of motion of left foot.   Assessment/Planning: POD # 3  * 2 Day Post-Op s/p:  1. Bilateral common femoral artery endarterectomies with bovine pericardial patch angioplasties 2. Bilateral femoral embolectomies 3. Left to right femorofemoral bypass graft (8 mm Dacron graft) 4. 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  Doing well from a Vascular standpoint.   Deitra Mayo, MD, [place] [phone] Office: [phone]

## 2015-05-25 ENCOUNTER — Encounter: Payer: Self-pay | Admitting: *Deleted

## 2015-05-25 LAB — COMPREHENSIVE METABOLIC PANEL
ALT: 26 U/L (ref 17–63)
ANION GAP: 15 (ref 5–15)
AST: 39 U/L (ref 15–41)
Albumin: 1.9 g/dL — ABNORMAL LOW (ref 3.5–5.0)
Alkaline Phosphatase: 159 U/L — ABNORMAL HIGH (ref 38–126)
BUN: 26 mg/dL — ABNORMAL HIGH (ref 6–20)
CHLORIDE: 99 mmol/L — AB (ref 101–111)
CO2: 17 mmol/L — AB (ref 22–32)
CREATININE: 1.36 mg/dL — AB (ref 0.61–1.24)
Calcium: 8.5 mg/dL — ABNORMAL LOW (ref 8.9–10.3)
GFR, EST NON AFRICAN AMERICAN: 53 mL/min — AB (ref >60)
Glucose, Bld: 195 mg/dL — ABNORMAL HIGH (ref 65–99)
POTASSIUM: 4.3 mmol/L (ref 3.5–5.1)
SODIUM: 131 mmol/L — AB (ref 135–145)
Total Bilirubin: 1.2 mg/dL (ref 0.3–1.2)
Total Protein: 5.4 g/dL — ABNORMAL LOW (ref 6.5–8.1)

## 2015-05-25 LAB — GLUCOSE, CAPILLARY
GLUCOSE-CAPILLARY: 154 mg/dL — AB (ref 65–99)
GLUCOSE-CAPILLARY: 152 mg/dL — AB (ref 65–99)
GLUCOSE-CAPILLARY: 144 mg/dL — AB (ref 65–99)

## 2015-05-25 LAB — CBC
HCT: 29.6 % — ABNORMAL LOW (ref 39.0–52.0)
Hemoglobin: 9.7 g/dL — ABNORMAL LOW (ref 13.0–17.0)
MCH: 28.7 pg (ref 26.0–34.0)
MCHC: 32.8 g/dL (ref 30.0–36.0)
MCV: 87.6 fL (ref 78.0–100.0)
PLATELETS: 234 10*3/uL (ref 150–400)
RBC: 3.38 MIL/uL — AB (ref 4.22–5.81)
RDW: 13.6 % (ref 11.5–15.5)
WBC: 8.2 10*3/uL (ref 4.0–10.5)

## 2015-05-25 MED ORDER — APIXABAN 5 MG PO TABS: 5.0000 mg | ORAL_TABLET | Freq: Two times a day (BID) | ORAL | Status: AC

## 2015-05-25 MED ORDER — DOCUSATE SODIUM 100 MG PO CAPS: 200.0000 mg | ORAL_CAPSULE | Freq: Two times a day (BID) | ORAL | Status: AC

## 2015-05-25 MED ORDER — HALOPERIDOL 2 MG PO TABS: 2.0000 mg | ORAL_TABLET | Freq: Four times a day (QID) | ORAL | Status: AC | PRN

## 2015-05-25 MED ORDER — MORPHINE SULFATE (CONCENTRATE) 10 MG/0.5ML PO SOLN: 20.0000 mg | ORAL | Status: DC | PRN

## 2015-05-25 MED ORDER — POLYETHYLENE GLYCOL 3350 17 G PO PACK: 17.0000 g | PACK | Freq: Two times a day (BID) | ORAL | Status: AC | PRN

## 2015-05-25 MED ORDER — ONDANSETRON HCL 4 MG PO TABS: 4.0000 mg | ORAL_TABLET | Freq: Three times a day (TID) | ORAL | Status: AC | PRN

## 2015-05-25 MED ORDER — AMIODARONE HCL 200 MG PO TABS
400.0000 mg | ORAL_TABLET | Freq: Two times a day (BID) | ORAL | Status: DC
  Administered 2015-05-25 – 2015-05-26 (×2): 400 mg via ORAL
  Filled 2015-05-25 (×4): qty 2

## 2015-05-25 MED ORDER — HYDROMORPHONE HCL 1 MG/ML IJ SOLN
0.5000 mg | INTRAMUSCULAR | Status: DC | PRN
  Administered 2015-05-25 – 2015-05-26 (×3): 1 mg via INTRAVENOUS
  Filled 2015-05-25 (×3): qty 1

## 2015-05-25 MED ORDER — LEVALBUTEROL HCL 1.25 MG/0.5ML IN NEBU
1.2500 mg | INHALATION_SOLUTION | Freq: Four times a day (QID) | RESPIRATORY_TRACT | Status: DC
  Administered 2015-05-25 – 2015-05-26 (×4): 1.25 mg via RESPIRATORY_TRACT
  Filled 2015-05-25 (×9): qty 0.5

## 2015-05-25 MED ORDER — MORPHINE SULFATE (CONCENTRATE) 10 MG/0.5ML PO SOLN
10.0000 mg | ORAL | Status: DC | PRN
  Administered 2015-05-25: 20 mg via ORAL
  Filled 2015-05-25: qty 1

## 2015-05-25 MED ORDER — METOPROLOL TARTRATE 1 MG/ML IV SOLN
2.5000 mg | INTRAVENOUS | Status: DC | PRN
  Administered 2015-05-25: 5 mg via INTRAVENOUS
  Filled 2015-05-25: qty 5

## 2015-05-25 MED ORDER — AMIODARONE HCL 400 MG PO TABS: 400.0000 mg | ORAL_TABLET | Freq: Two times a day (BID) | ORAL | Status: AC

## 2015-05-25 MED ORDER — LORATADINE 10 MG PO TABS
10.0000 mg | ORAL_TABLET | Freq: Every day | ORAL | Status: DC
  Filled 2015-05-25 (×2): qty 1

## 2015-05-25 MED ORDER — METOPROLOL TARTRATE 1 MG/ML IV SOLN
2.5000 mg | Freq: Once | INTRAVENOUS | Status: AC
  Administered 2015-05-25: 2.5 mg via INTRAVENOUS

## 2015-05-25 MED ORDER — MORPHINE SULFATE (CONCENTRATE) 10 MG/0.5ML PO SOLN: 10.0000 mg | ORAL | Status: AC | PRN

## 2015-05-25 NOTE — Progress Notes (Signed)
Notified by Whitman Hero, CMRN of pt./family request for [facility] services at home after discharge.Insurance underwriter spoke with patients' friend and PCG, Rex at the bedside to initiate education related to hospice philosophy, services and team approach to care. Patient was sleeping while we talked. Rex voiced understanding of information provided. Per discussion plan is for discharge to home by PTAR. Today,  Please send signed and completed DNR form home with pt. Pt. Will need prescriptions for discharge comfort medications.  DME needs discussed and PCG is requesting an electric bed with 1/2 rails, bsc,  O2 at 6l n/c, suction, nebulizer, walker with wheels and a table for delivery to the home today.[facility] equipment manager Jewel Ysidro Evert notified and will contact [place] to arrange delivery to the home. The home address has been verified and is correct in the chart.Rex is the contact person to be called to arrange time of delivery.  [facility] aware of above. Please notify [facility] when pt. Is ready to leave unit at discharge- call [phone] ( or [phone] after 5 p). [facility] information and contact numbers have been given to [place] during visit. Above information shared with Levada Dy, [facility]. Please call with any questions.  [facility] Liaison [phone]

## 2015-05-25 NOTE — Progress Notes (Signed)
Oncology Nurse Navigator Documentation  Oncology Nurse Navigator Flowsheets 05/25/2015  Navigator Encounter Type Telephone/Care giver, Rex called and left me a vm message to call.  I called him back.  He stated patient will be going home with Hospice.  I listened as he explained.  I updated Dr. Julien Nordmann and he asked that all appt to be cancelled.  I then notified scheduling to d/c appt  Treatment Phase -  Interventions Other  Time Spent with Patient 30

## 2015-05-25 NOTE — Progress Notes (Signed)
   VASCULAR SURGERY ASSESSMENT & PLAN:  * 4 Days Post-Op s/p:     1. Bilateral common femoral artery endarterectomies with bovine pericardial patch angioplasties 2. Bilateral femoral embolectomies 3. Left to right femorofemoral bypass graft (8 mm Dacron graft) 4. Intraoperative arteriogram 2  *  Doing well from a vascular standpoint  * Palliative Medicine note appreciated (plan is home with Hospice)  SUBJECTIVE: No complaints this AM  PHYSICAL EXAM: Filed Vitals:   05/25/15 0300 05/25/15 0415 05/25/15 0422 05/25/15 0500  BP: 107/51 141/65  99/43  Pulse: 108 126  112  Temp:  97.1 F (36.2 C)    TempSrc:  Axillary    Resp: '23 29  30  '$ Height:      Weight:   124 lb 1.9 oz (56.3 kg)   SpO2: 95% 93%  99%   Both incisions look fine Palpable fem-fem graft pulse Both feet well perfused.   LABS: Lab Results  Component Value Date   WBC 8.2 05/25/2015   HGB 9.7* 05/25/2015   HCT 29.6* 05/25/2015   MCV 87.6 05/25/2015   PLT 234 05/25/2015   Lab Results  Component Value Date   CREATININE 1.36* 05/25/2015   Lab Results  Component Value Date   INR 0.96 03/23/2015   CBG (last 3)   Recent Labs  05/24/15 1715 05/24/15 1743 05/24/15 2116  GLUCAP 128* 139* 113*    Principal Problem:   Arterial occlusion Active Problems:   Hyperlipidemia   Essential hypertension   WOLFF (WOLFE)-PARKINSON-WHITE (WPW) SYNDROME   CAD (coronary artery disease)   Tobacco abuse   Atrial fibrillation with rapid ventricular response   Lung cancer   Cold extremities   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

## 2015-05-25 NOTE — Care Management Important Message (Signed)
Important Message  Patient Details  Name: Mark Hurst MRN: [id number] Date of Birth: 07/28/1951   Medicare Important Message Given:  [facility] notification given    Nathen May 05/25/2015, 11:41 AMImportant Message  Patient Details  Name: Mark Hurst MRN: [id number] Date of Birth: May 14, 1951   Medicare Important Message Given:  Yes-second notification given    Nathen May 05/25/2015, 11:41 AM

## 2015-05-25 NOTE — Discharge Summary (Addendum)
Mark Hurst, is a 64 y.o. male  DOB [birth year]  MRN [id number].  Admission date:  05/21/2015  Admitting Physician  Theodis Blaze, MD  Discharge Date:  05/25/2015   Primary MD  [name], MD  Recommendations for primary care physician for things to follow:   Being discharged with home hospice goal of care is comfort   Admission Diagnosis  Pulseless disease [M31.4] Leg pain [M79.606] Arterial occlusion [I74.9] CAP (community acquired pneumonia) [J18.9]   Discharge Diagnosis  Pulseless disease [M31.4] Leg pain [M79.606] Arterial occlusion [I74.9] CAP (community acquired pneumonia) [J18.9]    Principal Problem:   Arterial occlusion Active Problems:   Hyperlipidemia   Essential hypertension   WOLFF (WOLFE)-PARKINSON-WHITE (WPW) SYNDROME   CAD (coronary artery disease)   Tobacco abuse   Atrial fibrillation with rapid ventricular response   Lung cancer   Cold extremities   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 WPW (Wolff-Parkinson-White syndrome)     ablated  . DM (dermatomyositis)     x 10 years  . Hyperlipidemia     x 13 years  . Tobacco abuse   . CAD (coronary artery disease)     1997 LAD 95% stenosis. He had Rotablator of small  couple lesions in this artery. His last stress perfusion study was in 03/2015 with no evidence of ischemia.  . Iliac artery occlusion, right   . Hypertension     x 13 years  . Anginal pain   . Type II diabetes mellitus     Type 2  . Lung cancer     stage 4 - with mets  . Hepatitis A 1972    "in [employer]"  . Shortness of breath dyspnea   . Atrial fibrillation     Past Surgical  History  Procedure Laterality Date  . Small intestine surgery  ~ 1976    following ingestion of a fish bone  . Fracture surgery    . Colon surgery    . Hip fracture surgery Right 1976?    "have steel plate put in"  . Percutaneous coronary rotoblator intervention (pci-r)  ~ 1998  . Coronary angioplasty  ~ 1998  . Cataract extraction w/ intraocular lens  implant, bilateral Bilateral ~ 2002  . Radiofrequency ablation      for WPW  . Video bronchoscopy with endobronchial navigation N/A 03/23/2015    Procedure: VIDEO BRONCHOSCOPY WITH ENDOBRONCHIAL NAVIGATION;  Surgeon: Collene Gobble, MD;  Location: [facility] OR;  Service: Thoracic;  Laterality: N/A;  . Multiple extractions with alveoloplasty N/A 05/11/2015    Procedure: Extraction of tooth #'s 2,5,6,7,8,11,12,17, 20,21,22,23,24,25,26,27,29 with alveoloplasty;  Surgeon: Lenn Cal, DDS;  Location: [facility] ORS;  Service: Oral Surgery;  Laterality: N/A;  . Genital modification N/A   . Embolectomy Bilateral 05/21/2015    Procedure:  Bilateral Femoral Endarterectomies with Bovine Patch Angioplasties; Bilateral Femoral Thrombectomies; Left to Right Femoral to Femoral Bypass Graft; Intraop Arteriograms times two;  Surgeon: Angelia Mould, MD;  Location: [facility] OR;  Service: Vascular;  Laterality: Bilateral;       HPI  from the history and physical done on the day of admission:     Pt is 64 yo male with HTN, HLD, DM, long history of smoking 2 ppd, recently diagnosed NSLC metastatic to the spine, mediastinum, lymph nodes, history of A-fib (has been on Eliquis but was on hold per pt due to requiring procedures but pt not sure which ones)) follows with Dr. Julien Nordmann, no presented to [facility] ED with main concern of one week duration of progressively worsening bilateral LE pain, LLE > RLE, constant and sharp/10/10 in severity, non radiating, worse with ambulation and even minimal movement, no specific alleviating factors, associated with cold extremities and numbness,  malaise. Pt denies fevers, chills, no specific abdominal or urinary concerns. Pt denies chest pain but endorses exertional dyspnea with chronic non productive cough.   In ED, pt noted to be in mild distress due to dyspnea and pain. Physical exam notable for absence of pulses in LE, arterial doppler confirming findings and vascular surgery team consulted for further evaluation. Request made to transfer pt to [facility] SDU.    Hospital Course:     1. Bilateral femoral artery embolization with ischemia - he is post by lateral femoral embolectomy by Dr. Doren Custard on 05/22/2015, has chronic left leg weakness for which PT and OT will be consulted. Continue supportive care. Likely embolization due to underlying history of paroxysmal atrial fibrillation along with lung cancer with hypercoagulable state. Placed on Eliquis. However now goal of care is comfort due to underlying metastatic lung disease, being discharged with home hospice. As and when he declines further liquids can be stopped.   2. Paroxysmal atrial fibrillation with RVR. [place] Vasc 2 score now least greater than 4. Embolization to bilateral femoral artery, was on Eliquis what was held for the last 2-3 weeks for a dental procedure which has been long done, has been placed on Lopressor along with amiodarone by cardiology , recent echocardiogram noted with preserved EF of 60%. Eliquis has been resumed , once clinically declines further Eliquis can be stopped.    3. 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 He was to undergo palliative chemotherapy in the future. Discussed his prognosis with his primary oncologist Dr. Julien Nordmann agrees with transitioning to full hospice and comfort care. Palliative care has been consulted. His prognosis appears very bleak. Will be discharged with home hospice.   4. Acute hypoxic respiratory failure due to Sepsis due Aspiration HCAP. Placed on  appropriate anti-biotics. Monitor cultures, supportive care with oxygen and nebulizer treatments as needed. Clinical possible mild fluid overload will give a trial of IV Lasix as well. Not showing signs of improvement and overall continues to decline, Discussed his prognosis with his primary oncologist Dr. Julien Nordmann agrees with transitioning to full hospice and comfort care. He will be now discharged with home hospice [facility] will be comfort. Was seen by palliative care in the hospital.   5. Ongoing smoking. Counseled to quit.   6. Essential hypertension. His charge on Lopressor which will aid with rate control as well.   7. Dyslipidemia. Comfort Care only hold statin at discharge.    8. Constipation. Placed on bowel regimen. He received manual disimpaction along  with enema with good results, will be discharged on bowel regimen.   9. Mild elevation in troponin. Troponin rise in none serious pattern, no chest pain, likely mild demand ischemia from RVR, on Eliquis, on aspirin which will be stopped if okay by vascular surgeon. On beta blocker, requested to stop smoking. Cardiology consulted. Continue gentle medical treatment goal of care now is comfort.   10. DM type II. Continue home regimen as tolerated.       Discharge Condition: guarded  Follow UP  Follow-up Information    Follow up with Guhu,Bhuvana, MD In 1 week.   Specialty:  Internal Medicine   Why:  The V.A. office will call you and let you know the date and time of your appointment.   Contact information:   [place] [id number] [phone]        Consults obtained - vascular surgery, cardiology, palliative care  Diet and Activity recommendation: See Discharge Instructions below  Discharge Instructions           Discharge Instructions    Discharge instructions    Complete by:  As directed   Follow with Primary MD [name], MD in 7 days   Get CBC, CMP, 2 view Chest X ray checked  by  Primary MD next visit.    Activity: As tolerated with Full fall precautions use walker/cane & assistance as needed   Disposition Home with Hospice   Diet: Soft Diet with feeding assistance and aspiration precautions.  For Heart failure patients - Check your Weight same time everyday, if you gain over 2 pounds, or you develop in leg swelling, experience more shortness of breath or chest pain, call your Primary MD immediately. Follow Cardiac Low Salt Diet and 1.5 lit/day fluid restriction.   On your next visit with your primary care physician please Get Medicines reviewed and adjusted.   Please request your Prim.MD to go over all Hospital Tests and Procedure/Radiological results at the follow up, please get all Hospital records sent to your Prim MD by signing hospital release before you go home.   If you experience worsening of your admission symptoms, develop shortness of breath, life threatening emergency, suicidal or homicidal thoughts you must seek medical attention immediately by calling 911 or calling your MD immediately  if symptoms less severe.  You Must read complete instructions/literature along with all the possible adverse reactions/side effects for all the Medicines you take and that have been prescribed to you. Take any new Medicines after you have completely understood and accpet all the possible adverse reactions/side effects.   Do not drive, operating heavy machinery, perform activities at heights, swimming or participation in water activities or provide baby sitting services if your were admitted for syncope or siezures until you have seen by Primary MD or a Neurologist and advised to do so again.  Do not drive when taking Pain medications.    Do not take more than prescribed Pain, Sleep and Anxiety Medications  Special Instructions: If you have smoked or chewed Tobacco  in the last 2 yrs please stop smoking, stop any regular Alcohol  and or any Recreational drug  use.  Wear Seat belts while driving.   Please note  You were cared for by a hospitalist during your hospital stay. If you have any questions about your discharge medications or the care you received while you were in the hospital after you are discharged, you can call the unit and asked to speak with the  hospitalist on call if the hospitalist that took care of you is not available. Once you are discharged, your primary care physician will handle any further medical issues. Please note that NO REFILLS for any discharge medications will be authorized once you are discharged, as it is imperative that you return to your primary care physician (or establish a relationship with a primary care physician if you do not have one) for your aftercare needs so that they can reassess your need for medications and monitor your lab values.     Increase activity slowly    Complete by:  As directed              Discharge Medications       Medication List    STOP taking these medications        aspirin 81 MG tablet     atorvastatin 80 MG tablet  Commonly known as:  LIPITOR     HYDROcodone-acetaminophen 5-325 MG per tablet  Commonly known as:  NORCO     lisinopril 40 MG tablet  Commonly known as:  PRINIVIL,ZESTRIL     metoprolol 50 MG tablet  Commonly known as:  LOPRESSOR     traMADol 50 MG tablet  Commonly known as:  ULTRAM      TAKE these medications        amiodarone 400 MG tablet  Commonly known as:  PACERONE  Take 1 tablet (400 mg total) by mouth 2 (two) times daily.     apixaban 5 MG Tabs tablet  Commonly known as:  ELIQUIS  Take 1 tablet (5 mg total) by mouth 2 (two) times daily.     Cholecalciferol 2000 UNITS Tabs  Take 2,000 Units by mouth daily.     dexamethasone 4 MG tablet  Commonly known as:  DECADRON  Take 1 tablet by mouth twice a day, the day before, the day of and day after chemotheapy     docusate sodium 100 MG capsule  Commonly known as:  COLACE  Take 2  capsules (200 mg total) by mouth 2 (two) times daily.     ENSURE  Take 237 mLs by mouth 3 (three) times daily.     fentaNYL 25 MCG/HR patch  Commonly known as:  DURAGESIC - dosed mcg/hr  Place 1 patch (25 mcg total) onto the skin every 3 (three) days.     Fish Oil Oil  Take 1,000 mg by mouth 2 (two) times daily.     folic acid 1 MG tablet  Commonly known as:  FOLVITE  Take 1 tablet (1 mg total) by mouth daily.     haloperidol 2 MG tablet  Commonly known as:  HALDOL  Take 1 tablet (2 mg total) by mouth every 6 (six) hours as needed for agitation.     insulin regular 100 units/mL injection  Commonly known as:  NOVOLIN R,HUMULIN R  Inject 20 Units into the skin every morning. Due to not eating patient not taking insulin     loratadine 10 MG tablet  Commonly known as:  CLARITIN  Take 10 mg by mouth daily as needed for allergies.     metFORMIN 500 MG tablet  Commonly known as:  GLUCOPHAGE  Take 500 mg by mouth 2 (two) times daily with a meal. Not taking due to not eating     morphine CONCENTRATE 10 MG/0.5ML Soln concentrated solution  Take 0.5 mLs (10 mg total) by mouth every 3 (three) hours as needed for moderate pain or  severe pain.     multivitamin tablet  Take 1 tablet by mouth daily.     ondansetron 8 MG tablet  Commonly known as:  ZOFRAN  Take 8 mg by mouth every 8 (eight) hours as needed for nausea or vomiting.     ondansetron 4 MG tablet  Commonly known as:  ZOFRAN  Take 1 tablet (4 mg total) by mouth every 8 (eight) hours as needed for nausea or vomiting.     polyethylene glycol packet  Commonly known as:  MIRALAX / GLYCOLAX  Take 17 g by mouth 2 (two) times daily as needed for severe constipation.     tamsulosin 0.4 MG Caps capsule  Commonly known as:  FLOMAX  Take 0.4 mg by mouth daily.        Major procedures and Radiology Reports - PLEASE review detailed and final reports for all details, in brief -   By vascular surgeon Dr. Doren Custard on 05/21/2015  1.  Bilateral common femoral artery endarterectomies with bovine pericardial patch angioplasties 2. Bilateral femoral embolectomies 3. Left to right femorofemoral bypass graft (8 mm Dacron graft) 4. Intraoperative arteriogram 2   TTE 03-04-15  - Left ventricle: The cavity size was normal. Wall thickness wasincreased increased in a pattern of mild to moderate LVH. Theestimated ejection fraction was 60%. Wall motion was normal;there were no regional wall motion abnormalities. - Aortic valve: Not sure if two or three cusps. The more anteriorcusp may dome. There is very slight gradient across the valve. Trivial AI. - Right ventricle: The cavity size was normal. Systolic functionwas normal.   Ct Head Wo Contrast  05/22/2015   CLINICAL DATA:  Confusion.  EXAM: CT HEAD WITHOUT CONTRAST  TECHNIQUE: Contiguous axial images were obtained from the base of the skull through the vertex without intravenous contrast.  COMPARISON:  MRI brain 05/05/2015  FINDINGS: Mild diffuse cerebral atrophy. Patchy low-attenuation changes in the deep white matter. This is likely due to small vessel ischemia. No mass effect or midline shift. No abnormal extra-axial fluid collections. Gray-white matter junctions are distinct. Basal cisterns are not effaced. No evidence of acute intracranial hemorrhage. No depressed skull fractures. Visualized paranasal sinuses and mastoid air cells are not opacified. Vascular calcifications.  IMPRESSION: No acute intracranial abnormalities. Mild chronic atrophy and small vessel ischemic changes.   Electronically Signed   By: Lucienne Capers M.D.   On: 05/22/2015 18:27   Ct Angio Chest Pe W/cm &/or Wo Cm  05/23/2015   CLINICAL DATA:  Shortness of breath. Lung cancer with metastasis. Hypercoagulability.  EXAM: CT ANGIOGRAPHY CHEST WITH CONTRAST  TECHNIQUE: Multidetector CT imaging of the chest was performed using the standard protocol during bolus administration of intravenous contrast.  Multiplanar CT image reconstructions and MIPs were obtained to evaluate the vascular anatomy.  CONTRAST:  62m OMNIPAQUE IOHEXOL 350 MG/ML SOLN  COMPARISON:  03/23/2015  FINDINGS: Technically adequate study with good opacification of the central and segmental pulmonary arteries. No focal filling defects demonstrated. No evidence of significant pulmonary embolus. There is right hilar lymphadenopathy which causes focal constriction of the right main pulmonary artery but the vessel remains patent despite focal stenosis.  Normal heart size. Normal caliber thoracic aorta. Aortic calcification. No aortic dissection. Coronary artery calcification. Esophagus is decompressed. Pretracheal lymphadenopathy measures 14 mm short axis dimension. Right hilar lymphadenopathy measures up to 17 mm diameter.  Right upper lung mass is again demonstrated in appears larger than the prior study, although additional disease is present in the right lung  which makes measurement difficult. The mass appears to measure about 3.7 x 4.1 cm today. Focal cavitation in the lesion. Lesion abuts the pleural surface and there is associated pleural thickening. Pleural invasion is not excluded.  Since the previous study, there is interval development of a large right pleural effusion and a small left pleural effusion. Interval development of diffuse patchy interstitial nodular changes throughout both lungs, probably representing lymphangitic tumor spread. Diffuse bronchial wall thickening. Diffuse bronchitis with with diffuse patchy bronchopneumonia not excluded. No pneumothorax.  Included portions of the upper abdominal organs demonstrate increased nodular soft tissue density in the left suprarenal space, incompletely included within the study. This measures about 3.6 cm maximal diameter. With the normal adrenal gland is not demonstrated in this likely represents a new adrenal gland metastasis. However, due to the field of view, the it is also possible  this is just the top of the kidney. Visualized portion of the right adrenal gland is unremarkable in the other upper abdominal organs visualize are grossly unremarkable. Degenerative changes in the spine. Mild compression of a mid thoracic vertebra, new since prior study. This may indicate developing bone metastasis.  Review of the MIP images confirms the above findings.  IMPRESSION: No evidence of significant pulmonary embolus although there is focal nonocclusive stenosis of the right main pulmonary artery caused by right hilar lymphadenopathy. Since prior study, there is enlargement of the right apical mass lesion with small focal cavitation. Pleural thickening may indicate pleural involvement. New large right pleural effusion and small left pleural effusion. Diffuse nodular interstitial pattern throughout both lungs with bronchial wall thickening. This is likely to represent lymphangitic tumor metastases but diffuse bronchiolitis and bronchopneumonia is not excluded. Possible developing left adrenal gland metastasis although incompletely included on the study. New central endplate compression of a mid thoracic vertebra a may indicate bone metastasis.   Electronically Signed   By: Lucienne Capers M.D.   On: 05/23/2015 22:15   Ct Lumbar Spine Wo Contrast  05/18/2015   CLINICAL DATA:  Low back pain.  Constipation.  EXAM: CT LUMBAR SPINE WITHOUT CONTRAST  TECHNIQUE: Multidetector CT imaging of the lumbar spine was performed without intravenous contrast administration. Multiplanar CT image reconstructions were also generated.  COMPARISON:  PET-CT 04/07/2015  FINDINGS: On scout imaging there is constipation with stool distending the entire colon.  Lumbar spine:  No acute fracture, endplate erosion, or traumatic malalignment. Bone metastases are not appreciated, including a large deposit in the L2 body seen on previous PET-CT.  Right L5 pars defect without slip.  Mild disc bulging at L4-5 and L5-S1 without suspected  impingement. Mild moderate left facet arthropathy at L4-5 and L5-S1. No gross canal hematoma.  Small right pleural effusion. Right adrenal thickening, likely metastatic deposit based on previous PET-CT. Abdominal aortic aneurysm with a 34 mm maximal diameter. Attenuated appearance of the right common and external iliac arteries consistent with chronic occlusion.  Pelvis: Status post open reduction internal fixation of the proximal right femur. There is no evidence of periprosthetic fracture or hardware loosening. No acute fracture or diastasis. Metastases within the right pelvis on prior PET-CT are not visible by CT.  Mild osteoarthritic spurring about the sacroiliac joints. No notable hip degenerative change.  Sigmoid diverticulosis.  IMPRESSION: 1. No acute finding in the lumbar spine or pelvis. 2. Osseous metastases seen on PET-CT 04/07/2015 are not visible by CT. 3. Large stool volume. 4. Right pleural effusion is small but increased from July 2016.   Electronically Signed  By: Monte Fantasia M.D.   On: 05/18/2015 02:52   Ct Pelvis Wo Contrast  05/18/2015   CLINICAL DATA:  Low back pain.  Constipation.  EXAM: CT LUMBAR SPINE WITHOUT CONTRAST  TECHNIQUE: Multidetector CT imaging of the lumbar spine was performed without intravenous contrast administration. Multiplanar CT image reconstructions were also generated.  COMPARISON:  PET-CT 04/07/2015  FINDINGS: On scout imaging there is constipation with stool distending the entire colon.  Lumbar spine:  No acute fracture, endplate erosion, or traumatic malalignment. Bone metastases are not appreciated, including a large deposit in the L2 body seen on previous PET-CT.  Right L5 pars defect without slip.  Mild disc bulging at L4-5 and L5-S1 without suspected impingement. Mild moderate left facet arthropathy at L4-5 and L5-S1. No gross canal hematoma.  Small right pleural effusion. Right adrenal thickening, likely metastatic deposit based on previous PET-CT.  Abdominal aortic aneurysm with a 34 mm maximal diameter. Attenuated appearance of the right common and external iliac arteries consistent with chronic occlusion.  Pelvis: Status post open reduction internal fixation of the proximal right femur. There is no evidence of periprosthetic fracture or hardware loosening. No acute fracture or diastasis. Metastases within the right pelvis on prior PET-CT are not visible by CT.  Mild osteoarthritic spurring about the sacroiliac joints. No notable hip degenerative change.  Sigmoid diverticulosis.  IMPRESSION: 1. No acute finding in the lumbar spine or pelvis. 2. Osseous metastases seen on PET-CT 04/07/2015 are not visible by CT. 3. Large stool volume. 4. Right pleural effusion is small but increased from July 2016.   Electronically Signed   By: Monte Fantasia M.D.   On: 05/18/2015 02:52   Mr Jeri Cos ZO Contrast  05/05/2015   CLINICAL DATA:  64 year old male with stage IV non-small cell lung cancer diagnosed in June. Staging. Subsequent encounter.  EXAM: MRI HEAD WITHOUT AND WITH CONTRAST  TECHNIQUE: Multiplanar, multiecho pulse sequences of the brain and surrounding structures were obtained without and with intravenous contrast.  CONTRAST:  39m MULTIHANCE GADOBENATE DIMEGLUMINE 529 MG/ML IV SOLN  COMPARISON:  PET-CT 04/07/2015.  FINDINGS: There is a small triangular focus of T2 and FLAIR hyperintensity in the right cerebellum (series 6, image 7) associated with curvilinear postcontrast enhancement (series 11, image 12). However, this more resembles a small area of encephalomalacia than a mass. No associated hemosiderin. Diffusion here is facilitated.  No other abnormal enhancement identified.  No dural thickening.  Patchy T2 hyperintensity in the pons. No supratentorial encephalomalacia, with normal for age hemispheric gray and white matter signal.  No restricted diffusion to suggest acute infarction. No intracranial mass effect, ventriculomegaly, extra-axial collection or  acute intracranial hemorrhage. Cervicomedullary junction and pituitary are within normal limits. Grossly negative visualized cervical spine.  Visualized bone marrow signal is within normal limits. Negative scalp soft tissues. Orbits soft tissues are normal aside from postoperative changes to the globes. Visible internal auditory structures appear normal. Visualized paranasal sinuses and mastoids are clear.  IMPRESSION: 1. A small 7 mm focus of signal abnormality and enhancement in the right cerebellar hemisphere more resembles sequelae of small vessel ischemia or a small benign vascular malformation than a small metastasis. Recommend repeat study to document stability in 3 months, unless clinically indicated sooner. 2. Otherwise no acute or metastatic intracranial process.   Electronically Signed   By: HGenevie AnnM.D.   On: 05/05/2015 14:06   Dg Ang/ext/uni/or Right  05/22/2015   CLINICAL DATA:  64year old male with a history of bilateral  femoral embolectomy with patch angioplasty.  EXAM: LEFT ANG/EXT/UNI/ OR  CONTRAST:  Op note  FLUOROSCOPY TIME:  Op note  COMPARISON:  No comparison available  FINDINGS: Intraoperative spot images during bilateral femoral embolectomy and patch angioplasty.  Left:  Single image during lower extremity angiogram demonstrates partial opacification of the leg vasculature. Surgical clips present along the medial knee.  Right:  Single image during lower extremity angiogram demonstrates partial opacification of the leg vasculature.  IMPRESSION: Intraoperative angiogram of the left and right lower extremity, with incomplete opacification of the leg vasculature bilaterally.  Please refer to the dictated operative report for full details of intraoperative findings and procedure.  Signed,  Dulcy Fanny. Earleen Newport, DO  [facility]  [facility]   Electronically Signed   By: Corrie Mckusick D.O.   On: 05/22/2015 08:06   Dg Chest Port 1 View  05/23/2015    CLINICAL DATA:  Lung cancer, hypertension, shortness of breath  EXAM: PORTABLE CHEST - 1 VIEW  COMPARISON:  05/22/2015  FINDINGS: Bilateral interstitial and alveolar airspace opacities. Small loculated right pleural effusion. No pneumothorax. Stable cardiomediastinal silhouette. No acute osseous abnormality.  IMPRESSION: Bilateral interstitial and alveolar airspace opacities as can be seen with multi lobar pneumonia. Small partial loculated right pleural effusion slightly larger compared with 05/22/2015.   Electronically Signed   By: Kathreen Devoid   On: 05/23/2015 11:27   Dg Chest Port 1 View  05/21/2015   CLINICAL DATA:  Hypoxia, history of stage IV lung cancer with metastatic disease  EXAM: PORTABLE CHEST - 1 VIEW  COMPARISON:  03/23/2015, 04/07/2015  FINDINGS: Cardiac shadow is stable. Persistent right apical mass lesion is noted consistent with the patient's given clinical history. Patchy infiltrate is noted in the right lung base increased from the prior exam. Additionally diffuse interstitial changes are noted which may represent lymphangitic spread of carcinoma. No sizable effusion is seen. No acute bony abnormality is noted.  IMPRESSION: Stable right apical mass.  Patchy right basilar infiltrate which may represent acute pneumonia.  Diffuse interstitial changes likely related to the underlying neoplasm.   Electronically Signed   By: Inez Catalina M.D.   On: 05/21/2015 08:00   Dg Abd Acute W/chest  05/22/2015   CLINICAL DATA:  Constipation.  Shortness of breath.  Abdominal pain.  EXAM: DG ABDOMEN ACUTE W/ 1V CHEST  COMPARISON:  None.  FINDINGS: There is a moderate to large amount of fecal matter throughout the colon. Small bowel gas pattern is normal. No visible free air. No worrisome calcifications or acute bony findings. Calcification of the aorta is noted. Previous ORIF of right hip fracture.  One-view chest again shows widespread patchy density and a right apical mass. Patchy pulmonary infiltrate is  worsened in general, particularly at the right lung base.  IMPRESSION: Large amount of fecal matter in the colon consistent with the clinical diagnosis of constipation. No evidence of small bowel obstruction or perforation.  Widespread patchy pulmonary densities consistent with pneumonia, somewhat progressive in the right lower lobe.   Electronically Signed   By: Nelson Chimes M.D.   On: 05/22/2015 13:46   Dg Ang/ext/uni/or Left  05/22/2015   CLINICAL DATA:  64 year old male with a history of bilateral femoral embolectomy with patch angioplasty.  EXAM: LEFT ANG/EXT/UNI/ OR  CONTRAST:  Op note  FLUOROSCOPY TIME:  Op note  COMPARISON:  No comparison available  FINDINGS: Intraoperative spot images during bilateral femoral embolectomy and patch angioplasty.  Left:  Single image  during lower extremity angiogram demonstrates partial opacification of the leg vasculature. Surgical clips present along the medial knee.  Right:  Single image during lower extremity angiogram demonstrates partial opacification of the leg vasculature.  IMPRESSION: Intraoperative angiogram of the left and right lower extremity, with incomplete opacification of the leg vasculature bilaterally.  Please refer to the dictated operative report for full details of intraoperative findings and procedure.  Signed,  Dulcy Fanny. Earleen Newport, DO  [facility]  [facility]   Electronically Signed   By: Corrie Mckusick D.O.   On: 05/22/2015 08:06    Micro Results      Recent Results (from the past 240 hour(s))  MRSA PCR Screening     Status: None   Collection Time: 05/21/15 12:38 PM  Result Value Ref Range Status   MRSA by PCR NEGATIVE NEGATIVE Final    Comment:        The GeneXpert MRSA Assay (FDA approved for NASAL specimens only), is one component of a comprehensive MRSA colonization surveillance program. It is not intended to diagnose MRSA infection nor to guide or monitor treatment for MRSA  infections.   Urine culture     Status: None   Collection Time: 05/21/15  1:35 PM  Result Value Ref Range Status   Specimen Description URINE, RANDOM  Final   Special Requests NONE  Final   Culture NO GROWTH 1 DAY  Final   Report Status 05/22/2015 FINAL  Final  Culture, sputum-assessment     Status: None   Collection Time: 05/22/15  5:23 AM  Result Value Ref Range Status   Specimen Description SPUTUM  Final   Special Requests NONE  Final   Sputum evaluation   Final    MICROSCOPIC FINDINGS SUGGEST THAT THIS SPECIMEN IS NOT REPRESENTATIVE OF LOWER RESPIRATORY SECRETIONS. PLEASE RECOLLECT. Gram Stain Report Called to,Read Back By and Verified With: B. GROGAN,RN AT 1610 ON [id number] BY Rhea Bleacher    Report Status 05/22/2015 FINAL  Final       Today   Subjective    Mark Hurst today has no headache,no chest abdominal pain,no new weakness tingling or numbness, feels much better wants to go home today.     Objective   Blood pressure 105/54, pulse 65, temperature 98.5 F (36.9 C), temperature source Axillary, resp. rate 25, height '5\' 7"'$  (1.702 m), weight 56.3 kg (124 lb 1.9 oz), SpO2 96 %.   Intake/Output Summary (Last 24 hours) at 05/25/15 1108 Last data filed at 05/25/15 0926  Gross per 24 hour  Intake    570 ml  Output      0 ml  Net    570 ml    Exam Awake Alert, Oriented x 3, No new F.N deficits, anxious affect [place].AT,PERRAL Supple Neck,No JVD, No cervical lymphadenopathy appriciated.  Symmetrical Chest wall movement, Good air movement bilaterally, CTAB iRRR,No Gallops,Rubs or new Murmurs, No Parasternal Heave +ve B.Sounds, Abd Soft, Non tender, No organomegaly appriciated, No rebound -guarding or rigidity. No Cyanosis, Clubbing or edema, No new Rash or bruise   Data Review   CBC w Diff:  Lab Results  Component Value Date   WBC 8.2 05/25/2015   WBC 8.1 05/16/2015   HGB 9.7* 05/25/2015   HGB 13.8 05/16/2015   HCT 29.6* 05/25/2015   HCT 41.5 05/16/2015   PLT  234 05/25/2015   PLT 316 05/16/2015   LYMPHOPCT 9* 05/21/2015   LYMPHOPCT 7.0* 05/16/2015   MONOPCT 8 05/21/2015  MONOPCT 7.8 05/16/2015   EOSPCT 1 05/21/2015   EOSPCT 1.0 05/16/2015   BASOPCT 0 05/21/2015   BASOPCT 0.9 05/16/2015    CMP:  Lab Results  Component Value Date   NA 131* 05/25/2015   NA 128* 05/16/2015   K 4.3 05/25/2015   K 5.3* 05/16/2015   CL 99* 05/25/2015   CO2 17* 05/25/2015   CO2 26 05/16/2015   BUN 26* 05/25/2015   BUN 33.7* 05/16/2015   CREATININE 1.36* 05/25/2015   CREATININE 1.3 05/16/2015   PROT 5.4* 05/25/2015   PROT 7.5 05/16/2015   ALBUMIN 1.9* 05/25/2015   ALBUMIN 2.8* 05/16/2015   BILITOT 1.2 05/25/2015   BILITOT 0.29 05/16/2015   ALKPHOS 159* 05/25/2015   ALKPHOS 141 05/16/2015   AST 39 05/25/2015   AST 18 05/16/2015   ALT 26 05/25/2015   ALT 24 05/16/2015  .   Total Time in preparing paper work, data evaluation and todays exam - 35 minutes  Thurnell Lose M.D on 05/25/2015 at 11:08 AM  [facility]   Office  [phone]

## 2015-05-25 NOTE — Progress Notes (Signed)
CM received consult for home hospice. CM spoke with pt regarding home hospice and  provided pt and primary caretaker (Rex) with choice list. Pt/Rex agreed to use [facility]. CM called [facility] and referral made for home hospice, awaiting contact from Lisa(HOG)/liaison. CM  To f/u with d/c disposition. Whitman Hero RN,BSN, [place]

## 2015-05-25 NOTE — Progress Notes (Signed)
Responded to referral to assist patient with completing advance directive.  Documents done and copies given to patient and to patient's nurse for chart.

## 2015-05-25 NOTE — Progress Notes (Signed)
Daily Progress Note   Patient Name: Mark Hurst       Date: 05/25/2015 DOB: [birth year]  Age: 64 y.o. MRN#: [id number] Attending Physician: Thurnell Lose, MD Primary Care Physician: [name], MD Admit Date: 05/21/2015  Reason for Consultation/Follow-up: GOC  Subjective:     I met again today with Mark Hurst and friend, Rex. Mark Hurst is tachycardic and in distress - he has received roxanol and seroquel and with a small dose of IV metoprolol he begins to have relief. Discussed with RN and Rex that if this happens at home and the medication does not relieve he should call hospice. He was very understanding and Mark Hurst is anxious to go home and spend time with Rex and his cat. Emotional support provided.    Length of Stay: 4 days  Current Medications: Scheduled Meds:  . amiodarone  400 mg Oral BID  . apixaban  5 mg Oral BID  . atorvastatin  40 mg Oral q1800  . bisacodyl  10 mg Oral Daily  . docusate sodium  200 mg Oral BID  . feeding supplement (ENSURE ENLIVE)  237 mL Oral BID BM  . folic acid  1 mg Oral Daily  . hydrocortisone cream   Topical TID  . insulin aspart  0-5 Units Subcutaneous QHS  . insulin aspart  0-9 Units Subcutaneous TID WC  . levalbuterol  1.25 mg Nebulization 4 times per day  . loratadine  10 mg Oral Daily  . methylnaltrexone  8 mg Subcutaneous QODAY  . metoprolol  50 mg Oral BID  . nicotine  21 mg Transdermal Daily  . pantoprazole  40 mg Oral QHS  . piperacillin-tazobactam (ZOSYN)  IV  3.375 g Intravenous 3 times per day  . pneumococcal 23 valent vaccine  0.5 mL Intramuscular Once  . polyethylene glycol  17 g Oral BID  . QUEtiapine  25 mg Oral BID  . tamsulosin  0.4 mg Oral Daily  . vancomycin  750 mg Intravenous Q12H    Continuous Infusions: . sodium chloride 50 mL/hr at 05/23/15 1900    PRN Meds: alum & mag hydroxide-simeth, guaiFENesin-dextromethorphan, haloperidol lactate, hydrALAZINE, HYDROmorphone (DILAUDID) injection,  ipratropium-albuterol, magnesium sulfate 1 - 4 g bolus IVPB, metoprolol, morphine CONCENTRATE, nitroGLYCERIN, ondansetron **OR** ondansetron (ZOFRAN) IV, phenol, potassium chloride, sodium chloride  Palliative Performance Scale: 20%     Vital Signs: BP 105/54 mmHg  Pulse 65  Temp(Src) 98.5 F (36.9 C) (Axillary)  Resp 25  Ht 5' 7"  (1.702 m)  Wt 56.3 kg (124 lb 1.9 oz)  BMI 19.44 kg/m2  SpO2 96% SpO2: SpO2: 96 % O2 Device: O2 Device: NRB O2 Flow Rate: O2 Flow Rate (L/min): 15 L/min  Intake/output summary:  Intake/Output Summary (Last 24 hours) at 05/25/15 1105 Last data filed at 05/25/15 9166  Gross per 24 hour  Intake    570 ml  Output      0 ml  Net    570 ml   LBM: Last BM Date: 05/25/15 Baseline Weight: Weight: 58.7 kg (129 lb 6.6 oz) Most recent weight: Weight: 56.3 kg (124 lb 1.9 oz)  Physical Exam: General: Thin, frail, mod distressed, lying in bed HEENT: [place]/AT, temporal muscle wasting CVS: Tachycardic Resp: Labored but improved from earlier today Neuro: Awake, alert, oriented x 3 but confused in conversation at times    Additional Data Reviewed: Recent Labs     05/24/15  0220  05/25/15  0240  WBC  8.0  8.2  HGB  10.6*  9.7*  PLT  257  234  NA  131*  131*  BUN  17  26*  CREATININE  0.97  1.36*     Problem List:  Patient Active Problem List   Diagnosis Date Noted  . Malnutrition of moderate degree 05/24/2015  . Palliative care encounter 05/24/2015  . DNR (do not resuscitate) 05/24/2015  . SOB (shortness of breath)   . Cancer associated pain 05/23/2015  . Constipation 05/23/2015  . Dehydration 05/23/2015  . 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 Cold extremities 05/21/2015  . Arterial occlusion   . Colon polyps 05/02/2015  . Back pain 05/02/2015  . Bone metastases 05/02/2015  . Lung cancer 04/12/2015  . Atrial fibrillation with rapid ventricular response 03/07/2015  . PAD (peripheral artery disease) 03/04/2015    . Chest pain 03/03/2015  . CAD (coronary artery disease) 03/03/2015  . Lung mass 03/03/2015  . Solitary pulmonary nodule   . Hypoxemia   . Tobacco abuse   . Other emphysema   . Essential hypertension 06/02/2009  . Type 2 diabetes mellitus 03/30/2009  . Hyperlipidemia 03/30/2009  . Coronary atherosclerosis 03/30/2009  . 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   3. Symptom Management:  Bowel regimen: Disimpacted today. Continue scheduled Miralax and would recommend Senokot-S 2 tablets qhs.  Pain: Roxanol 20 mg every 1 hour so that he may go home with this regimen.   Dyspnea: May also use roxanol for dyspnea control along with nebulizers and oxygen for comfort.   Anxiety: Continue Seroquel BID.  Cough: Agree with Robitussin DM prn but he has not been getting this - may need scheduled dosing.  4. Palliative Prophylaxis:  Stool Softner: yes  Prognosis: Could be days to weeks  Discharge Planning: Home with Hospice   Thank you for allowing the Palliative Medicine Team to assist in the care of this patient.   Time In: 1045 Time Out: 1115 Total Time 63mn Prolonged Time Billed  no     Greater than 50%  of this time was spent counseling and coordinating care related to the above assessment and plan.   AVinie Sill NP Palliative Medicine Team Pager # [phone](M-F 8a-5p) Team Phone # [phone](Nights/Weekends)  05/25/2015, 11:05 AM

## 2015-05-25 NOTE — Progress Notes (Addendum)
Physician notified: Candiss Norse At: 0915  Regarding: Pt bearing down for BM, developed CP. HR 170s, SpO2 85%, 6LPM . Very anxious, labored breathing, rhocni. Gave 1 tab NTG SL, '5mg'$  IVP lopressor, PO morphine. HR continues to be 150-160s.  Awaiting return response.   Returned Response at: 0916  Order(s): Give '5mg'$  IVP lopressor again.  Physician notified: Candiss Norse At: 0919  Regarding: FYI unable to give second dose Lopressor d/t BP 80/40s. now in AF, HR 140s. Neb given. very rhoncus, labored breathing. cont monitor. Awaiting return response.   Returned Response at: 0920  Order(s): page cardiology.   Physician notified: Croitoru At: 0926  Regarding: see above, Current AF, HR 140s, SOB, BP 80/40s. Planned home hospice today.  Awaiting return response.   Returned Response at: 0926  Order(s): start Amiodarone '400mg'$  PO now then BID. DC tele.

## 2015-05-26 ENCOUNTER — Telehealth: Payer: Self-pay | Admitting: Vascular Surgery

## 2015-05-26 LAB — GLUCOSE, CAPILLARY
GLUCOSE-CAPILLARY: 148 mg/dL — AB (ref 65–99)
Glucose-Capillary: 148 mg/dL — ABNORMAL HIGH (ref 65–99)

## 2015-05-26 MED ORDER — GLYCOPYRROLATE 0.2 MG/ML IJ SOLN
0.2000 mg | Freq: Once | INTRAMUSCULAR | Status: AC
Start: 2015-05-26 — End: 2015-05-26
  Administered 2015-05-26: 0.2 mg via INTRAVENOUS
  Filled 2015-05-26: qty 1

## 2015-05-26 MED ORDER — MORPHINE SULFATE (CONCENTRATE) 10 MG/0.5ML PO SOLN: 10.0000 mg | Freq: Three times a day (TID) | ORAL | Status: DC

## 2015-05-26 MED ORDER — MORPHINE SULFATE (CONCENTRATE) 10 MG/0.5ML PO SOLN: 20.0000 mg | ORAL | Status: DC | PRN

## 2015-05-26 MED ORDER — MORPHINE SULFATE (PF) 2 MG/ML IV SOLN
INTRAVENOUS | Status: AC
  Administered 2015-05-26: 2 mg via INTRAMUSCULAR
  Filled 2015-05-26: qty 1

## 2015-05-26 MED ORDER — FUROSEMIDE 10 MG/ML IJ SOLN
40.0000 mg | Freq: Once | INTRAMUSCULAR | Status: AC
Start: 2015-05-26 — End: 2015-05-26
  Administered 2015-05-26: 40 mg via INTRAVENOUS

## 2015-05-26 MED ORDER — MORPHINE SULFATE (PF) 2 MG/ML IV SOLN: 2.0000 mg | Freq: Once | INTRAVENOUS | Status: AC

## 2015-05-26 MED ORDER — GLYCOPYRROLATE 1 MG PO TABS
1.0000 mg | ORAL_TABLET | Freq: Three times a day (TID) | ORAL | Status: DC | PRN
  Filled 2015-05-26: qty 1

## 2015-05-26 MED ORDER — FUROSEMIDE 10 MG/ML IJ SOLN
INTRAMUSCULAR | Status: AC
  Administered 2015-05-26: 40 mg via INTRAVENOUS
  Filled 2015-05-26: qty 4

## 2015-05-26 MED ORDER — MORPHINE SULFATE 25 MG/ML IV SOLN
2.0000 mg/h | Freq: Once | INTRAVENOUS | Status: DC
  Filled 2015-05-26: qty 10

## 2015-05-26 NOTE — Progress Notes (Signed)
   VASCULAR SURGERY ASSESSMENT & PLAN:  * 5 Days Post-Op s/p:    1. Bilateral common femoral artery endarterectomies with bovine pericardial patch   angioplasties 2. Bilateral femoral embolectomies 3. Left to right femorofemoral bypass graft (8 mm Dacron graft) 4. Intraoperative arteriogram 2  *  Feet warm.   * Not much to add from a vascular standpoint.  SUBJECTIVE: Arousable.  PHYSICAL EXAM: Filed Vitals:   05/24/2015 0210 05/12/2015 0301 05/21/2015 0310 05/11/2015 0620  BP: 159/70  156/64 123/97  Pulse: 85  123 133  Temp:  97.5 F (36.4 C)    TempSrc:  Axillary    Resp: '25  26 29  '$ Height:      Weight:      SpO2: 87%  100% 100%   Feet warm Incisions look fine Lungs: rhonchi  LABS: Lab Results  Component Value Date   WBC 8.2 05/25/2015   HGB 9.7* 05/25/2015   HCT 29.6* 05/25/2015   MCV 87.6 05/25/2015   PLT 234 05/25/2015   Lab Results  Component Value Date   CREATININE 1.36* 05/25/2015   Lab Results  Component Value Date   INR 0.96 03/23/2015   CBG (last 3)   Recent Labs  05/25/15 1111 05/25/15 1642 05/25/2015 0814  GLUCAP 152* 144* 148*    Principal Problem:   Arterial occlusion Active Problems:   Hyperlipidemia   Essential hypertension   WOLFF (WOLFE)-PARKINSON-WHITE (WPW) SYNDROME   CAD (coronary artery disease)   Tobacco abuse   Atrial fibrillation with rapid ventricular response   Lung cancer   Cold extremities   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

## 2015-05-26 NOTE — Telephone Encounter (Signed)
-----   Message from Denman George, RN sent at 05/30/2015 10:17 AM EDT ----- Regarding: needs 2 wk. f/u with [facility] S/p Left - Right Fem-Fem BP 05/21/15  ----- Message -----    From: Ulyses Amor, PA-C    Sent: 05/17/2015   7:26 AM      To: Vvs Charge Pool  F/U with Dr. Scot Dock s/p fem-fem by pass

## 2015-05-26 NOTE — Progress Notes (Addendum)
Patient Demographics:    Mark Hurst, is a 64 y.o. male, DOB - 02/19/1951, HGD:[id number]  Admit date - 05/21/2015   Admitting Physician Theodis Blaze, MD  Outpatient Primary MD for the patient is [name], MD  LOS - 5   Chief Complaint  Patient presents with  . Leg Pain  . Constipation        Subjective:    [name] today has, No headache, No chest pain, No abdominal pain - No Nausea, No new weakness tingling or numbness, No Cough - this is a shortness of breath and anxiety. Wants to go home..   Assessment  & Plan :    Patient is now DO NOT RESUSCITATE, has been discharged with home hospice. Goal of care is comfort. Await placement arrangements.   1. Bilateral femoral artery embolization with ischemia - he is post by lateral femoral embolectomy by Dr. Doren Custard on 05/22/2015, has chronic left leg weakness for which PT and OT will be consulted. Continue supportive care. Likely embolization due to underlying history of paroxysmal atrial fibrillation along with lung cancer with hypercoagulable state. Placed on Eliquis, can be stopped if he declines further. Now goal of care is comfort to be discharged with home hospice. Due to underlying problem #3 as below.   2. Paroxysmal atrial fibrillation with RVR. [place] Vasc 2 score now least greater than 4. Embolization to bilateral femoral artery, was on Eliquis what was held for the last 2-3 weeks for a dental procedure which has been long done, has been placed on Lopressor along with amiodarone, cardiology following, recent echocardiogram noted with preserved EF of 60%. Eliquis has been resumed pharmacy monitoring. Goal of care comfort. If declines further all heart medications can be stopped.   3. 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 He was to undergo palliative chemotherapy in the future. Discussed his prognosis with his primary oncologist Dr. Julien Nordmann agrees with transitioning to full hospice and comfort care. Palliative care has been consulted. His prognosis appears very bleak. He has been technically discharged with home hospice. Await arrangements to be made.   4. Acute hypoxic respiratory failure due to Sepsis due Aspiration HCAP. Placed on appropriate anti-biotics. Monitor cultures, supportive care with oxygen and nebulizer treatments as needed. Clinical possible mild fluid overload will give a trial of IV Lasix as well. Not showing signs of improvement and overall continues to decline, Discussed his prognosis with his primary oncologist Dr. Julien Nordmann agrees with full transition to full hospice and comfort care.   5. Ongoing smoking. Counseled to quit.   6. Essential hypertension. On ACE inhibitor, added low-dose Lopressor for better control.   7. Dyslipidemia. Continue home dose statin.    8. Constipation. Placed on bowel regimen. Improved.  9. Mild elevation in troponin. Troponin rise in none serious pattern, no chest pain, likely mild demand ischemia from RVR, on Eliquis, on aspirin which will be stopped if okay by vascular surgeon. On beta blocker, requested to stop smoking. Cardiology consulted. In my opinion he is a candidate for medical management only due to his underlying with his above listed comorbidities.   10.  DM type II. Currently on sliding scale continue and monitor  CBG control.  CBG (last 3)   Recent Labs  05/25/15 0711 05/25/15 1111 05/25/15 1642  GLUCAP 154* 152* 144*      Code Status : DO NOT RESUSCITATE. Prognosis is poor has been discussed with patient and friend bedside in detail. Has been discharged with home hospice per his wishes he did not want residential hospice.  Family Communication  : Friend bedside  Disposition  Plan  : Likely home in the next 3-4 days  Consults  :  Vascular surgeon Dr. Doren Custard, Cards, [facility] Care  Procedures  :   By vascular surgeon Dr. Doren Custard on 05/21/2015  1. Bilateral common femoral artery endarterectomies with bovine pericardial patch angioplasties 2. Bilateral femoral embolectomies 3. Left to right femorofemoral bypass graft (8 mm Dacron graft) 4. Intraoperative arteriogram 2   TTE 03-04-15  - Left ventricle: The cavity size was normal. Wall thickness wasincreased increased in a pattern of mild to moderate LVH. Theestimated ejection fraction was 60%. Wall motion was normal;there were no regional wall motion abnormalities. - Aortic valve: Not sure if two or three cusps. The more anteriorcusp may dome. There is very slight gradient across the valve. Trivial AI. - Right ventricle: The cavity size was normal. Systolic functionwas normal.   DVT Prophylaxis  :  Lovenox    Lab Results  Component Value Date   PLT 234 05/25/2015    Inpatient Medications  Scheduled Meds: . amiodarone  400 mg Oral BID  . apixaban  5 mg Oral BID  . atorvastatin  40 mg Oral q1800  . bisacodyl  10 mg Oral Daily  . docusate sodium  200 mg Oral BID  . feeding supplement (ENSURE ENLIVE)  237 mL Oral BID BM  . folic acid  1 mg Oral Daily  . hydrocortisone cream   Topical TID  . insulin aspart  0-5 Units Subcutaneous QHS  . insulin aspart  0-9 Units Subcutaneous TID WC  . levalbuterol  1.25 mg Nebulization 4 times per day  . loratadine  10 mg Oral Daily  . methylnaltrexone  8 mg Subcutaneous QODAY  . metoprolol  50 mg Oral BID  . nicotine  21 mg Transdermal Daily  . pantoprazole  40 mg Oral QHS  . piperacillin-tazobactam (ZOSYN)  IV  3.375 g Intravenous 3 times per day  . pneumococcal 23 valent vaccine  0.5 mL Intramuscular Once  . polyethylene glycol  17 g Oral BID  . QUEtiapine  25 mg Oral BID  . tamsulosin  0.4 mg Oral Daily  . vancomycin  750 mg Intravenous Q12H   Continuous  Infusions: . sodium chloride 50 mL/hr at 05/23/15 1900   PRN Meds:.alum & mag hydroxide-simeth, guaiFENesin-dextromethorphan, haloperidol lactate, hydrALAZINE, HYDROmorphone (DILAUDID) injection, ipratropium-albuterol, magnesium sulfate 1 - 4 g bolus IVPB, metoprolol, morphine CONCENTRATE, nitroGLYCERIN, ondansetron **OR** ondansetron (ZOFRAN) IV, phenol, potassium chloride, sodium chloride  Antibiotics  :    Anti-infectives    Start     Dose/Rate Route Frequency Ordered Stop   05/22/15 2200  vancomycin (VANCOCIN) IVPB 750 mg/150 ml premix     750 mg 150 mL/hr over 60 Minutes Intravenous Every 12 hours 05/22/15 0945     05/22/15 1400  piperacillin-tazobactam (ZOSYN) IVPB 3.375 g     3.375 g 12.5 mL/hr over 240 Minutes Intravenous 3 times per day 05/22/15 0945     05/22/15 1030  vancomycin (VANCOCIN) IVPB 1000 mg/200 mL premix     1,000 mg 200 mL/hr over 60 Minutes Intravenous  Once 05/22/15 0945  05/22/15 1212   05/21/15 2200  azithromycin (ZITHROMAX) 500 mg in dextrose 5 % 250 mL IVPB  Status:  Discontinued     500 mg 250 mL/hr over 60 Minutes Intravenous Every 24 hours 05/21/15 1354 05/22/15 0945   05/21/15 2100  cefTRIAXone (ROCEPHIN) 1 g in dextrose 5 % 50 mL IVPB  Status:  Discontinued     1 g 100 mL/hr over 30 Minutes Intravenous Every 24 hours 05/21/15 1354 05/22/15 0945   05/21/15 1330  ceFAZolin (ANCEF) IVPB 2 g/50 mL premix    Comments:  Send with pt to OR   2 g 100 mL/hr over 30 Minutes Intravenous To [facility] 05/21/15 1315 05/21/15 1501   05/21/15 0830  piperacillin-tazobactam (ZOSYN) IVPB 3.375 g     3.375 g 100 mL/hr over 30 Minutes Intravenous  Once 05/21/15 0823 05/21/15 0854   05/21/15 0815  piperacillin-tazobactam (ZOSYN) IVPB 3.375 g  Status:  Discontinued     3.375 g 12.5 mL/hr over 240 Minutes Intravenous  Once 05/21/15 0808 05/21/15 0823        Objective:   Filed Vitals:   05/06/2015 0210 05/21/2015 0301 05/13/2015 0310 05/07/2015 0620  BP: 159/70   156/64 123/97  Pulse: 85  123 133  Temp:  97.5 F (36.4 C)    TempSrc:  Axillary    Resp: '25  26 29  '$ Height:      Weight:      SpO2: 87%  100% 100%    Wt Readings from Last 3 Encounters:  05/25/15 56.3 kg (124 lb 1.9 oz)  05/11/15 61.689 kg (136 lb)  05/02/15 61.236 kg (135 lb)     Intake/Output Summary (Last 24 hours) at 05/21/2015 0828 Last data filed at 05/03/2015 0634  Gross per 24 hour  Intake    600 ml  Output      0 ml  Net    600 ml     Physical Exam  Awake Alert, Oriented X 3, appears mildly short of breath, chronic left leg weakness, Anxious affect. No apparent focal deficits. [place].AT,PERRAL Supple Neck,No JVD, No cervical lymphadenopathy appriciated.  Symmetrical Chest wall movement, Mod air movement bilaterally, Coarse Bilat B sounds Rapid iRRR,No Gallops,Rubs or new Murmurs, No Parasternal Heave +ve B.Sounds, Abd Soft, No tenderness, No organomegaly appriciated, No rebound - guarding or rigidity. No Cyanosis, Clubbing or edema, No new Rash or bruise      Data Review:   Micro Results Recent Results (from the past 240 hour(s))  MRSA PCR Screening     Status: None   Collection Time: 05/21/15 12:38 PM  Result Value Ref Range Status   MRSA by PCR NEGATIVE NEGATIVE Final    Comment:        The GeneXpert MRSA Assay (FDA approved for NASAL specimens only), is one component of a comprehensive MRSA colonization surveillance program. It is not intended to diagnose MRSA infection nor to guide or monitor treatment for MRSA infections.   Urine culture     Status: None   Collection Time: 05/21/15  1:35 PM  Result Value Ref Range Status   Specimen Description URINE, RANDOM  Final   Special Requests NONE  Final   Culture NO GROWTH 1 DAY  Final   Report Status 05/22/2015 FINAL  Final  Culture, sputum-assessment     Status: None   Collection Time: 05/22/15  5:23 AM  Result Value Ref Range Status   Specimen Description SPUTUM  Final   Special Requests NONE  Final   Sputum evaluation   Final    MICROSCOPIC FINDINGS SUGGEST THAT THIS SPECIMEN IS NOT REPRESENTATIVE OF LOWER RESPIRATORY SECRETIONS. PLEASE RECOLLECT. Gram Stain Report Called to,Read Back By and Verified With: B. GROGAN,RN AT [id number] ON [id number] BY Rhea Bleacher    Report Status 05/22/2015 FINAL  Final    Radiology Reports Ct Head Wo Contrast  05/22/2015   CLINICAL DATA:  Confusion.  EXAM: CT HEAD WITHOUT CONTRAST  TECHNIQUE: Contiguous axial images were obtained from the base of the skull through the vertex without intravenous contrast.  COMPARISON:  MRI brain 05/05/2015  FINDINGS: Mild diffuse cerebral atrophy. Patchy low-attenuation changes in the deep white matter. This is likely due to small vessel ischemia. No mass effect or midline shift. No abnormal extra-axial fluid collections. Gray-white matter junctions are distinct. Basal cisterns are not effaced. No evidence of acute intracranial hemorrhage. No depressed skull fractures. Visualized paranasal sinuses and mastoid air cells are not opacified. Vascular calcifications.  IMPRESSION: No acute intracranial abnormalities. Mild chronic atrophy and small vessel ischemic changes.   Electronically Signed   By: Lucienne Capers M.D.   On: 05/22/2015 18:27   Ct Angio Chest Pe W/cm &/or Wo Cm  05/23/2015   CLINICAL DATA:  Shortness of breath. Lung cancer with metastasis. Hypercoagulability.  EXAM: CT ANGIOGRAPHY CHEST WITH CONTRAST  TECHNIQUE: Multidetector CT imaging of the chest was performed using the standard protocol during bolus administration of intravenous contrast. Multiplanar CT image reconstructions and MIPs were obtained to evaluate the vascular anatomy.  CONTRAST:  48m OMNIPAQUE IOHEXOL 350 MG/ML SOLN  COMPARISON:  03/23/2015  FINDINGS: Technically adequate study with good opacification of the central and segmental pulmonary arteries. No focal filling defects demonstrated. No evidence of significant pulmonary embolus. There is right hilar  lymphadenopathy which causes focal constriction of the right main pulmonary artery but the vessel remains patent despite focal stenosis.  Normal heart size. Normal caliber thoracic aorta. Aortic calcification. No aortic dissection. Coronary artery calcification. Esophagus is decompressed. Pretracheal lymphadenopathy measures 14 mm short axis dimension. Right hilar lymphadenopathy measures up to 17 mm diameter.  Right upper lung mass is again demonstrated in appears larger than the prior study, although additional disease is present in the right lung which makes measurement difficult. The mass appears to measure about 3.7 x 4.1 cm today. Focal cavitation in the lesion. Lesion abuts the pleural surface and there is associated pleural thickening. Pleural invasion is not excluded.  Since the previous study, there is interval development of a large right pleural effusion and a small left pleural effusion. Interval development of diffuse patchy interstitial nodular changes throughout both lungs, probably representing lymphangitic tumor spread. Diffuse bronchial wall thickening. Diffuse bronchitis with with diffuse patchy bronchopneumonia not excluded. No pneumothorax.  Included portions of the upper abdominal organs demonstrate increased nodular soft tissue density in the left suprarenal space, incompletely included within the study. This measures about 3.6 cm maximal diameter. With the normal adrenal gland is not demonstrated in this likely represents a new adrenal gland metastasis. However, due to the field of view, the it is also possible this is just the top of the kidney. Visualized portion of the right adrenal gland is unremarkable in the other upper abdominal organs visualize are grossly unremarkable. Degenerative changes in the spine. Mild compression of a mid thoracic vertebra, new since prior study. This may indicate developing bone metastasis.  Review of the MIP images confirms the above findings.  IMPRESSION:  No evidence of significant pulmonary  embolus although there is focal nonocclusive stenosis of the right main pulmonary artery caused by right hilar lymphadenopathy. Since prior study, there is enlargement of the right apical mass lesion with small focal cavitation. Pleural thickening may indicate pleural involvement. New large right pleural effusion and small left pleural effusion. Diffuse nodular interstitial pattern throughout both lungs with bronchial wall thickening. This is likely to represent lymphangitic tumor metastases but diffuse bronchiolitis and bronchopneumonia is not excluded. Possible developing left adrenal gland metastasis although incompletely included on the study. New central endplate compression of a mid thoracic vertebra a may indicate bone metastasis.   Electronically Signed   By: Lucienne Capers M.D.   On: 05/23/2015 22:15   Ct Lumbar Spine Wo Contrast  05/18/2015   CLINICAL DATA:  Low back pain.  Constipation.  EXAM: CT LUMBAR SPINE WITHOUT CONTRAST  TECHNIQUE: Multidetector CT imaging of the lumbar spine was performed without intravenous contrast administration. Multiplanar CT image reconstructions were also generated.  COMPARISON:  PET-CT 04/07/2015  FINDINGS: On scout imaging there is constipation with stool distending the entire colon.  Lumbar spine:  No acute fracture, endplate erosion, or traumatic malalignment. Bone metastases are not appreciated, including a large deposit in the L2 body seen on previous PET-CT.  Right L5 pars defect without slip.  Mild disc bulging at L4-5 and L5-S1 without suspected impingement. Mild moderate left facet arthropathy at L4-5 and L5-S1. No gross canal hematoma.  Small right pleural effusion. Right adrenal thickening, likely metastatic deposit based on previous PET-CT. Abdominal aortic aneurysm with a 34 mm maximal diameter. Attenuated appearance of the right common and external iliac arteries consistent with chronic occlusion.  Pelvis: Status post  open reduction internal fixation of the proximal right femur. There is no evidence of periprosthetic fracture or hardware loosening. No acute fracture or diastasis. Metastases within the right pelvis on prior PET-CT are not visible by CT.  Mild osteoarthritic spurring about the sacroiliac joints. No notable hip degenerative change.  Sigmoid diverticulosis.  IMPRESSION: 1. No acute finding in the lumbar spine or pelvis. 2. Osseous metastases seen on PET-CT 04/07/2015 are not visible by CT. 3. Large stool volume. 4. Right pleural effusion is small but increased from July 2016.   Electronically Signed   By: Monte Fantasia M.D.   On: 05/18/2015 02:52   Ct Pelvis Wo Contrast  05/18/2015   CLINICAL DATA:  Low back pain.  Constipation.  EXAM: CT LUMBAR SPINE WITHOUT CONTRAST  TECHNIQUE: Multidetector CT imaging of the lumbar spine was performed without intravenous contrast administration. Multiplanar CT image reconstructions were also generated.  COMPARISON:  PET-CT 04/07/2015  FINDINGS: On scout imaging there is constipation with stool distending the entire colon.  Lumbar spine:  No acute fracture, endplate erosion, or traumatic malalignment. Bone metastases are not appreciated, including a large deposit in the L2 body seen on previous PET-CT.  Right L5 pars defect without slip.  Mild disc bulging at L4-5 and L5-S1 without suspected impingement. Mild moderate left facet arthropathy at L4-5 and L5-S1. No gross canal hematoma.  Small right pleural effusion. Right adrenal thickening, likely metastatic deposit based on previous PET-CT. Abdominal aortic aneurysm with a 34 mm maximal diameter. Attenuated appearance of the right common and external iliac arteries consistent with chronic occlusion.  Pelvis: Status post open reduction internal fixation of the proximal right femur. There is no evidence of periprosthetic fracture or hardware loosening. No acute fracture or diastasis. Metastases within the right pelvis on prior  PET-CT are not  visible by CT.  Mild osteoarthritic spurring about the sacroiliac joints. No notable hip degenerative change.  Sigmoid diverticulosis.  IMPRESSION: 1. No acute finding in the lumbar spine or pelvis. 2. Osseous metastases seen on PET-CT 04/07/2015 are not visible by CT. 3. Large stool volume. 4. Right pleural effusion is small but increased from July 2016.   Electronically Signed   By: Monte Fantasia M.D.   On: 05/18/2015 02:52   Mr Jeri Cos ZO Contrast  05/05/2015   CLINICAL DATA:  64 year old male with stage IV non-small cell lung cancer diagnosed in June. Staging. Subsequent encounter.  EXAM: MRI HEAD WITHOUT AND WITH CONTRAST  TECHNIQUE: Multiplanar, multiecho pulse sequences of the brain and surrounding structures were obtained without and with intravenous contrast.  CONTRAST:  63m MULTIHANCE GADOBENATE DIMEGLUMINE 529 MG/ML IV SOLN  COMPARISON:  PET-CT 04/07/2015.  FINDINGS: There is a small triangular focus of T2 and FLAIR hyperintensity in the right cerebellum (series 6, image 7) associated with curvilinear postcontrast enhancement (series 11, image 12). However, this more resembles a small area of encephalomalacia than a mass. No associated hemosiderin. Diffusion here is facilitated.  No other abnormal enhancement identified.  No dural thickening.  Patchy T2 hyperintensity in the pons. No supratentorial encephalomalacia, with normal for age hemispheric gray and white matter signal.  No restricted diffusion to suggest acute infarction. No intracranial mass effect, ventriculomegaly, extra-axial collection or acute intracranial hemorrhage. Cervicomedullary junction and pituitary are within normal limits. Grossly negative visualized cervical spine.  Visualized bone marrow signal is within normal limits. Negative scalp soft tissues. Orbits soft tissues are normal aside from postoperative changes to the globes. Visible internal auditory structures appear normal. Visualized paranasal sinuses and  mastoids are clear.  IMPRESSION: 1. A small 7 mm focus of signal abnormality and enhancement in the right cerebellar hemisphere more resembles sequelae of small vessel ischemia or a small benign vascular malformation than a small metastasis. Recommend repeat study to document stability in 3 months, unless clinically indicated sooner. 2. Otherwise no acute or metastatic intracranial process.   Electronically Signed   By: HGenevie AnnM.D.   On: 05/05/2015 14:06   Dg Ang/ext/uni/or Right  05/22/2015   CLINICAL DATA:  64year old male with a history of bilateral femoral embolectomy with patch angioplasty.  EXAM: LEFT ANG/EXT/UNI/ OR  CONTRAST:  Op note  FLUOROSCOPY TIME:  Op note  COMPARISON:  No comparison available  FINDINGS: Intraoperative spot images during bilateral femoral embolectomy and patch angioplasty.  Left:  Single image during lower extremity angiogram demonstrates partial opacification of the leg vasculature. Surgical clips present along the medial knee.  Right:  Single image during lower extremity angiogram demonstrates partial opacification of the leg vasculature.  IMPRESSION: Intraoperative angiogram of the left and right lower extremity, with incomplete opacification of the leg vasculature bilaterally.  Please refer to the dictated operative report for full details of intraoperative findings and procedure.  Signed,  JDulcy Fanny WEarleen Newport DO  [facility]  [facility]   Electronically Signed   By: JCorrie MckusickD.O.   On: 05/22/2015 08:06   Dg Chest Port 1 View  05/23/2015   CLINICAL DATA:  Lung cancer, hypertension, shortness of breath  EXAM: PORTABLE CHEST - 1 VIEW  COMPARISON:  05/22/2015  FINDINGS: Bilateral interstitial and alveolar airspace opacities. Small loculated right pleural effusion. No pneumothorax. Stable cardiomediastinal silhouette. No acute osseous abnormality.  IMPRESSION: Bilateral interstitial and alveolar airspace opacities as can be seen  with multi  lobar pneumonia. Small partial loculated right pleural effusion slightly larger compared with 05/22/2015.   Electronically Signed   By: Kathreen Devoid   On: 05/23/2015 11:27   Dg Chest Port 1 View  05/21/2015   CLINICAL DATA:  Hypoxia, history of stage IV lung cancer with metastatic disease  EXAM: PORTABLE CHEST - 1 VIEW  COMPARISON:  03/23/2015, 04/07/2015  FINDINGS: Cardiac shadow is stable. Persistent right apical mass lesion is noted consistent with the patient's given clinical history. Patchy infiltrate is noted in the right lung base increased from the prior exam. Additionally diffuse interstitial changes are noted which may represent lymphangitic spread of carcinoma. No sizable effusion is seen. No acute bony abnormality is noted.  IMPRESSION: Stable right apical mass.  Patchy right basilar infiltrate which may represent acute pneumonia.  Diffuse interstitial changes likely related to the underlying neoplasm.   Electronically Signed   By: Inez Catalina M.D.   On: 05/21/2015 08:00   Dg Abd Acute W/chest  05/22/2015   CLINICAL DATA:  Constipation.  Shortness of breath.  Abdominal pain.  EXAM: DG ABDOMEN ACUTE W/ 1V CHEST  COMPARISON:  None.  FINDINGS: There is a moderate to large amount of fecal matter throughout the colon. Small bowel gas pattern is normal. No visible free air. No worrisome calcifications or acute bony findings. Calcification of the aorta is noted. Previous ORIF of right hip fracture.  One-view chest again shows widespread patchy density and a right apical mass. Patchy pulmonary infiltrate is worsened in general, particularly at the right lung base.  IMPRESSION: Large amount of fecal matter in the colon consistent with the clinical diagnosis of constipation. No evidence of small bowel obstruction or perforation.  Widespread patchy pulmonary densities consistent with pneumonia, somewhat progressive in the right lower lobe.   Electronically Signed   By: Nelson Chimes M.D.   On:  05/22/2015 13:46   Dg Ang/ext/uni/or Left  05/22/2015   CLINICAL DATA:  64 year old male with a history of bilateral femoral embolectomy with patch angioplasty.  EXAM: LEFT ANG/EXT/UNI/ OR  CONTRAST:  Op note  FLUOROSCOPY TIME:  Op note  COMPARISON:  No comparison available  FINDINGS: Intraoperative spot images during bilateral femoral embolectomy and patch angioplasty.  Left:  Single image during lower extremity angiogram demonstrates partial opacification of the leg vasculature. Surgical clips present along the medial knee.  Right:  Single image during lower extremity angiogram demonstrates partial opacification of the leg vasculature.  IMPRESSION: Intraoperative angiogram of the left and right lower extremity, with incomplete opacification of the leg vasculature bilaterally.  Please refer to the dictated operative report for full details of intraoperative findings and procedure.  Signed,  Dulcy Fanny. Earleen Newport, DO  [facility]  [facility]   Electronically Signed   By: Corrie Mckusick D.O.   On: 05/22/2015 08:06     CBC  Recent Labs Lab 05/21/15 0754  05/21/15 2200 05/22/15 0615 05/23/15 0010 05/24/15 0220 05/25/15 0240  WBC 8.5  --  6.0 6.8 7.4 8.0 8.2  HGB 13.3  < > 11.3* 11.3* 11.2* 10.6* 9.7*  HCT 39.1  < > 33.0* 34.0* 33.4* 31.3* 29.6*  PLT 301  --  226 230 219 257 234  MCV 86.9  --  86.2 87.4 88.1 86.7 87.6  MCH 29.6  --  29.5 29.0 29.6 29.4 28.7  MCHC 34.0  --  34.2 33.2 33.5 33.9 32.8  RDW 13.0  --  13.0 13.2 13.3 13.3 13.6  LYMPHSABS  0.8  --   --   --   --   --   --   MONOABS 0.7  --   --   --   --   --   --   EOSABS 0.1  --   --   --   --   --   --   BASOSABS 0.0  --   --   --   --   --   --   < > = values in this interval not displayed.  Chemistries   Recent Labs Lab 05/21/15 0754 05/21/15 0759 05/21/15 2200 05/22/15 0615 05/23/15 0010 05/24/15 0220 05/25/15 0240  NA 128* 127*  --  131* 132* 131* 131*  K 4.7 4.7  --  4.6  4.3 4.4 4.3  CL 94* 97*  --  99* 99* 99* 99*  CO2 24  --   --  20* 22 19* 17*  GLUCOSE 242* 243*  --  175* 181* 154* 195*  BUN 30* 29*  --  20 21* 17 26*  CREATININE 0.79 0.90 0.90 0.93 0.93 0.97 1.36*  CALCIUM 9.9  --   --  8.4* 8.8* 8.5* 8.5*  MG  --   --   --   --  1.7  --   --   AST 24  --   --   --  77* 54* 39  ALT 22  --   --   --  32 30 26  ALKPHOS 112  --   --   --  112 166* 159*  BILITOT 0.3  --   --   --  0.7 0.8 1.2   ------------------------------------------------------------------------------------------------------------------ estimated creatinine clearance is 43.7 mL/min (by C-G formula based on Cr of 1.36). ------------------------------------------------------------------------------------------------------------------ No results for input(s): HGBA1C in the last 72 hours. ------------------------------------------------------------------------------------------------------------------  Recent Labs  05/24/15 0220  CHOL 107  HDL 21*  LDLCALC 62  TRIG 121  CHOLHDL 5.1   ------------------------------------------------------------------------------------------------------------------ No results for input(s): TSH, T4TOTAL, T3FREE, THYROIDAB in the last 72 hours.  Invalid input(s): FREET3 ------------------------------------------------------------------------------------------------------------------ No results for input(s): VITAMINB12, FOLATE, FERRITIN, TIBC, IRON, RETICCTPCT in the last 72 hours.  Coagulation profile No results for input(s): INR, PROTIME in the last 168 hours.  No results for input(s): DDIMER in the last 72 hours.  Cardiac Enzymes  Recent Labs Lab 05/22/15 1110 05/22/15 1830 05/23/15 0010  TROPONINI 0.49* 0.50* 0.27*   ------------------------------------------------------------------------------------------------------------------ Invalid input(s): POCBNP   Time Spent in minutes   35   SINGH,PRASHANT K M.D on 05/17/2015 at 8:28  AM  Between 7am to 7pm - Pager - [phone]  After 7pm go to [email or web address] - password [facility]  [facility] -  Office  [phone]

## 2015-05-26 NOTE — Progress Notes (Signed)
[facility]  Subjective  - Breathing troubles, currently on breathing treatment.   Objective 156/64 123 97.5 F (36.4 C) (Axillary) 26 100%  Intake/Output Summary (Last 24 hours) at 05/09/2015 0721 Last data filed at 05/02/2015 2449  Gross per 24 hour  Intake    550 ml  Output      0 ml  Net    550 ml    Feet warm well perfused Active range of motion intact and equal bilateral Groins soft without hematoma Palpable fem-fem bypass pulse Lungs labored breathing   Assessment/Planning: POD # 5  1. Bilateral common femoral artery endarterectomies with bovine pericardial patch angioplasties 2. Bilateral femoral embolectomies 3. Left to right femorofemoral bypass graft (8 mm Dacron graft) 4. Intraoperative arteriogram 2 Stable from a vascular stand point  Laurence Slate [facility] 05/07/2015 7:21 AM --  Laboratory Lab Results:  Recent Labs  05/24/15 0220 05/25/15 0240  WBC 8.0 8.2  HGB 10.6* 9.7*  HCT 31.3* 29.6*  PLT 257 234   BMET  Recent Labs  05/24/15 0220 05/25/15 0240  NA 131* 131*  K 4.4 4.3  CL 99* 99*  CO2 19* 17*  GLUCOSE 154* 195*  BUN 17 26*  CREATININE 0.97 1.36*  CALCIUM 8.5* 8.5*    COAG Lab Results  Component Value Date   INR 0.96 03/23/2015   INR 1.58* 03/16/2015   INR 1.03 03/03/2015   No results found for: PTT

## 2015-05-26 NOTE — Progress Notes (Signed)
Patient discharged via [facility] at 1350 via stretcher to home on pallative care/ hospice on 15L O2

## 2015-05-26 NOTE — Telephone Encounter (Signed)
Spoke with pts caretaker, Rex to schedule, dpm

## 2015-05-26 NOTE — Progress Notes (Addendum)
   Daily Progress Note   I spoke with Rex today and they still plan to go home today with hospice - Mr. Pursel has so much been wanting to return home. I clarified that his time may be very short - days - when he leaves as he keeps declining. Rex is very upset and has me tell this to numerous family members via telephone. I offered emotional support - they still wish to go home today. Reminded Rex that hospice will be there to assist him with anything.   No charge  Vinie Sill, NP Palliative Medicine Team Pager # [phone] (M-F 8a-5p) Team Phone # [phone] (Nights/Weekends)  05/03/2015, 12:41 PM

## 2015-05-26 NOTE — Progress Notes (Signed)
Patient with increased work of breathing and desaturation on 100% NRB. Lung sounds with rhonchi and crackles.  Dr Candiss Norse notified. Order received for one time dose of IV morphine and lasix. Both given IV.  Patient appears more comfortable. O2 sat on NRB 99%.  Plan to discharge home today with hospice.  Friend "Rex" called and now at bedside.  RN to call if assistance needed.

## 2015-05-30 ENCOUNTER — Ambulatory Visit: Payer: Self-pay | Admitting: Physician Assistant

## 2015-05-30 ENCOUNTER — Other Ambulatory Visit: Payer: Self-pay

## 2015-06-02 DEATH — deceased

## 2015-06-07 ENCOUNTER — Encounter: Payer: Self-pay | Admitting: Vascular Surgery

## 2015-06-08 ENCOUNTER — Encounter: Payer: Self-pay | Admitting: Vascular Surgery

## 2015-06-08 ENCOUNTER — Telehealth: Payer: Self-pay | Admitting: Vascular Surgery

## 2015-06-08 NOTE — Telephone Encounter (Signed)
Pt's care [name], [name], called to say Mark Hurst passed away two weeks ago as of today 09.07.2016. He had an appointment with Dr. Scot Dock today 09.07.2016.

## 2015-06-13 ENCOUNTER — Ambulatory Visit: Payer: Self-pay

## 2015-06-13 ENCOUNTER — Other Ambulatory Visit: Payer: Self-pay

## 2015-07-02 DEATH — deceased

## 2015-08-15 ENCOUNTER — Encounter: Payer: Self-pay | Admitting: Internal Medicine

## 2015-09-28 ENCOUNTER — Other Ambulatory Visit: Payer: Self-pay | Admitting: Nurse Practitioner

## 2016-06-30 IMAGING — CT CT ANGIO CHEST
2 of 9 series · 17 of 46 positions shown · IV contrast (OMNI)
Comparison: 03/23/2015

CLINICAL DATA: Shortness of breath. Lung cancer with metastasis.
Hypercoagulability.

EXAM:
CT ANGIOGRAPHY CHEST WITH CONTRAST
TECHNIQUE: Multidetector CT imaging of the chest was performed using the
standard protocol during bolus administration of intravenous
contrast. Multiplanar CT image reconstructions and MIPs were
obtained to evaluate the vascular anatomy.
CONTRAST:  80mL OMNIPAQUE IOHEXOL 350 MG/ML SOLN

[Series 5: thins · axial · 0.64mm/px · z∈[-249,+11]mm · 14 of 294 slices shown]
[im 17/294  lung]
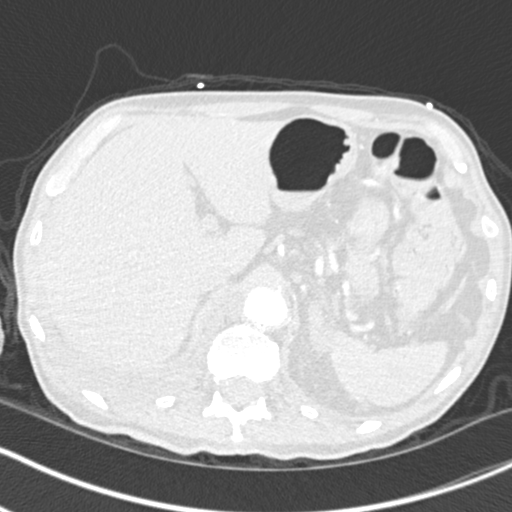
[im 33/294  soft-tissue]
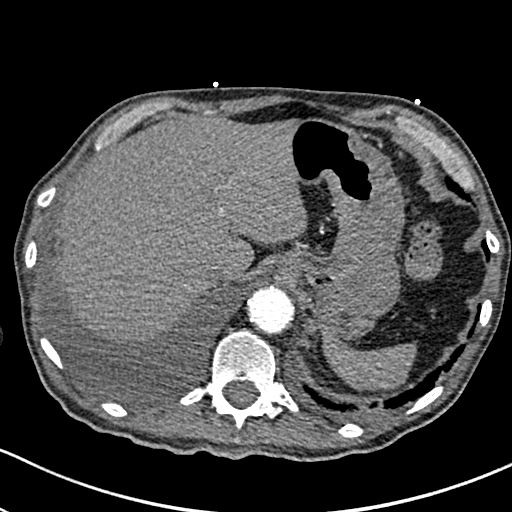
[im 66/294  lung]
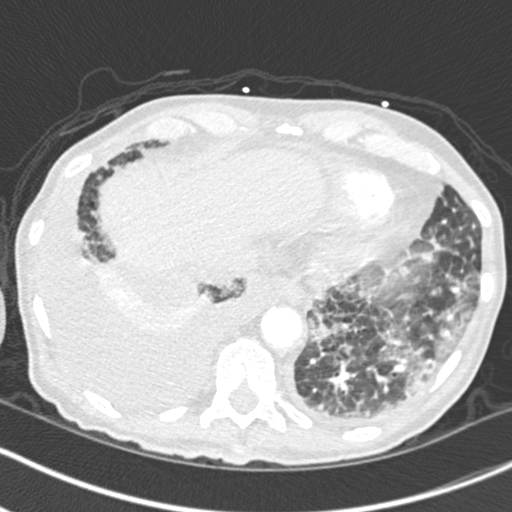
[im 82/294  soft-tissue]
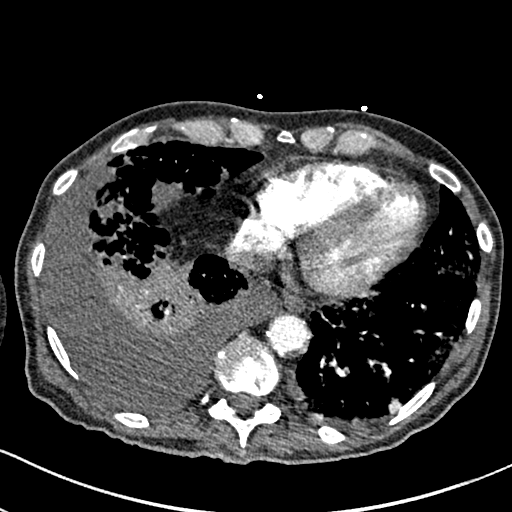
[im 98/294  lung]
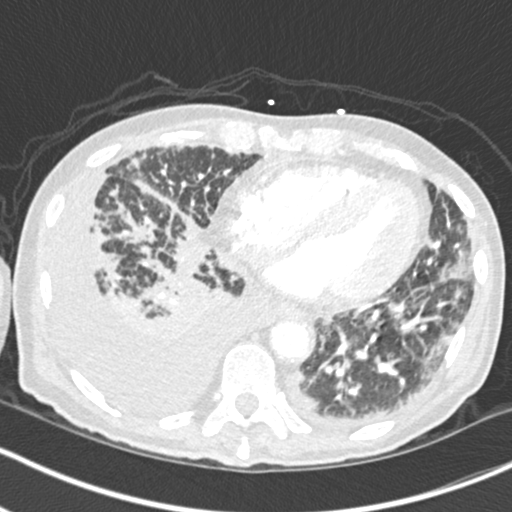
[im 114/294  soft-tissue]
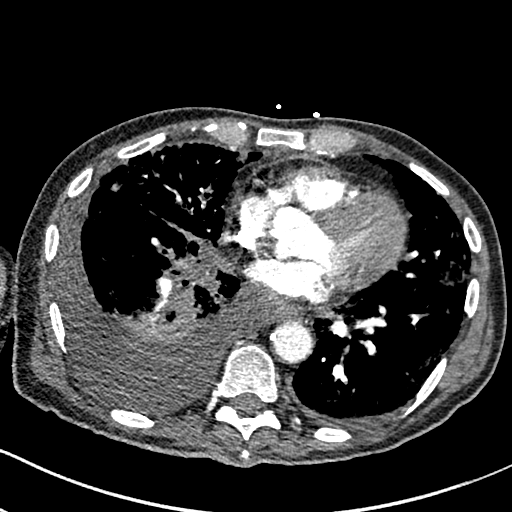
[im 131/294  lung]
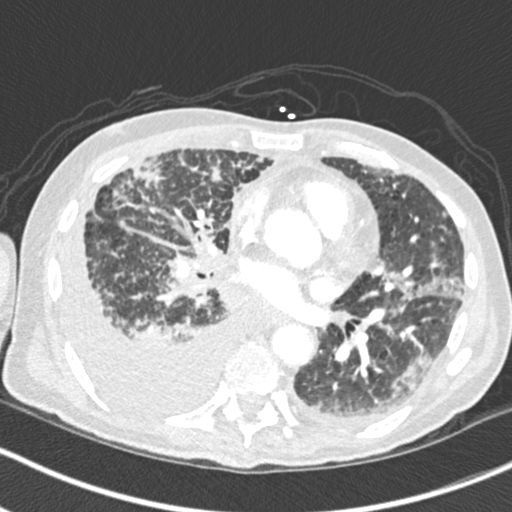
[im 163/294  soft-tissue]
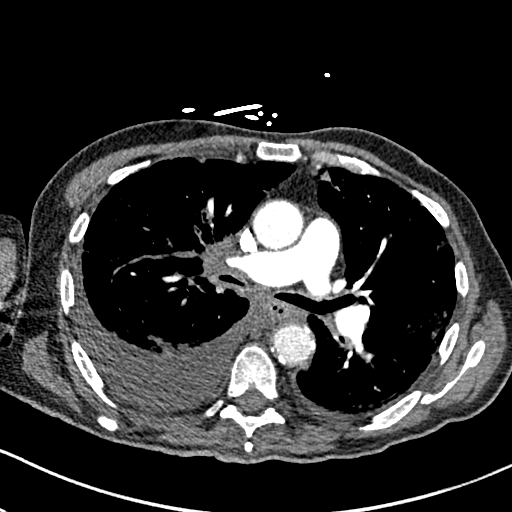
[im 180/294  lung]
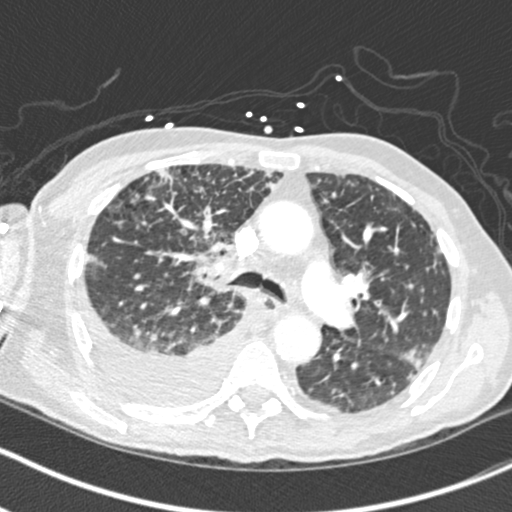
[im 196/294  soft-tissue]
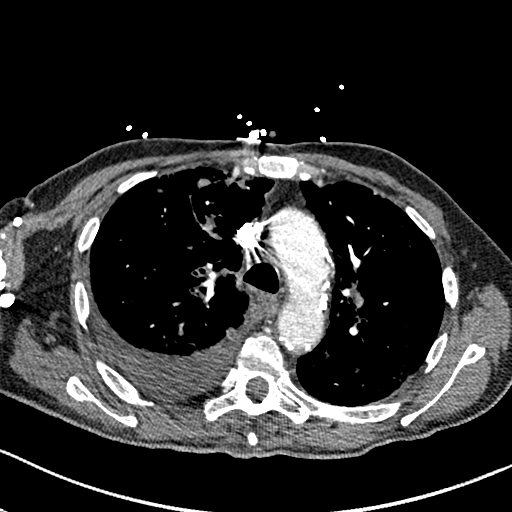
[im 212/294  lung]
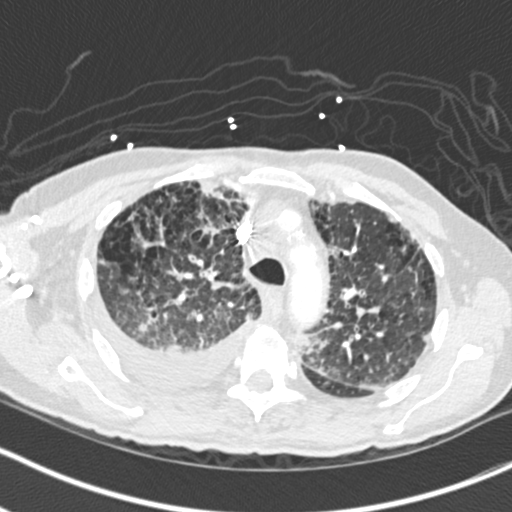
[im 228/294  soft-tissue]
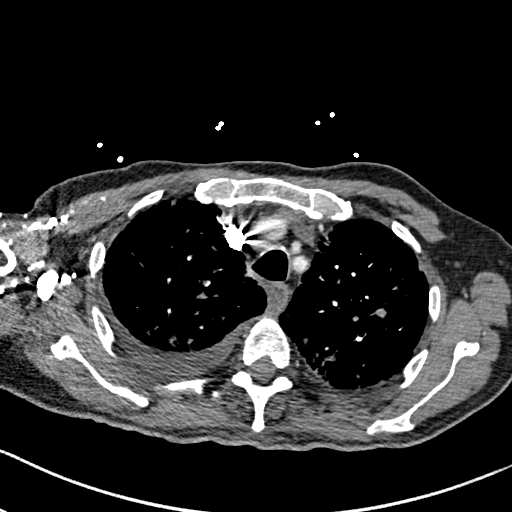
[im 261/294  lung]
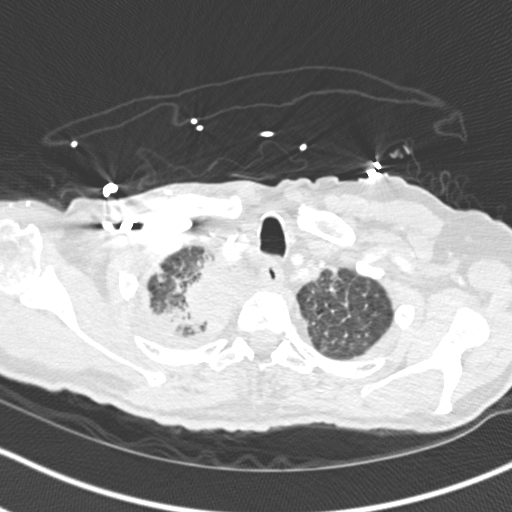
[im 277/294  soft-tissue]
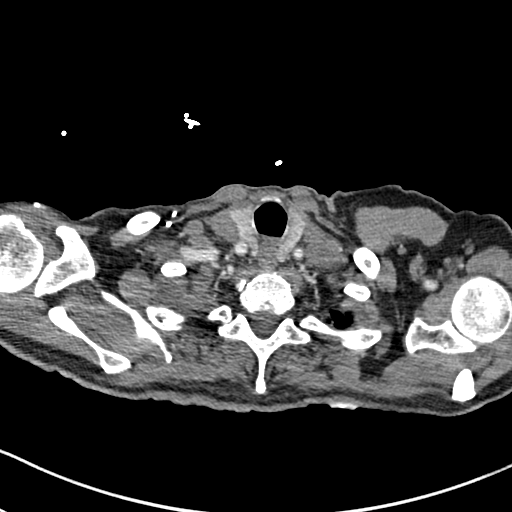

[Series 7: coronal mpr · coronal · 0.59mm/px · 3 of 121 slices shown]
[im 31/121  soft-tissue]
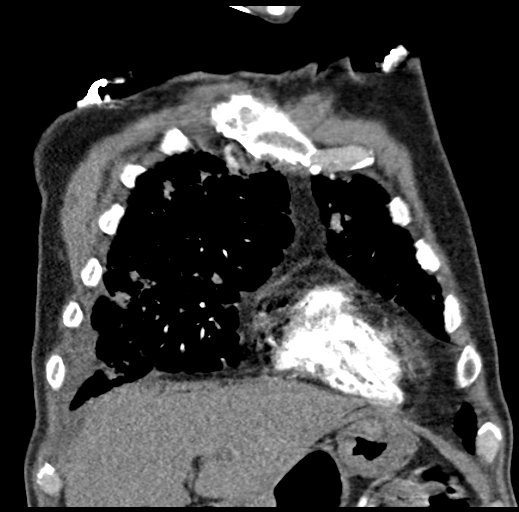
[im 61/121  soft-tissue]
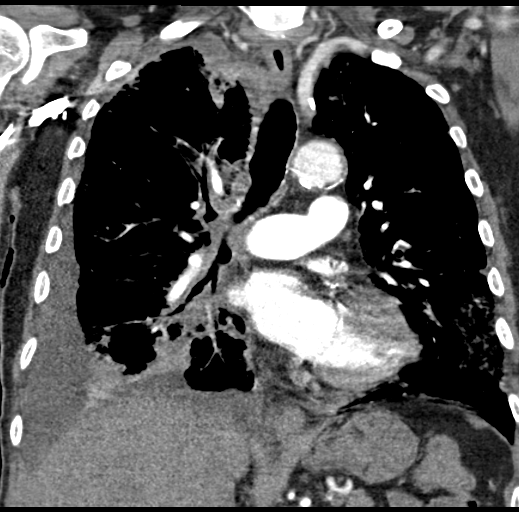
[im 91/121  soft-tissue]
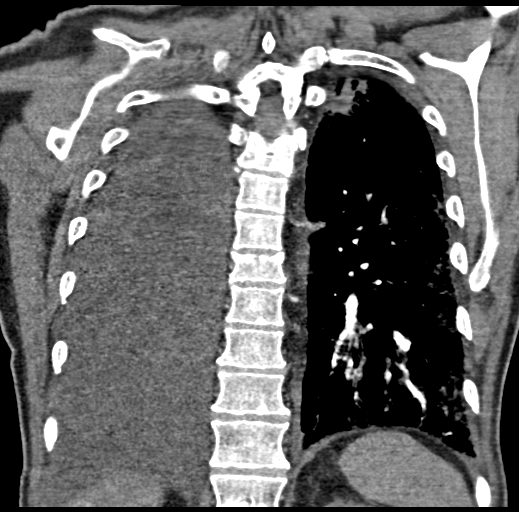

[17 of 46 positions shown; findings below may reference images not displayed]

FINDINGS: Technically adequate study with good opacification of the central
and segmental pulmonary arteries. No focal filling defects
demonstrated. No evidence of significant pulmonary embolus. There is
right hilar lymphadenopathy which causes focal constriction of the
right main pulmonary artery but the vessel remains patent despite
focal stenosis.

Normal heart size. Normal caliber thoracic aorta. Aortic
calcification. No aortic dissection. Coronary artery calcification.
Esophagus is decompressed. Pretracheal lymphadenopathy measures 14
mm short axis dimension. Right hilar lymphadenopathy measures up to
17 mm diameter.

Right upper lung mass is again demonstrated in appears larger than
the prior study, although additional disease is present in the right
lung which makes measurement difficult. The mass appears to measure
about 3.7 x 4.1 cm today. Focal cavitation in the lesion. Lesion
abuts the pleural surface and there is associated pleural
thickening. Pleural invasion is not excluded.

Since the previous study, there is interval development of a large
right pleural effusion and a small left pleural effusion. Interval
development of diffuse patchy interstitial nodular changes
throughout both lungs, probably representing lymphangitic tumor
spread. Diffuse bronchial wall thickening. Diffuse bronchitis with
with diffuse patchy bronchopneumonia not excluded. No pneumothorax.

Included portions of the upper abdominal organs demonstrate
increased nodular soft tissue density in the left suprarenal space,
incompletely included within the study. This measures about 3.6 cm
maximal diameter. With the normal adrenal gland is not demonstrated
in this likely represents a new adrenal gland metastasis. However,
due to the field of view, the it is also possible this is just the
top of the kidney. Visualized portion of the right adrenal gland is
unremarkable in the other upper abdominal organs visualize are
grossly unremarkable. Degenerative changes in the spine. Mild
compression of a mid thoracic vertebra, new since prior study. This
may indicate developing bone metastasis.

Review of the MIP images confirms the above findings.
IMPRESSION: No evidence of significant pulmonary embolus although there is focal
nonocclusive stenosis of the right main pulmonary artery caused by
right hilar lymphadenopathy. Since prior study, there is enlargement
of the right apical mass lesion with small focal cavitation. Pleural
thickening may indicate pleural involvement. New large right pleural
effusion and small left pleural effusion. Diffuse nodular
interstitial pattern throughout both lungs with bronchial wall
thickening. This is likely to represent lymphangitic tumor
metastases but diffuse bronchiolitis and bronchopneumonia is not
excluded. Possible developing left adrenal gland metastasis although
incompletely included on the study. New central endplate compression
of a mid thoracic vertebra a may indicate bone metastasis.
